# Patient Record
Sex: Female | Born: 1960 | Race: White | Hispanic: No | Marital: Married | Smoking: Former smoker
Health system: Southern US, Community
[De-identification: ages and names within clinical notes are randomized; demographics above are authoritative.]

## PROBLEM LIST (undated history)

## (undated) DIAGNOSIS — I059 Rheumatic mitral valve disease, unspecified: Secondary | ICD-10-CM

## (undated) HISTORY — PX: CARDIAC SURGERY: SHX584

## (undated) HISTORY — PX: OTHER SURGICAL HISTORY: SHX169

---

## 2017-03-30 ENCOUNTER — Emergency Department (HOSPITAL_COMMUNITY): Payer: Managed Care, Other (non HMO)

## 2017-03-30 ENCOUNTER — Inpatient Hospital Stay (HOSPITAL_COMMUNITY)
Admission: EM | Admit: 2017-03-30 | Discharge: 2017-04-11 | DRG: 216 | Disposition: A | Discharge status: Home / Self Care | Payer: Managed Care, Other (non HMO) | Attending: Surgery | Admitting: Surgery

## 2017-03-30 ENCOUNTER — Encounter (HOSPITAL_COMMUNITY): Payer: Self-pay | Admitting: Emergency Medicine

## 2017-03-30 ENCOUNTER — Other Ambulatory Visit: Payer: Self-pay

## 2017-03-30 DIAGNOSIS — I481 Persistent atrial fibrillation: Secondary | ICD-10-CM | POA: Diagnosis not present

## 2017-03-30 DIAGNOSIS — I342 Nonrheumatic mitral (valve) stenosis: Secondary | ICD-10-CM | POA: Diagnosis not present

## 2017-03-30 DIAGNOSIS — D696 Thrombocytopenia, unspecified: Secondary | ICD-10-CM | POA: Diagnosis present

## 2017-03-30 DIAGNOSIS — R0602 Shortness of breath: Secondary | ICD-10-CM | POA: Diagnosis not present

## 2017-03-30 DIAGNOSIS — E119 Type 2 diabetes mellitus without complications: Secondary | ICD-10-CM | POA: Diagnosis present

## 2017-03-30 DIAGNOSIS — D62 Acute posthemorrhagic anemia: Secondary | ICD-10-CM | POA: Diagnosis not present

## 2017-03-30 DIAGNOSIS — F17210 Nicotine dependence, cigarettes, uncomplicated: Secondary | ICD-10-CM | POA: Diagnosis present

## 2017-03-30 DIAGNOSIS — I099 Rheumatic heart disease, unspecified: Secondary | ICD-10-CM

## 2017-03-30 DIAGNOSIS — J9811 Atelectasis: Secondary | ICD-10-CM | POA: Diagnosis present

## 2017-03-30 DIAGNOSIS — Z952 Presence of prosthetic heart valve: Secondary | ICD-10-CM

## 2017-03-30 DIAGNOSIS — I480 Paroxysmal atrial fibrillation: Secondary | ICD-10-CM | POA: Diagnosis not present

## 2017-03-30 DIAGNOSIS — I4892 Unspecified atrial flutter: Secondary | ICD-10-CM

## 2017-03-30 DIAGNOSIS — I361 Nonrheumatic tricuspid (valve) insufficiency: Secondary | ICD-10-CM | POA: Diagnosis not present

## 2017-03-30 DIAGNOSIS — I350 Nonrheumatic aortic (valve) stenosis: Secondary | ICD-10-CM | POA: Diagnosis not present

## 2017-03-30 DIAGNOSIS — I050 Rheumatic mitral stenosis: Secondary | ICD-10-CM

## 2017-03-30 DIAGNOSIS — I5033 Acute on chronic diastolic (congestive) heart failure: Secondary | ICD-10-CM | POA: Diagnosis present

## 2017-03-30 DIAGNOSIS — I351 Nonrheumatic aortic (valve) insufficiency: Secondary | ICD-10-CM | POA: Diagnosis not present

## 2017-03-30 DIAGNOSIS — I080 Rheumatic disorders of both mitral and aortic valves: Secondary | ICD-10-CM | POA: Diagnosis present

## 2017-03-30 DIAGNOSIS — I4891 Unspecified atrial fibrillation: Secondary | ICD-10-CM | POA: Diagnosis present

## 2017-03-30 DIAGNOSIS — I272 Pulmonary hypertension, unspecified: Secondary | ICD-10-CM | POA: Diagnosis present

## 2017-03-30 DIAGNOSIS — Z01810 Encounter for preprocedural cardiovascular examination: Secondary | ICD-10-CM | POA: Diagnosis not present

## 2017-03-30 DIAGNOSIS — I051 Rheumatic mitral insufficiency: Secondary | ICD-10-CM

## 2017-03-30 DIAGNOSIS — I340 Nonrheumatic mitral (valve) insufficiency: Secondary | ICD-10-CM | POA: Diagnosis not present

## 2017-03-30 DIAGNOSIS — I482 Chronic atrial fibrillation: Secondary | ICD-10-CM | POA: Diagnosis not present

## 2017-03-30 DIAGNOSIS — J939 Pneumothorax, unspecified: Secondary | ICD-10-CM

## 2017-03-30 DIAGNOSIS — Z8249 Family history of ischemic heart disease and other diseases of the circulatory system: Secondary | ICD-10-CM | POA: Diagnosis not present

## 2017-03-30 HISTORY — DX: Rheumatic mitral valve disease, unspecified: I05.9

## 2017-03-30 LAB — CBC
HCT: 37.2 % (ref 36.0–46.0)
Hemoglobin: 12.2 g/dL (ref 12.0–15.0)
MCH: 30.4 pg (ref 26.0–34.0)
MCHC: 32.8 g/dL (ref 30.0–36.0)
MCV: 92.8 fL (ref 78.0–100.0)
Platelets: 235 10*3/uL (ref 150–400)
RBC: 4.01 MIL/uL (ref 3.87–5.11)
RDW: 13.3 % (ref 11.5–15.5)
WBC: 8.5 10*3/uL (ref 4.0–10.5)

## 2017-03-30 LAB — BASIC METABOLIC PANEL
Anion gap: 13 (ref 5–15)
BUN: 20 mg/dL (ref 6–20)
CO2: 18 mmol/L — ABNORMAL LOW (ref 22–32)
Calcium: 9 mg/dL (ref 8.9–10.3)
Chloride: 105 mmol/L (ref 101–111)
Creatinine, Ser: 0.98 mg/dL (ref 0.44–1.00)
GFR calc Af Amer: >60 mL/min (ref >60)
GFR calc non Af Amer: >60 mL/min (ref >60)
Glucose, Bld: 138 mg/dL — ABNORMAL HIGH (ref 65–99)
Potassium: 4.2 mmol/L (ref 3.5–5.1)
Sodium: 136 mmol/L (ref 135–145)

## 2017-03-30 LAB — MAGNESIUM: Magnesium: 2 mg/dL (ref 1.7–2.4)

## 2017-03-30 LAB — HEMOGLOBIN A1C
Hgb A1c MFr Bld: 5.3 % (ref 4.8–5.6)
Mean Plasma Glucose: 105.41 mg/dL

## 2017-03-30 LAB — I-STAT BETA HCG BLOOD, ED (MC, WL, AP ONLY): I-stat hCG, quantitative: <5 m[IU]/mL (ref <5)

## 2017-03-30 LAB — HEPARIN LEVEL (UNFRACTIONATED): Heparin Unfractionated: 0.15 IU/mL — ABNORMAL LOW (ref 0.30–0.70)

## 2017-03-30 LAB — TSH: TSH: 3.8 u[IU]/mL (ref 0.350–4.500)

## 2017-03-30 LAB — I-STAT TROPONIN, ED: Troponin i, poc: 0.01 ng/mL (ref 0.00–0.08)

## 2017-03-30 MED ORDER — SODIUM CHLORIDE 0.9 % IV BOLUS (SEPSIS)
500.0000 mL | Freq: Once | INTRAVENOUS | Status: AC
  Administered 2017-03-30: 500 mL via INTRAVENOUS

## 2017-03-30 MED ORDER — ACETAMINOPHEN 325 MG PO TABS
650.0000 mg | ORAL_TABLET | ORAL | Status: DC | PRN
  Administered 2017-03-31: 650 mg via ORAL
  Filled 2017-03-30: qty 2

## 2017-03-30 MED ORDER — FUROSEMIDE 10 MG/ML IJ SOLN
20.0000 mg | Freq: Two times a day (BID) | INTRAMUSCULAR | Status: DC
  Administered 2017-03-30 – 2017-03-31 (×3): 20 mg via INTRAVENOUS
  Filled 2017-03-30 (×3): qty 2

## 2017-03-30 MED ORDER — ONDANSETRON HCL 4 MG/2ML IJ SOLN: 4.0000 mg | Freq: Four times a day (QID) | INTRAMUSCULAR | Status: DC | PRN

## 2017-03-30 MED ORDER — DILTIAZEM HCL-DEXTROSE 100-5 MG/100ML-% IV SOLN (PREMIX)
5.0000 mg/h | INTRAVENOUS | Status: DC
  Administered 2017-03-30: 5 mg/h via INTRAVENOUS
  Filled 2017-03-30 (×2): qty 100

## 2017-03-30 MED ORDER — DILTIAZEM LOAD VIA INFUSION
10.0000 mg | Freq: Once | INTRAVENOUS | Status: AC
  Administered 2017-03-30: 10 mg via INTRAVENOUS
  Filled 2017-03-30: qty 10

## 2017-03-30 MED ORDER — HEPARIN (PORCINE) IN NACL 100-0.45 UNIT/ML-% IJ SOLN
1250.0000 [IU]/h | INTRAMUSCULAR | Status: DC
Start: 2017-03-30 — End: 2017-03-31

## 2017-03-30 MED ORDER — HEPARIN BOLUS VIA INFUSION
3000.0000 [IU] | Freq: Once | INTRAVENOUS | Status: AC
Start: 2017-03-30 — End: 2017-03-30
  Administered 2017-03-30: 3000 [IU] via INTRAVENOUS
  Filled 2017-03-30: qty 3000

## 2017-03-30 MED ORDER — HEPARIN (PORCINE) IN NACL 100-0.45 UNIT/ML-% IJ SOLN
850.0000 [IU]/h | INTRAMUSCULAR | Status: DC
  Administered 2017-03-30: 850 [IU]/h via INTRAVENOUS
  Filled 2017-03-30 (×2): qty 250

## 2017-03-30 NOTE — ED Provider Notes (Signed)
[facility] EMERGENCY DEPARTMENT Provider Note   CSN: [id number] Arrival date & time: 03/30/17  1146     History   Chief Complaint Chief Complaint  Patient presents with  . Shortness of Breath    HPI Sharon Figueroa is a 57 y.o. female.  HPI Patient presents with shortness of breath for the last around 5 days.  Has had a little bit of a cough with no production.  Some chest heaviness.  Seen at urgent care and sent here for new onset A. fib.  No swelling in her legs.  No fevers.  States she is been more fatigued.  Has had a dull chest pain.  Does not really see a doctor but states she has no real medical problems.  No recent weight loss. History reviewed. No pertinent past medical history.  There are no active problems to display for this patient.   History reviewed. No pertinent surgical history.  OB History    No data available       Home Medications    Prior to Admission medications   Not on File    Family History No family history on file.  Social History Social History   Tobacco Use  . Smoking status: Current Every Day Smoker    Packs/day: 1.00    Types: Cigarettes  . Smokeless tobacco: Never Used  Substance Use Topics  . Alcohol use: No    Frequency: Never  . Drug use: No     Allergies   Patient has no known allergies.   Review of Systems Review of Systems  Constitutional: Negative for fever.  HENT: Negative for congestion.   Eyes: Negative for photophobia.  Respiratory: Positive for cough and shortness of breath.   Cardiovascular: Negative for chest pain.  Gastrointestinal: Negative for abdominal pain.  Genitourinary: Negative for flank pain.  Musculoskeletal: Negative for back pain.  Neurological: Negative for syncope.  Psychiatric/Behavioral: Negative for confusion.     Physical Exam Updated Vital Signs BP 116/67   Pulse 87   Temp 97.6 F (36.4 C) (Oral)   Resp (!) 22   Ht 5\' 6"  (1.676 m)   Wt 60.8 kg (134  lb)   SpO2 94%   BMI 21.63 kg/m   Physical Exam  Constitutional: She appears well-developed.  HENT:  Head: Normocephalic.  Eyes: EOM are normal.  Cardiovascular:  Irregular tachycardia  Pulmonary/Chest: Effort normal. She exhibits no mass and no tenderness.  Mildly harsh breath sounds  Abdominal: There is no tenderness.  Musculoskeletal:       Right lower leg: She exhibits edema.       Left lower leg: She exhibits edema.  Mild bilateral lower extremity pitting edema  Neurological: She is alert.  Skin: Skin is warm. Capillary refill takes less than 2 seconds.     ED Treatments / Results  Labs (all labs ordered are listed, but only abnormal results are displayed) Labs Reviewed  BASIC METABOLIC PANEL - Abnormal; Notable for the following components:      Result Value   CO2 18 (*)    Glucose, Bld 138 (*)    All other components within normal limits  CBC  TSH  MAGNESIUM  I-STAT TROPONIN, ED  I-STAT BETA HCG BLOOD, ED ([facility], [facility], [facility] ONLY)    EKG  EKG Interpretation  Date/Time:  Saturday March 30 2017 11:55:36 EST Ventricular Rate:  141 PR Interval:    QRS Duration: 86 QT Interval:  274 QTC Calculation: 419 R Axis:  102 Text Interpretation:  Atrial flutter with rapid ventricular response with premature ventricular or aberrantly conducted complexes Rightward axis Minimal voltage criteria for LVH, may be normal variant Septal infarct , age undetermined Abnormal ECG No old tracing to compare Reconfirmed by Benjiman CorePickering, Mikeila Burgen [phone]([id number]) on 03/30/2017 12:48:23 PM       Radiology Dg Chest 2 View  Result Date: 03/30/2017 CLINICAL DATA:  Shortness of breath.  Chest tightness. EXAM: CHEST  2 VIEW COMPARISON:  None. FINDINGS: Cardiomegaly. The hila and mediastinum are normal. Increased interstitial markings in the lungs, most prominent in the bases. Small effusions based on the lateral view. No other acute abnormalities. IMPRESSION: Pulmonary edema.  Small effusions.  Electronically Signed   By: Gerome Samavid  Williams III M.D   On: 03/30/2017 12:34    Procedures Procedures (including critical care time)  Medications Ordered in ED Medications  diltiazem (CARDIZEM) 1 mg/mL load via infusion 10 mg (10 mg Intravenous Bolus from Bag 03/30/17 1245)    And  diltiazem (CARDIZEM) 100 mg in dextrose 5% 100mL (1 mg/mL) infusion (5 mg/hr Intravenous New Bag/Given 03/30/17 1246)  furosemide (LASIX) injection 20 mg (not administered)  sodium chloride 0.9 % bolus 500 mL (0 mLs Intravenous Stopped 03/30/17 1352)     Initial Impression / Assessment and Plan / ED Course  I have reviewed the triage vital signs and the nursing notes.  Pertinent labs & imaging results that were available during my care of the patient were reviewed by me and considered in my medical decision making (see chart for details).     Patient with atrial flutter with RVR.  New onset and likely began around 6 days ago.  Not a candidate for urgent cardioversion due to this.  Appears to be somewhat volume overloaded.  ChadsVASC score of 1 for age.. Patient is requiring IV Cardizem for rate control.  Will admit to cardiologist.  CRITICAL CARE Performed by: Benjiman CoreNathan Tien Aispuro Total critical care time:30 minutes Critical care time was exclusive of separately billable procedures and treating other patients. Critical care was necessary to treat or prevent imminent or life-threatening deterioration. Critical care was time spent personally by me on the following activities: development of treatment plan with patient and/or surrogate as well as nursing, discussions with consultants, evaluation of patient's response to treatment, examination of patient, obtaining history from patient or surrogate, ordering and performing treatments and interventions, ordering and review of laboratory studies, ordering and review of radiographic studies, pulse oximetry and re-evaluation of patient's condition.  Final Clinical  Impressions(s) / ED Diagnoses   Final diagnoses:  Atrial flutter with rapid ventricular response [facility](HCC)    ED Discharge Orders    None       Benjiman CorePickering, Micco Bourbeau, MD 03/30/17 1448

## 2017-03-30 NOTE — Plan of Care (Signed)
  Pain Managment: General experience of comfort will improve 03/30/2017 1743 - Completed/Met by Shanon Rosser, RN

## 2017-03-30 NOTE — ED Notes (Signed)
Ordered dinner tray for pt.  

## 2017-03-30 NOTE — H&P (Signed)
Cardiology Consultation:   Patient ID: Sharon Figueroa; [id number]; 04/21/1960   Admit date: 03/30/2017 Date of Consult: 03/30/2017  Primary Care Provider: Patient, No Pcp Per Primary Cardiologist: New; Dr Jens Somrenshaw   Patient Profile:   Sharon Figueroa is a 57 y.o. female with new onset atrial fibrillation.  History of Present Illness:   Patient states that she has had a murmur in the past and years ago was followed with echocardiograms for mitral regurgitation.  [place] and she has not had an echo in greater than 10 years.  Over the preceding 4 days she notes worsening dyspnea on exertion, orthopnea, PND and chest heaviness with lying flat.  No pedal edema, palpitations or syncope.  She presented to the emergency room and found to be in atrial fibrillation and cardiology asked to evaluate.  Prior to the preceding 4 days she was asymptomatic.  Past Medical History:  Diagnosis Date  . 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 No prior surgery        Inpatient Medications: Scheduled Meds: . furosemide  20 mg Intravenous BID   Continuous Infusions: . diltiazem (CARDIZEM) infusion 5 mg/hr (03/30/17 1246)   PRN Meds:   Allergies:   No Known Allergies  Social History:   Social History   Socioeconomic History  . Marital status: Single    Spouse name: Not on file  . Number of children: Not on file  . Years of education: Not on file  . Highest education level: Not on file  Social Needs  . Financial resource strain: Not on file  . Food insecurity - worry: Not on file  . Food insecurity - inability: Not on file  . Transportation needs - medical: Not on file  . Transportation needs - non-medical: Not on file  Occupational History  . Not on file  Tobacco Use  . Smoking status: Current Every Day Smoker    Packs/day: 1.00    Types: Cigarettes  . Smokeless tobacco: Never Used  Substance  and Sexual Activity  . Alcohol use: No    Frequency: Never  . Drug use: No  . Sexual activity: Not on file  Other Topics Concern  . Not on file  Social History Narrative  . Not on file    Family History:    Family History  Problem Relation Age of Onset  . CVA Father   . Heart attack Father      ROS:  Please see the history of present illness.  Patient denies fevers, chills, productive cough, hemoptysis, dysphagia, melena or hematochezia. All other ROS reviewed and negative.     Physical Exam/Data:   Vitals:   03/30/17 1234 03/30/17 1300 03/30/17 1315 03/30/17 1345  BP: (!) 94/58 106/67 (!) 97/58 116/67  Pulse: (!) 48 91 96 87  Resp: (!) 22 (!) 24 (!) 22 (!) 22  Temp:      TempSrc:      SpO2: 100% 96% 97% 94%  Weight:      Height:        Intake/Output Summary (Last 24 hours) at 03/30/2017 1513 Last data filed at 03/30/2017 1352 Gross per 24 hour  Intake 500 ml  Output -  Net 500 ml   Filed Weights   03/30/17 1200  Weight: 134 lb (60.8 kg)   Body mass index is 21.63 kg/m.  General:  Well nourished, well developed, in  no acute distress HEENT: normal Lymph: no adenopathy Neck: no JVD Endocrine:  No thryomegaly Vascular: No carotid bruits; FA pulses 2+ bilaterally without bruits  Cardiac:  normal S1, S2; irregular, tachycardic.  No murmur. Lungs: Mild basilar crackles. Abd: soft, nontender, no hepatomegaly  Ext: no edema Musculoskeletal:  No deformities, BUE and BLE strength normal and equal Skin: warm and dry  Neuro:  CNs 2-12 intact, no focal abnormalities noted Psych:  Normal affect   EKG:  The EKG was personally reviewed and demonstrates: Atrial fibrillation with rapid ventricular response, right axis deviation, left ventricular hypertrophy, nonspecific ST changes.   Laboratory Data:  Chemistry Recent Labs  Lab 03/30/17 1207  NA 136  K 4.2  CL 105  CO2 18*  GLUCOSE 138*  BUN 20  CREATININE 0.98  CALCIUM 9.0  GFRNONAA >60  GFRAA >60    ANIONGAP 13    Hematology Recent Labs  Lab 03/30/17 1207  WBC 8.5  RBC 4.01  HGB 12.2  HCT 37.2  MCV 92.8  MCH 30.4  MCHC 32.8  RDW 13.3  PLT 235    Recent Labs  Lab 03/30/17 1216  TROPIPOC 0.01     Radiology/Studies:  Dg Chest 2 View  Result Date: 03/30/2017 CLINICAL DATA:  Shortness of breath.  Chest tightness. EXAM: CHEST  2 VIEW COMPARISON:  None. FINDINGS: Cardiomegaly. The hila and mediastinum are normal. Increased interstitial markings in the lungs, most prominent in the bases. Small effusions based on the lateral view. No other acute abnormalities. IMPRESSION: Pulmonary edema.  Small effusions. Electronically Signed   By: Gerome Sam III M.D   On: 03/30/2017 12:34    Assessment and Plan:   1. New-onset atrial fibrillation-patient presents with newly diagnosed atrial fibrillation.  We will control heart rate with Cardizem.  Check TSH and echocardiogram.  Embolic risk factors include female sex and congestive heart failure.  Begin IV heparin and will likely need long-term apixaban.  If we can control heart rate and symptoms improve we could consider cardioversion 4 weeks after therapeutic anticoagulation.  Otherwise would need to consider TEE guided cardioversion.  At 2. History of mitral regurgitation-patient states she has had mitral regurgitation which was followed in the past with serial echocardiograms.  She has not had one in 10 years and has not followed up with a cardiologist.  I do not hear a significant murmur on examination.  However I am concerned that mitral regurgitation may be significant or causing left atrial enlargement and atrial fibrillation.  We will arrange an echocardiogram to further assess.  Can consider TEE if needed. 3. Acute diastolic congestive heart failure-patient with volume excess.  Will diurese with Lasix 20 mg IV twice daily.  Follow renal function closely.  CHF likely secondary to new onset atrial fibrillation. 4. Tobacco abuse-patient  counseled on discontinuing.   For questions or updates, please contact [facility] Please consult [email or web address] for contact info under Cardiology/STEMI.   Signed, Olga Millers, MD  03/30/2017 3:13 PM

## 2017-03-30 NOTE — Progress Notes (Signed)
ANTICOAGULATION CONSULT NOTE - Initial Consult  Pharmacy Consult for Heparin Indication: atrial fibrillation  No Known Allergies  Patient Measurements: Height: 5\' 6"  (167.6 cm) Weight: 134 lb (60.8 kg) IBW/kg (Calculated) : 59.3 Heparin Dosing Weight: 60 kg  Vital Signs: Temp: 97.6 F (36.4 C) (01/26 1158) Temp Source: Oral (01/26 1158) BP: 106/59 (01/26 1530) Pulse Rate: 85 (01/26 1530)  Labs: Recent Labs    03/30/17 1207  HGB 12.2  HCT 37.2  PLT 235  CREATININE 0.98    Estimated Creatinine Clearance: 58.6 mL/min (by C-G formula based on SCr of 0.98 mg/dL).   Medical History: Past Medical History:  Diagnosis Date  . Mitral valve disorder    "leaky" valve diagnosed 30+ yr ago    Assessment: Sharon Figueroa who presented on 1/26 with increased SOB with exertion and was found to have new Afib. Cardiology consulted and considered TEE-DCCV in 4 weeks after anticoagulation. Pharmacy consulted to start Heparin for anticoagulation with plans like to eventually transition to Apixaban.   The patient was not on any medications PTA and has no hx CVA or recent surgeries. Baseline CBC wnl.   Goal of Therapy:  Heparin level 0.3-0.7 units/ml Monitor platelets by anticoagulation protocol: Yes   Plan:  1. Heparin 3000 units bolus x 1 2. Start Heparin at a drip rate of 850 units/hr (8.5 ml/hr) 3. Daily HL, CBC 4. Will continue to monitor for any signs/symptoms of bleeding and will follow up with heparin level in 6 hours   Thank you for allowing pharmacy to be a part of this patient's care.  Georgina PillionElizabeth Holten Spano, PharmD, BCPS Clinical Pharmacist Pager: [phone] Clinical phone for 03/30/2017 from 7a-3:30p: [phone] If after 3:30p, please call main pharmacy at: [phone] 03/30/2017 3:56 PM

## 2017-03-30 NOTE — Progress Notes (Signed)
ANTICOAGULATION CONSULT NOTE - Initial Consult  Pharmacy Consult for Heparin Indication: atrial fibrillation  No Known Allergies  Patient Measurements: Height: 5\' 6"  (167.6 cm) Weight: 132 lb 8 oz (60.1 kg) IBW/kg (Calculated) : 59.3 Heparin Dosing Weight: 60 kg  Vital Signs: Temp: 99.2 F (37.3 C) (01/26 2131) Temp Source: Oral (01/26 2131) BP: 97/56 (01/26 2131) Pulse Rate: 83 (01/26 1735)  Labs: Recent Labs    03/30/17 1207 03/30/17 2225  HGB 12.2  --   HCT 37.2  --   PLT 235  --   HEPARINUNFRC  --  0.15*  CREATININE 0.98  --     Estimated Creatinine Clearance: 58.6 mL/min (by C-G formula based on SCr of 0.98 mg/dL).   Medical History: Past Medical History:  Diagnosis Date  . Mitral valve disorder    "leaky" valve diagnosed 30+ yr ago    Assessment: [id number] YOF who presented on 1/26 with increased SOB with exertion and was found to have new Afib. Cardiology consulted and considered TEE-DCCV in 4 weeks after anticoagulation. Pharmacy consulted to start Heparin for anticoagulation with plans like to eventually transition to Apixaban.   Initial heparin level is SUBtherapeutic.  Goal of Therapy:  Heparin level 0.3-0.7 units/ml Monitor platelets by anticoagulation protocol: Yes   Plan:   Increase heparin to 1050 units/hr  Daily HL, CBC  Check level in 6 hours   Baldemar FridayMasters, Azaryah Heathcock M  03/30/2017 11:14 PM

## 2017-03-30 NOTE — H&P (Signed)
Cardiology Admission History and Physical:   Patient ID: Adiya Selmer; MRN: [id number]; DOB: 1960-10-19   Admission date: 03/30/2017  Primary Care Provider: None Primary Cardiologist: New to [facility]; Dr. Jens Som Primary Electrophysiologist:  None  Chief Complaint:  SOB   Patient Profile:   Tanyiah Laurich is a 57 y.o. female with a history of a "leaky" mitral valve, presents to [facility] ED with complaints of SOB x4 days. Found to be in Afib RVR. Admitted to cardiology for further management.  History of Present Illness:   Ms. Gully is a 57 y.o. female with a history of a "leaky" mitral valve, presents with progressively worsening SOB for the past 4 days.  She states she was in her usual state of health until 4 days ago when she noticed DOE. She states her SOB got progressively worse over the next few days, to the point that she was dyspneic with minimal activity. On the evening of 03/29/17, she felt SOB at rest with associated orthopnea and chest tightness prompting her to present to an [facility] the following morning. She was found to have an elevated HR and recommended to present to the ED. She denied feeling like her heart was racing or palpitations. Prior to these events, she denies CP, DOE, orthopnea, PND, LE edema, palpitations. She denies chronic medical problems including HTN, HLD, DM, stroke, MI, thyroid disorders. She reports a history of a "leaky" mitral valve, diagnosed >41yr ago during a w/u for syncope, which has not been evaluated in over 10 years. She has not been evaluated by a medical provider since moving to [place] 13 years ago.   ROS negative for bleeding disorders, hematochezia, melena, hematuria, abdominal pain, recent URI, fevers.   ED course: HR max 135, BP stable, RR elevated, satting okay on RA. Labs notable for normal CBC, Cr 0.98, glucose 138, electrolytes wnl, trop negative x1. EKG with Afib RVR, rate 141. CXR with pulmonary edema. Patient started on a diltiazem gtt.  Admitted to cardiology for further management of afib and pulmonary edema.   Past Medical History:  Diagnosis Date  . 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 No prior surgery       Medications Prior to Admission: Prior to Admission medications   Not on File     Allergies:   No Known Allergies  Social History:   Social History   Socioeconomic History  . Marital status: Married    Spouse name: Not on file  . Number of children: 3  . Years of education: Not on file  . Highest education level: Not on file  Social Needs  . Financial resource strain: Not on file  . Food insecurity - worry: Not on file  . Food insecurity - inability: Not on file  . Transportation needs - medical: Not on file  . Transportation needs - non-medical: Not on file  Occupational History  . Not on file  Tobacco Use  . Smoking status: Current Every Day Smoker    Packs/day: 1.00    Types: Cigarettes  . Smokeless tobacco: Never Used  Substance and Sexual Activity  . Alcohol use: No    Frequency: Never  . Drug use: No  . Sexual activity: Not on file  Other Topics Concern  . Not on file  Social History Narrative  . Not on file    Family History:   The patient's family history includes  CVA in her father; Heart attack in her father.    ROS:  Please see the history of present illness.  All other ROS reviewed and negative.     Physical Exam/Data:   Vitals:   03/30/17 1234 03/30/17 1300 03/30/17 1315 03/30/17 1345  BP: (!) 94/58 106/67 (!) 97/58 116/67  Pulse: (!) 48 91 96 87  Resp: (!) 22 (!) 24 (!) 22 (!) 22  Temp:      TempSrc:      SpO2: 100% 96% 97% 94%  Weight:      Height:        Intake/Output Summary (Last 24 hours) at 03/30/2017 1517 Last data filed at 03/30/2017 1352 Gross per 24 hour  Intake 500 ml  Output -  Net 500 ml   Filed Weights   03/30/17 1200  Weight: 134 lb (60.8 kg)   Body mass  index is 21.63 kg/m.  General:  Well nourished, well developed, laying in bed in no acute distress HEENT: normal Neck: no JVD Endocrine:  No thryomegaly Vascular: No carotid bruits; distal pulses 2+ b/l Cardiac:  normal S1, S2; irregular rate/rhythm, no murmur/gallops/rubs appreciated Lungs:  Crackles at bases b/l, no wheezing or rhonchi  Abd: soft, nontender, no hepatomegaly  Ext: no edema Musculoskeletal:  No deformities, BUE and BLE strength normal and equal Skin: warm and dry  Neuro:  CNs 2-12 intact, no focal abnormalities noted Psych:  Normal affect   EKG:  The ECG that was done 03/30/17 was personally reviewed and demonstrates Atrial fibrillation with RVR, rate 141.   Telemetry: Afib with rate 90s-130s  Relevant CV Studies: Echo pending  Laboratory Data:  Chemistry Recent Labs  Lab 03/30/17 1207  NA 136  K 4.2  CL 105  CO2 18*  GLUCOSE 138*  BUN 20  CREATININE 0.98  CALCIUM 9.0  GFRNONAA >60  GFRAA >60  ANIONGAP 13    No results for input(s): PROT, ALBUMIN, AST, ALT, ALKPHOS, BILITOT in the last 168 hours. Hematology Recent Labs  Lab 03/30/17 1207  WBC 8.5  RBC 4.01  HGB 12.2  HCT 37.2  MCV 92.8  MCH 30.4  MCHC 32.8  RDW 13.3  PLT 235   Cardiac EnzymesNo results for input(s): TROPONINI in the last 168 hours.  Recent Labs  Lab 03/30/17 1216  TROPIPOC 0.01    BNPNo results for input(s): BNP, PROBNP in the last 168 hours.  DDimer No results for input(s): DDIMER in the last 168 hours.  Radiology/Studies:  Dg Chest 2 View  Result Date: 03/30/2017 CLINICAL DATA:  Shortness of breath.  Chest tightness. EXAM: CHEST  2 VIEW COMPARISON:  None. FINDINGS: Cardiomegaly. The hila and mediastinum are normal. Increased interstitial markings in the lungs, most prominent in the bases. Small effusions based on the lateral view. No other acute abnormalities. IMPRESSION: Pulmonary edema.  Small effusions. Electronically Signed   By: Gerome Sam III M.D   On:  03/30/2017 12:34    Assessment and Plan:   1. Atrial fibrillation: patient with 4 days of progressive SOB associated with chest pressure and orthopnea. Presented to [facility] 03/30/17, found to be in afib RVR, and recommended to present to ED.  - EKG with Afib RVR w rate 140s - TSH pending2 - This patients CHA2DS2-VASc Score and unadjusted Ischemic Stroke Rate (% per year) is equal to 0.6 % stroke rate/year from a score of 1, possibly higher pending echocardiogram and HgbA1C Above score calculated as 1 point each if present [CHF, HTN,  DM, Vascular=MI/PAD/Aortic Plaque, Age if 2165-74, or Female] Above score calculated as 2 points each if present [Age > 75, or Stroke/TIA/TE] - Will check echo to evaluate for possible structural abnormalities which may have contributed to her presentation - Will check HgbA1C for risk stratification - Start heparin gtt per pharmacy - will likely transition to DOAC at discharge.  2. Volume overload: patient reports a history of a "leaky" mitral valve, diagnosed >8060yr ago after presenting with a syncopal episode, which has not been evaluated in over 10 years. Possible she has had progression of valvular disease causing pulmonary vascular congestion.  - CXR with pulmonary vascular congestion - Will start IV lasix 20mg  BID - Will check echocardiogram   - Strict I&Os and daily weights  3. Chest pressure: likely 2/2 afib presentation. Patient denies anginal complaints prior to 4 days ago. Currently chest pain free. - Trop negative on admission - no need to trend further  4. Tobacco abuse:  - Counseled on smoking cessation  - Can give nicotine patch if needed.    Severity of Illness: The appropriate patient status for this patient is INPATIENT. Inpatient status is judged to be reasonable and necessary in order to provide the required intensity of service to ensure the patient's safety. The patient's presenting symptoms, physical exam findings, and initial radiographic and  laboratory data in the context of their chronic comorbidities is felt to place them at high risk for further clinical deterioration. Furthermore, it is not anticipated that the patient will be medically stable for discharge from the hospital within 2 midnights of admission. The following factors support the patient status of inpatient.   " The patient's presenting symptoms include SOB and chest pressure. " The worrisome physical exam findings include irregular rate/rhythm; crackles at bases of lungs. " The initial radiographic and laboratory data are worrisome because of EKG with afib RVR; CXR with pulmonary vascular congestion. " The chronic co-morbidities include no known comorbidities.   * I certify that at the point of admission it is my clinical judgment that the patient will require inpatient hospital care spanning beyond 2 midnights from the point of admission due to high intensity of service, high risk for further deterioration and high frequency of surveillance required.*    For questions or updates, please contact [facility] Please consult [email or web address] for contact info under Cardiology/STEMI.    Signed, Beatriz StallionKrista M. Kroeger, PA-C  03/30/2017 3:17 PM  See separate Rexene EdisonH and Demetrius CharityP Olga MillersBrian Aaron Boeh

## 2017-03-30 NOTE — ED Triage Notes (Signed)
[facility] new onset A-Fib/A-flutter. Patient reports increasing SOB with little exertion this past week, shallow breathing, states she thought it was an upper respiratory infection but denies fevers/chills/cough. Patient also endorses minor chest heaviness, non-radiating. Denies dizziness/lightheadedness. Resp e/u, skin warm/dry.

## 2017-03-31 ENCOUNTER — Inpatient Hospital Stay (HOSPITAL_COMMUNITY): Payer: Managed Care, Other (non HMO)

## 2017-03-31 DIAGNOSIS — I361 Nonrheumatic tricuspid (valve) insufficiency: Secondary | ICD-10-CM

## 2017-03-31 LAB — ECHOCARDIOGRAM COMPLETE
AO mean calculated velocity dopler: 142 cm/s
AV Area VTI index: 1.01 cm2/m2
AV Area VTI: 1.38 cm2
AV Area mean vel: 1.51 cm2
AV Mean grad: 10 mmHg
AV Peak grad: 22 mmHg
AV VEL mean LVOT/AV: 0.44
AV area mean vel ind: 0.9 cm2/m2
AV peak Index: 0.83
AV pk vel: 237 cm/s
AV vel: 1.68
Ao pk vel: 0.4 m/s
Ao-asc: 29 cm
Area-P 1/2: 0.81 cm2
E decel time: 810 msec
FS: 20 % — AB (ref 28–44)
Height: 66 in
IVS/LV PW RATIO, ED: 0.76
LA ID, A-P, ES: 51 mm
LA diam end sys: 51 mm
LA diam index: 3.05 cm/m2
LA vol A4C: 75.3 ml
LV PW d: 16.6 mm — AB (ref 0.6–1.1)
LV dias vol index: 57 mL/m2
LV dias vol: 95 mL (ref 46–106)
LV sys vol index: 38 mL/m2
LV sys vol: 64 mL — AB (ref 14–42)
LVOT MV VTI INDEX: 0.33 cm2/m2
LVOT MV VTI: 0.55
LVOT SV: 60 mL
LVOT VTI: 17.3 cm
LVOT area: 3.46 cm2
LVOT diameter: 21 mm
LVOT peak VTI: 0.49 cm
LVOT peak grad rest: 4 mmHg
LVOT peak vel: 94.6 cm/s
MV Annulus VTI: 109 cm
MV Dec: 810
MV M vel: 212
MV Peak grad: 31 mmHg
MV pk E vel: 277 m/s
Mean grad: 23 mmHg
P 1/2 time: 237 ms
P 1/2 time: 329 ms
PISA EROA: 0.79 cm2
RV sys press: 72 mmHg
Reg peak vel: 379 cm/s
Simpson's disk: 33
Stroke v: 31 ml
TAPSE: 11.1 mm
TR max vel: 379 cm/s
VTI: 118 cm
VTI: 35.6 cm
Valve area index: 1.01
Valve area: 1.68 cm2
Weight: 2081.6 oz

## 2017-03-31 LAB — CBC
HCT: 33.2 % — ABNORMAL LOW (ref 36.0–46.0)
Hemoglobin: 10.8 g/dL — ABNORMAL LOW (ref 12.0–15.0)
MCH: 30.5 pg (ref 26.0–34.0)
MCHC: 32.5 g/dL (ref 30.0–36.0)
MCV: 93.8 fL (ref 78.0–100.0)
Platelets: 190 10*3/uL (ref 150–400)
RBC: 3.54 MIL/uL — ABNORMAL LOW (ref 3.87–5.11)
RDW: 13.7 % (ref 11.5–15.5)
WBC: 4.7 10*3/uL (ref 4.0–10.5)

## 2017-03-31 LAB — HEPARIN LEVEL (UNFRACTIONATED): Heparin Unfractionated: 0.2 IU/mL — ABNORMAL LOW (ref 0.30–0.70)

## 2017-03-31 LAB — HIV ANTIBODY (ROUTINE TESTING W REFLEX): HIV Screen 4th Generation wRfx: NONREACTIVE

## 2017-03-31 MED ORDER — OFF THE BEAT BOOK
Freq: Once | Status: AC
Start: 2017-03-31 — End: 2017-03-31
  Administered 2017-03-31: 07:00:00
  Filled 2017-03-31: qty 1

## 2017-03-31 MED ORDER — SODIUM CHLORIDE 0.9 % IV SOLN: INTRAVENOUS | Status: DC

## 2017-03-31 MED ORDER — DILTIAZEM HCL 30 MG PO TABS
30.0000 mg | ORAL_TABLET | Freq: Four times a day (QID) | ORAL | Status: DC
  Administered 2017-03-31 – 2017-04-01 (×4): 30 mg via ORAL
  Filled 2017-03-31 (×5): qty 1

## 2017-03-31 MED ORDER — ASPIRIN 81 MG PO CHEW
81.0000 mg | CHEWABLE_TABLET | ORAL | Status: AC
  Administered 2017-04-01: 81 mg via ORAL
  Filled 2017-03-31: qty 1

## 2017-03-31 MED ORDER — SODIUM CHLORIDE 0.9% FLUSH: 3.0000 mL | INTRAVENOUS | Status: DC | PRN

## 2017-03-31 MED ORDER — HEPARIN BOLUS VIA INFUSION
1000.0000 [IU] | Freq: Once | INTRAVENOUS | Status: AC
  Administered 2017-03-31: 1000 [IU] via INTRAVENOUS
  Filled 2017-03-31: qty 1000

## 2017-03-31 MED ORDER — APIXABAN 5 MG PO TABS
5.0000 mg | ORAL_TABLET | Freq: Two times a day (BID) | ORAL | Status: DC
  Administered 2017-03-31: 5 mg via ORAL
  Filled 2017-03-31: qty 1

## 2017-03-31 MED ORDER — SODIUM CHLORIDE 0.9 % IV SOLN: 250.0000 mL | INTRAVENOUS | Status: DC | PRN

## 2017-03-31 MED ORDER — SODIUM CHLORIDE 0.9% FLUSH: 3.0000 mL | Freq: Two times a day (BID) | INTRAVENOUS | Status: DC

## 2017-03-31 MED ORDER — DILTIAZEM HCL 30 MG PO TABS: 30.0000 mg | ORAL_TABLET | Freq: Four times a day (QID) | ORAL | Status: DC

## 2017-03-31 NOTE — Progress Notes (Addendum)
Progress Note  Patient Name: Sharon Figueroa Date of Encounter: 03/31/2017  Primary Cardiologist: Olga Millers, MD   Subjective   Dyspnea improving; no chest pain  Inpatient Medications    Scheduled Meds: . furosemide  20 mg Intravenous BID   Continuous Infusions: . diltiazem (CARDIZEM) infusion Stopped (03/31/17 0206)  . heparin 1,250 Units/hr (03/31/17 0910)   PRN Meds: acetaminophen, ondansetron (ZOFRAN) IV   Vital Signs    Vitals:   03/30/17 2131 03/31/17 0018 03/31/17 0440 03/31/17 0737  BP: (!) 97/56 (!) 94/57 105/64 122/86  Pulse:    81  Resp: 18 17 17 18   Temp: 99.2 F (37.3 C) 98.9 F (37.2 C) 98.5 F (36.9 C) 97.6 F (36.4 C)  TempSrc: Oral Oral Oral Oral  SpO2: 90% 95% 95% 96%  Weight:   130 lb 1.6 oz (59 kg)   Height:        Intake/Output Summary (Last 24 hours) at 03/31/2017 0947 Last data filed at 03/31/2017 0910 Gross per 24 hour  Intake 884.89 ml  Output 500 ml  Net 384.89 ml   Filed Weights   03/30/17 1200 03/30/17 1735 03/31/17 0440  Weight: 134 lb (60.8 kg) 132 lb 8 oz (60.1 kg) 130 lb 1.6 oz (59 kg)    Telemetry    Atrial fibrillation with controlled rate; Personally Reviewed   Physical Exam   GEN: No acute distress.   Neck: No JVD Cardiac: irregular Respiratory: Clear to auscultation bilaterally. GI: Soft, nontender, non-distended  MS: No edema Neuro:  Nonfocal    Labs    Chemistry Recent Labs  Lab 03/30/17 1207  NA 136  K 4.2  CL 105  CO2 18*  GLUCOSE 138*  BUN 20  CREATININE 0.98  CALCIUM 9.0  GFRNONAA >60  GFRAA >60  ANIONGAP 13     Hematology Recent Labs  Lab 03/30/17 1207 03/31/17 0652  WBC 8.5 4.7  RBC 4.01 3.54*  HGB 12.2 10.8*  HCT 37.2 33.2*  MCV 92.8 93.8  MCH 30.4 30.5  MCHC 32.8 32.5  RDW 13.3 13.7  PLT 235 190     Recent Labs  Lab 03/30/17 1216  TROPIPOC 0.01      Radiology    Dg Chest 2 View  Result Date: 03/30/2017 CLINICAL DATA:  Shortness of breath.  Chest  tightness. EXAM: CHEST  2 VIEW COMPARISON:  None. FINDINGS: Cardiomegaly. The hila and mediastinum are normal. Increased interstitial markings in the lungs, most prominent in the bases. Small effusions based on the lateral view. No other acute abnormalities. IMPRESSION: Pulmonary edema.  Small effusions. Electronically Signed   By: Gerome Sam III M.D   On: 03/30/2017 12:34    Patient Profile     57 y.o. female with past medical history of mitral regurgitation admitted with new onset atrial fibrillation and congestive heart failure.  Assessment & Plan    1. New-onset atrial fibrillation-patient remains in atrial fibrillation today.  Heart rate slightly decreased last evening and IV Cardizem discontinued.  Will resume Cardizem 30 mg every 6 hours and transition to CD once dose optimized.  TSH normal.  Await echocardiogram.  Discontinue IV heparin and treat with apixaban 5 mg twice daily.  Possible discharge tomorrow morning pending results of echocardiogram and symptoms.  Would likely proceed with cardioversion in 4 weeks if atrial fibrillation persists.   2. History of mitral regurgitation-patient previously states she had serial echocardiograms for mitral regurgitation but has not had one in 10 years.  I am  concerned mitral regurgitation precipitated atrial fibrillation.  Await results of echocardiogram.  May need transesophageal echocardiogram to further assess mitral regurgitation pending transthoracic results.   3. Acute diastolic congestive heart failure-improving.  Continue Lasix 20 mg IV twice daily.  Follow renal function closely.  CHF likely secondary to new onset atrial fibrillation. 4. Tobacco abuse-patient counseled on discontinuing.    I have reviewed the patient's echocardiogram.  She has mild global reduction in LV systolic function.  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  There is severe left atrial enlargement.  There is mild  aortic stenosis and moderate aortic insufficiency.  She has severe pulmonary hypertension.  I think she would likely require aortic valve and mitral valve replacements.  She could also have a maze at the time of her valve surgery.  I have discussed this in depth with her.  We will hold her apixaban.  Plan to proceed with right and left cardiac catheterization tomorrow.  The risks and benefits including myocardial infarction, CVA and death discussed and she agrees to proceed.    For questions or updates, please contact [facility] Please consult [email or web address] for contact info under Cardiology/STEMI.      Signed, Olga MillersBrian Avila Albritton, MD  03/31/2017, 9:47 AM

## 2017-03-31 NOTE — Progress Notes (Signed)
ANTICOAGULATION CONSULT NOTE - Initial Consult  Pharmacy Consult for Heparin Indication: atrial fibrillation  No Known Allergies  Patient Measurements: Height: 5\' 6"  (167.6 cm) Weight: 130 lb 1.6 oz (59 kg) IBW/kg (Calculated) : 59.3 Heparin Dosing Weight: 60 kg  Vital Signs: Temp: 97.6 F (36.4 C) (01/27 0737) Temp Source: Oral (01/27 0737) BP: 122/86 (01/27 0737) Pulse Rate: 81 (01/27 0737)  Labs: Recent Labs    03/30/17 1207 03/30/17 2225 03/31/17 0652  HGB 12.2  --  10.8*  HCT 37.2  --  33.2*  PLT 235  --  190  HEPARINUNFRC  --  0.15* 0.20*  CREATININE 0.98  --   --     Estimated Creatinine Clearance: 58.3 mL/min (by C-G formula based on SCr of 0.98 mg/dL).   Medical History: Past Medical History:  Diagnosis Date  . Mitral valve disorder    "leaky" valve diagnosed 30+ yr ago    Assessment: [age] YOF who presented on 1/26 with increased SOB with exertion and was found to have new Afib. Cardiology consulted and considered TEE-DCCV in 4 weeks after anticoagulation. Pharmacy consulted to start Heparin for anticoagulation with plans like to eventually transition to Apixaban.   HL 0.15 on 850 units/hr / 3000 units bolus (subtherapeutic) HL 0.20 on 1050 units/hr (subtherapeutic)  Goal of Therapy:  Heparin level 0.3-0.7 units/ml Monitor platelets by anticoagulation protocol: Yes   Plan:  1,000 units heparin bolus Increase heparin to 1250 units/hr Daily HL, CBC Check level in 6 hours   Adeana Grilliot A Devaunte Gasparini  03/31/2017 8:06 AM

## 2017-03-31 NOTE — Progress Notes (Signed)
  Echocardiogram 2D Echocardiogram has been performed.  Sharon HarderWest, Sharon Figueroa R 03/31/2017, 12:13 PM

## 2017-03-31 NOTE — Progress Notes (Signed)
Pt heart rate maintaining 50s atrial fib while sleeping.   Diltiazem infusing at 5 mg /hr and stopped per orders.  Will continue to monitor closely.

## 2017-04-01 ENCOUNTER — Other Ambulatory Visit: Payer: Self-pay | Admitting: *Deleted

## 2017-04-01 ENCOUNTER — Encounter (HOSPITAL_COMMUNITY): Payer: Self-pay | Admitting: Cardiovascular Disease

## 2017-04-01 ENCOUNTER — Encounter (HOSPITAL_COMMUNITY)
Admission: EM | Disposition: A | Discharge status: Home / Self Care | Payer: Self-pay | Source: Home / Self Care | Attending: Surgery

## 2017-04-01 DIAGNOSIS — I351 Nonrheumatic aortic (valve) insufficiency: Secondary | ICD-10-CM

## 2017-04-01 DIAGNOSIS — I482 Chronic atrial fibrillation: Secondary | ICD-10-CM

## 2017-04-01 DIAGNOSIS — I099 Rheumatic heart disease, unspecified: Secondary | ICD-10-CM

## 2017-04-01 DIAGNOSIS — I340 Nonrheumatic mitral (valve) insufficiency: Secondary | ICD-10-CM

## 2017-04-01 DIAGNOSIS — I051 Rheumatic mitral insufficiency: Secondary | ICD-10-CM

## 2017-04-01 DIAGNOSIS — I050 Rheumatic mitral stenosis: Secondary | ICD-10-CM

## 2017-04-01 HISTORY — PX: RIGHT/LEFT HEART CATH AND CORONARY ANGIOGRAPHY: CATH118266

## 2017-04-01 LAB — POCT I-STAT 3, VENOUS BLOOD GAS (G3P V)
Bicarbonate: 23.9 mmol/L (ref 20.0–28.0)
O2 Saturation: 59 %
TCO2: 25 mmol/L (ref 22–32)
pCO2, Ven: 37.3 mmHg — ABNORMAL LOW (ref 44.0–60.0)
pH, Ven: 7.416 (ref 7.250–7.430)
pO2, Ven: 30 mmHg — CL (ref 32.0–45.0)

## 2017-04-01 LAB — CBC
HCT: 38.5 % (ref 36.0–46.0)
Hemoglobin: 12.7 g/dL (ref 12.0–15.0)
MCH: 31.4 pg (ref 26.0–34.0)
MCHC: 33 g/dL (ref 30.0–36.0)
MCV: 95.1 fL (ref 78.0–100.0)
Platelets: 246 10*3/uL (ref 150–400)
RBC: 4.05 MIL/uL (ref 3.87–5.11)
RDW: 13.5 % (ref 11.5–15.5)
WBC: 6.3 10*3/uL (ref 4.0–10.5)

## 2017-04-01 LAB — POCT I-STAT 3, ART BLOOD GAS (G3+)
Acid-base deficit: 2 mmol/L (ref 0.0–2.0)
Bicarbonate: 21.7 mmol/L (ref 20.0–28.0)
O2 Saturation: 96 %
TCO2: 23 mmol/L (ref 22–32)
pCO2 arterial: 32.3 mmHg (ref 32.0–48.0)
pH, Arterial: 7.435 (ref 7.350–7.450)
pO2, Arterial: 78 mmHg — ABNORMAL LOW (ref 83.0–108.0)

## 2017-04-01 LAB — BASIC METABOLIC PANEL
Anion gap: 10 (ref 5–15)
BUN: 12 mg/dL (ref 6–20)
CO2: 26 mmol/L (ref 22–32)
Calcium: 9.1 mg/dL (ref 8.9–10.3)
Chloride: 102 mmol/L (ref 101–111)
Creatinine, Ser: 0.92 mg/dL (ref 0.44–1.00)
GFR calc Af Amer: >60 mL/min (ref >60)
GFR calc non Af Amer: >60 mL/min (ref >60)
Glucose, Bld: 92 mg/dL (ref 65–99)
Potassium: 3.3 mmol/L — ABNORMAL LOW (ref 3.5–5.1)
Sodium: 138 mmol/L (ref 135–145)

## 2017-04-01 LAB — PROTIME-INR
INR: 1
Prothrombin Time: 13.1 seconds (ref 11.4–15.2)

## 2017-04-01 SURGERY — RIGHT/LEFT HEART CATH AND CORONARY ANGIOGRAPHY
Anesthesia: LOCAL

## 2017-04-01 MED ORDER — DILTIAZEM HCL 60 MG PO TABS
60.0000 mg | ORAL_TABLET | Freq: Three times a day (TID) | ORAL | Status: DC
  Administered 2017-04-01 – 2017-04-04 (×10): 60 mg via ORAL
  Filled 2017-04-01 (×10): qty 1

## 2017-04-01 MED ORDER — HEPARIN (PORCINE) IN NACL 2-0.9 UNIT/ML-% IJ SOLN
INTRAMUSCULAR | Status: AC | PRN
  Administered 2017-04-01: 1000 mL

## 2017-04-01 MED ORDER — SODIUM CHLORIDE 0.9 % IV SOLN: 250.0000 mL | INTRAVENOUS | Status: DC | PRN

## 2017-04-01 MED ORDER — SODIUM CHLORIDE 0.9 % IV SOLN: INTRAVENOUS | Status: AC

## 2017-04-01 MED ORDER — IOPAMIDOL (ISOVUE-370) INJECTION 76%
INTRAVENOUS | Status: DC | PRN
  Administered 2017-04-01: 35 mL via INTRA_ARTERIAL

## 2017-04-01 MED ORDER — ONDANSETRON HCL 4 MG/2ML IJ SOLN: 4.0000 mg | Freq: Four times a day (QID) | INTRAMUSCULAR | Status: DC | PRN

## 2017-04-01 MED ORDER — ACETAMINOPHEN 325 MG PO TABS: 650.0000 mg | ORAL_TABLET | ORAL | Status: DC | PRN

## 2017-04-01 MED ORDER — HEPARIN (PORCINE) IN NACL 2-0.9 UNIT/ML-% IJ SOLN
INTRAMUSCULAR | Status: AC
  Filled 2017-04-01: qty 1000

## 2017-04-01 MED ORDER — POTASSIUM CHLORIDE CRYS ER 20 MEQ PO TBCR
40.0000 meq | EXTENDED_RELEASE_TABLET | Freq: Once | ORAL | Status: AC
  Administered 2017-04-01: 40 meq via ORAL
  Filled 2017-04-01: qty 2

## 2017-04-01 MED ORDER — IOPAMIDOL (ISOVUE-370) INJECTION 76%
INTRAVENOUS | Status: AC
  Filled 2017-04-01: qty 100

## 2017-04-01 MED ORDER — POTASSIUM CHLORIDE CRYS ER 20 MEQ PO TBCR: 40.0000 meq | EXTENDED_RELEASE_TABLET | Freq: Once | ORAL | Status: DC

## 2017-04-01 MED ORDER — FUROSEMIDE 20 MG PO TABS
20.0000 mg | ORAL_TABLET | Freq: Every day | ORAL | Status: DC
  Administered 2017-04-01 – 2017-04-03 (×3): 20 mg via ORAL
  Filled 2017-04-01 (×3): qty 1

## 2017-04-01 MED ORDER — LIDOCAINE HCL (PF) 1 % IJ SOLN
INTRAMUSCULAR | Status: DC | PRN
  Administered 2017-04-01: 20 mL via INTRADERMAL

## 2017-04-01 MED ORDER — LIDOCAINE HCL (PF) 1 % IJ SOLN
INTRAMUSCULAR | Status: AC
  Filled 2017-04-01: qty 30

## 2017-04-01 MED ORDER — POTASSIUM CHLORIDE CRYS ER 20 MEQ PO TBCR
20.0000 meq | EXTENDED_RELEASE_TABLET | Freq: Every day | ORAL | Status: DC
  Administered 2017-04-02 – 2017-04-03 (×2): 20 meq via ORAL
  Filled 2017-04-01 (×2): qty 1

## 2017-04-01 MED ORDER — ASPIRIN 81 MG PO CHEW
81.0000 mg | CHEWABLE_TABLET | Freq: Every day | ORAL | Status: DC
  Administered 2017-04-02 – 2017-04-03 (×2): 81 mg via ORAL
  Filled 2017-04-01 (×2): qty 1

## 2017-04-01 MED ORDER — SODIUM CHLORIDE 0.9% FLUSH: 3.0000 mL | INTRAVENOUS | Status: DC | PRN

## 2017-04-01 MED ORDER — MORPHINE SULFATE (PF) 2 MG/ML IV SOLN: 2.0000 mg | INTRAVENOUS | Status: DC | PRN

## 2017-04-01 MED ORDER — SODIUM CHLORIDE 0.9% FLUSH
3.0000 mL | Freq: Two times a day (BID) | INTRAVENOUS | Status: DC
  Administered 2017-04-01 – 2017-04-03 (×5): 3 mL via INTRAVENOUS

## 2017-04-01 SURGICAL SUPPLY — 12 items
CATH INFINITI 5FR MULTPACK ANG (CATHETERS) ×2 IMPLANT
CATH SWAN GANZ 7F STRAIGHT (CATHETERS) ×2 IMPLANT
COVER DOME SNAP 22 D (MISCELLANEOUS) ×2 IMPLANT
DEVICE CLOSURE MYNXGRIP 5F (Vascular Products) ×2 IMPLANT
KIT HEART LEFT (KITS) ×2 IMPLANT
KIT HEART RIGHT NAMIC (KITS) ×4 IMPLANT
PACK CARDIAC CATHETERIZATION (CUSTOM PROCEDURE TRAY) ×2 IMPLANT
SHEATH PINNACLE 5F 10CM (SHEATH) ×2 IMPLANT
SHEATH PINNACLE 7F 10CM (SHEATH) ×2 IMPLANT
TRANSDUCER W/STOPCOCK (MISCELLANEOUS) ×4 IMPLANT
TUBING CIL FLEX 10 FLL-RA (TUBING) ×2 IMPLANT
WIRE EMERALD 3MM-J .035X150CM (WIRE) ×2 IMPLANT

## 2017-04-01 NOTE — Care Management Note (Addendum)
Case Management Note  Patient Details  Name: Sharon Figueroa MRN: [id number] Date of Birth: 06-08-1960  Subjective/Objective:  Pt presented for Atrial Fib- Plan for home on Eliquis- Benefits Check in process.                 Action/Plan: CM will make pt aware of cost once completed. No further needs from CM at this time.   Expected Discharge Date:                Expected Discharge Plan:  Home/Self Care  In-House Referral:  NA  Discharge planning Services  CM Consult  Post Acute Care Choice:  NA Choice offered to:  NA  DME Arranged:    DME Agency:  NA  HH Arranged:  NA HH Agency:  NA  Status of Service:  Completed, signed off  If discussed at [place] of Stay Meetings, dates discussed:    Additional Comments: 0944 04-02-17 Jacqlyn Krauss, RN,BSN [phone] Post Cath-04-01-17. Plan for MVR/AVR/Maze 04-04-17. Plan IV Heparin gtt. Will not need Eliquis post procedure- plan most likely coumadin at d/c. No further needs from CM at this time.    1451 04-01-17 Jacqlyn Krauss, RN, BSN [phone] S/W [name] @ [place] # [phone]   ELIQUIS 5 MG BID  COVER- YES  CO-PAY- $ 70.00  PRIOR APPROVAL- YES # [phone]   DEDUCTIBLE: NOT MET / $ 2,500.00   PREFERRED PHARMACY : [facility]  [name], RN 04/01/2017, 10:58 AM

## 2017-04-01 NOTE — Progress Notes (Signed)
I called [facility] to see.  Corine ShelterLUKE Malloree Raboin PA-C 04/01/2017 3:49 PM

## 2017-04-01 NOTE — H&P (View-Only) (Signed)
Progress Note  Patient Name: Sharon MinerLinda Crevier Date of Encounter: 04/01/2017  Primary Cardiologist: Olga MillersBrian Taegan Haider, MD   Subjective   No dyspnea or chest pain  Inpatient Medications    Scheduled Meds: . diltiazem  30 mg Oral Q6H  . furosemide  20 mg Intravenous BID  . sodium chloride flush  3 mL Intravenous Q12H   Continuous Infusions: . sodium chloride    . sodium chloride     PRN Meds: sodium chloride, acetaminophen, ondansetron (ZOFRAN) IV, sodium chloride flush   Vital Signs    Vitals:   03/31/17 1931 04/01/17 0007 04/01/17 0452 04/01/17 0656  BP: 94/68 102/60 101/61 106/66  Pulse: 91  80   Resp: 16  16   Temp: 97.8 F (36.6 C)  98 F (36.7 C)   TempSrc: Oral  Oral   SpO2: 94%  96%   Weight:   126 lb 3.2 oz (57.2 kg)   Height:        Intake/Output Summary (Last 24 hours) at 04/01/2017 0750 Last data filed at 03/31/2017 1840 Gross per 24 hour  Intake 383.08 ml  Output 2000 ml  Net -1616.92 ml   Filed Weights   03/30/17 1735 03/31/17 0440 04/01/17 0452  Weight: 132 lb 8 oz (60.1 kg) 130 lb 1.6 oz (59 kg) 126 lb 3.2 oz (57.2 kg)    Telemetry    Atrial fibrillation with intermittent tachycardia; Personally Reviewed   Physical Exam   GEN: WD, WN No acute distress.   Neck: No JVD, supple Cardiac: irregular, normal rate Respiratory: Clear to auscultation bilaterally; no wheeze. GI: Soft, nontender, non-distended, no masses  MS: no edema Neuro:  grossly intact   Labs    Chemistry Recent Labs  Lab 03/30/17 1207  NA 136  K 4.2  CL 105  CO2 18*  GLUCOSE 138*  BUN 20  CREATININE 0.98  CALCIUM 9.0  GFRNONAA >60  GFRAA >60  ANIONGAP 13     Hematology Recent Labs  Lab 03/30/17 1207 03/31/17 0652  WBC 8.5 4.7  RBC 4.01 3.54*  HGB 12.2 10.8*  HCT 37.2 33.2*  MCV 92.8 93.8  MCH 30.4 30.5  MCHC 32.8 32.5  RDW 13.3 13.7  PLT 235 190     Recent Labs  Lab 03/30/17 1216  TROPIPOC 0.01      Radiology    Dg Chest 2  View  Result Date: 03/30/2017 CLINICAL DATA:  Shortness of breath.  Chest tightness. EXAM: CHEST  2 VIEW COMPARISON:  None. FINDINGS: Cardiomegaly. The hila and mediastinum are normal. Increased interstitial markings in the lungs, most prominent in the bases. Small effusions based on the lateral view. No other acute abnormalities. IMPRESSION: Pulmonary edema.  Small effusions. Electronically Signed   By: Gerome Samavid  Williams III M.D   On: 03/30/2017 12:34    Patient Profile     57 y.o. female with past medical history of mitral regurgitation admitted with new onset atrial fibrillation and congestive heart failure.  Assessment & Plan    1. New-onset atrial fibrillation-patient remains in atrial fibrillation today; rate elevated at times; change cardizem to 60 mg q 8.  TSH normal.  Echo shows rheumatic MV with severe MS/mild MR and mild AS/moderate MR. Plan R and L cath today; will likely need MVR/AVR/Maze; reinitiate heparin following cath. 2. 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  There is also mild aortic stenosis and moderate aortic insufficiency.  I will review her  echo with colleagues.  Best option is likely mitral valve and aortic valve replacement.  Balloon mitral valvuloplasty could be considered but would not address aortic valve.  Her mitral valve is also severely thickened.  Note she does have pulmonary hypertension.   3. Acute diastolic congestive heart failure-improved.  Likely secondary to atrial fibrillation superimposed on mitral stenosis.  Change Lasix to oral. 4. Tobacco abuse-patient counseled on discontinuing.  For questions or updates, please contact [facility] Please consult [email or web address] for contact info under Cardiology/STEMI.      Signed, Olga Millers, MD  04/01/2017, 7:50 AM

## 2017-04-01 NOTE — Consult Note (Signed)
[address]       [place] [address]             [phone]      Cardiothoracic Surgery Consultation  Reason for Consult: Severe rheumatic mitral stenosis, mild aortic stenosis and moderate AI. Referring Physician: Dr. Kirk Ruths  Sharon Figueroa is an 57 y.o. female.  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  She says that she has not had an echo for over 10 years and now presented with a 4-day history of worsening shortness of breath with exertion, orthopnea, PND, and chest heaviness with lying down in bed.  She does note development of progressive fatigue over the past several months although she has been able to remain fairly active with her daily routine.  Her initial troponin was 0.01.  Chest x-ray showed increased interstitial markings in the lungs consistent with pulmonary edema and small pleural effusions.  Electrocardiogram showed atrial flutter with a ventricular rate of 140. She improved with diuresis.  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  The valve area by continuity equation was 0.55 cm.  There was mild mitral regurgitation.  The aortic valve had moderate calcification with mild stenosis and moderate regurgitation.  Left ventricular systolic function was mildly reduced with ejection fraction 45-50% with diffuse hypokinesis.  Left atrium severely dilated with severe pulmonary hypertension.  She underwent cardiac catheterization today showing no coronary disease.  There is moderate pulmonary hypertension with PA pressure of 56/28.  Right atrial pressure was 6.  Wedge pressure showed a mean of 29.  Past Medical History:  Diagnosis Date  .  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 No prior surgery    . RIGHT/LEFT HEART CATH AND CORONARY ANGIOGRAPHY N/A 04/01/2017   Procedure: RIGHT/LEFT HEART CATH AND CORONARY ANGIOGRAPHY;  Surgeon: Lorretta Harp, MD;  Location: [facility];  Service: Cardiovascular;  Laterality: N/A;    Family History  Problem Relation Age of Onset  . CVA Father   . Heart attack Father     Social History:  reports that she has been smoking cigarettes.  She has been smoking about 1.00 pack per day. she has never used smokeless tobacco. She reports that she does not drink alcohol or use drugs.  Allergies: No Known Allergies  Medications:  I have reviewed the patient's current medications. Prior to Admission:  No medications prior to admission.   Scheduled: . [START ON 04/02/2017] aspirin  81 mg Oral Daily  . diltiazem  60 mg Oral Q8H  . furosemide  20 mg Oral Daily  . [START ON 04/02/2017] potassium chloride  20 mEq Oral Daily  . sodium chloride flush  3 mL Intravenous Q12H   Continuous: . sodium chloride     WVP:XTGGYI chloride, acetaminophen, morphine injection, ondansetron (ZOFRAN) IV, sodium chloride flush  Results for orders placed or performed during the hospital encounter of 03/30/17 (from the past 48 hour(s))  Heparin level (unfractionated)     Status: Abnormal   Collection Time: 03/30/17 10:25 PM  Result Value Ref  Range   Heparin Unfractionated 0.15 (L) 0.30 - 0.70 IU/mL    Comment:        IF HEPARIN RESULTS ARE BELOW EXPECTED VALUES, AND PATIENT DOSAGE HAS BEEN CONFIRMED, SUGGEST FOLLOW UP TESTING OF ANTITHROMBIN III LEVELS.   Heparin level (unfractionated)     Status: Abnormal   Collection Time: 03/31/17  6:52 AM  Result Value Ref Range   Heparin Unfractionated 0.20 (L) 0.30 - 0.70 IU/mL    Comment:        IF HEPARIN RESULTS ARE BELOW EXPECTED VALUES, AND PATIENT DOSAGE HAS BEEN  CONFIRMED, SUGGEST FOLLOW UP TESTING OF ANTITHROMBIN III LEVELS.   CBC     Status: Abnormal   Collection Time: 03/31/17  6:52 AM  Result Value Ref Range   WBC 4.7 4.0 - 10.5 K/uL   RBC 3.54 (L) 3.87 - 5.11 MIL/uL   Hemoglobin 10.8 (L) 12.0 - 15.0 g/dL   HCT 33.2 (L) 36.0 - 46.0 %   MCV 93.8 78.0 - 100.0 fL   MCH 30.5 26.0 - 34.0 pg   MCHC 32.5 30.0 - 36.0 g/dL   RDW 13.7 11.5 - 15.5 %   Platelets 190 150 - 400 K/uL  CBC     Status: None   Collection Time: 04/01/17  6:56 AM  Result Value Ref Range   WBC 6.3 4.0 - 10.5 K/uL   RBC 4.05 3.87 - 5.11 MIL/uL   Hemoglobin 12.7 12.0 - 15.0 g/dL   HCT 38.5 36.0 - 46.0 %   MCV 95.1 78.0 - 100.0 fL   MCH 31.4 26.0 - 34.0 pg   MCHC 33.0 30.0 - 36.0 g/dL   RDW 13.5 11.5 - 15.5 %   Platelets 246 150 - 400 K/uL  Basic metabolic panel     Status: Abnormal   Collection Time: 04/01/17  6:56 AM  Result Value Ref Range   Sodium 138 135 - 145 mmol/L   Potassium 3.3 (L) 3.5 - 5.1 mmol/L   Chloride 102 101 - 111 mmol/L   CO2 26 22 - 32 mmol/L   Glucose, Bld 92 65 - 99 mg/dL   BUN 12 6 - 20 mg/dL   Creatinine, Ser 0.92 0.44 - 1.00 mg/dL   Calcium 9.1 8.9 - 10.3 mg/dL   GFR calc non Af Amer >60 >60 mL/min   GFR calc Af Amer >60 >60 mL/min    Comment: (NOTE) The eGFR has been calculated using the CKD EPI equation. This calculation has not been validated in all clinical situations. eGFR's persistently <60 mL/min signify possible Chronic Kidney Disease.    Anion gap 10 5 - 15  Protime-INR     Status: None   Collection Time: 04/01/17  6:56 AM  Result Value Ref Range   Prothrombin Time 13.1 11.4 - 15.2 seconds   INR 1.00   I-STAT 3, arterial blood gas (G3+)     Status: Abnormal   Collection Time: 04/01/17 12:44 PM  Result Value Ref Range   pH, Arterial 7.435 7.350 - 7.450   pCO2 arterial 32.3 32.0 - 48.0 mmHg   pO2, Arterial 78.0 (L) 83.0 - 108.0 mmHg   Bicarbonate 21.7 20.0 - 28.0 mmol/L   TCO2 23 22 - 32 mmol/L   O2 Saturation 96.0 %     Acid-base deficit 2.0 0.0 - 2.0 mmol/L   Patient temperature HIDE    Sample type ARTERIAL   I-STAT 3, venous blood gas (G3P V)     Status: Abnormal  Collection Time: 04/01/17 12:44 PM  Result Value Ref Range   pH, Ven 7.416 7.250 - 7.430   pCO2, Ven 37.3 (L) 44.0 - 60.0 mmHg   pO2, Ven 30.0 (LL) 32.0 - 45.0 mmHg   Bicarbonate 23.9 20.0 - 28.0 mmol/L   TCO2 25 22 - 32 mmol/L   O2 Saturation 59.0 %   Patient temperature HIDE    Sample type VENOUS    Comment NOTIFIED PHYSICIAN     No results found.  Review of Systems  Constitutional: Positive for malaise/fatigue. Negative for chills and fever.  HENT: Negative.        Sees her dentist regularly  Eyes: Negative.   Respiratory: Positive for shortness of breath.   Cardiovascular: Positive for chest pain, orthopnea and PND. Negative for palpitations and leg swelling.  Gastrointestinal: Negative.   Genitourinary: Negative.   Musculoskeletal: Negative.   Skin: Negative.   Neurological: Negative.   Endo/Heme/Allergies: Negative.   Psychiatric/Behavioral: Negative.    Blood pressure 111/75, pulse 89, temperature 98.1 F (36.7 C), temperature source Oral, resp. rate 18, height _0  (1.676 m), weight 57.2 kg (126 lb 3.2 oz), SpO2 95 %. Physical Exam  Constitutional: She is oriented to person, place, and time. She appears well-developed and well-nourished. No distress.  HENT:  Head: Normocephalic and atraumatic.  Mouth/Throat: Oropharynx is clear and moist.  Eyes: Conjunctivae and EOM are normal. Pupils are equal, round, and reactive to light.  Neck: Normal range of motion. Neck supple. No JVD present. No thyromegaly present.  Cardiovascular: Normal rate and intact distal pulses.  No murmur heard. Irregular rate and rhythm.  Respiratory: Effort normal and breath sounds normal. No respiratory distress. She has no wheezes. She has no rales.  GI: Soft. Bowel sounds are normal. She exhibits no distension and no mass. There is no  tenderness.  Musculoskeletal: Normal range of motion. She exhibits no edema.  Lymphadenopathy:    She has no cervical adenopathy.  Neurological: She is alert and oriented to person, place, and time. She has normal strength. No cranial nerve deficit or sensory deficit.  Skin: Skin is warm and dry.  Psychiatric: She has a normal mood and affect.            *[facility]*                         [place], [place] [address]                            [phone]  ------------------------------------------------------------------- Transthoracic Echocardiography  Patient:    Sharon Figueroa, Sharon Figueroa MR #:       [id number] Study Date: 03/31/2017 Gender:     F Age:        75 Height:     167.6 cm Weight:     59 kg BSA:        1.66 m^2 Pt. Status: Room:       [place], [place]  PERFORMING   [facility], Inpatient  SONOGRAPHER  [facility]     Roby Lofts M.  REFERRING    Kroeger, Daleen Snook M.  cc:  ------------------------------------------------------------------- LV EF: 45% -   50%  ------------------------------------------------------------------- Indications:      Dyspnea 786.09.  ------------------------------------------------------------------- History:   PMH:   Atrial fibrillation.  Mitral valve disease.  ------------------------------------------------------------------- Study Conclusions  - Left ventricle: The cavity size was normal. Systolic function was   mildly reduced. The estimated ejection fraction was in the range   of 45% to 50%. Diffuse hypokinesis. - Aortic valve: There was mild stenosis. There was moderate   regurgitation. Valve area (VTI): 1.68 cm^2. Valve area (Vmax):   1.38 cm^2. Valve area (Vmean): 1.51 cm^2. - Mitral valve: Severe thickening, consistent with rheumatic   disease. The findings are consistent  with severe stenosis. There   was mild regurgitation. Valve area by pressure half-time: 0.81   cm^2. Valve area by continuity equation (using LVOT flow): 0.55   cm^2. - Left atrium: The atrium was severely dilated. - Pulmonary arteries: Systolic pressure was severely increased. PA   peak pressure: 72 mm Hg (S). - Pericardium, extracardiac: A trivial pericardial effusion was   identified.  Impressions:  - Mild global reduction in LV systolic function; calcified aortic   valve with mild AS and moderate AI; thickened, rheumatic MV with   severe Sharon and mild MR; severe LAE; mild TR with severely elevated   pulmonary pressure.  ------------------------------------------------------------------- Study data:  No prior study was available for comparison.  Study status:  Routine.  Procedure:  The patient reported no pain pre or post test. Transthoracic echocardiography. Image quality was adequate.  Study completion:  There were no complications. Transthoracic echocardiography.  M-mode, complete 2D, spectral Doppler, and color Doppler.  Birthdate:  Patient birthdate: [birth year].  Age:  Patient is 57 yr old.  Sex:  Gender: female. BMI: 21 kg/m^2.  Blood pressure:     122/86  Patient status: Inpatient.  Study date:  Study date: 03/31/2017. Study time: 11:25 AM.  Location:  Bedside.  -------------------------------------------------------------------  ------------------------------------------------------------------- Left ventricle:  The cavity size was normal. Systolic function was mildly reduced. The estimated ejection fraction was in the range of 45% to 50%. Diffuse hypokinesis. The study was not technically sufficient to allow evaluation of LV diastolic dysfunction due to atrial fibrillation.  ------------------------------------------------------------------- Aortic valve:   Trileaflet; moderately calcified leaflets. Doppler:   There was mild stenosis.   There was  moderate regurgitation.    VTI ratio of LVOT to aortic valve: 0.49. Valve area (VTI): 1.68 cm^2. Indexed valve area (VTI): 1.01 cm^2/m^2. Peak velocity ratio of LVOT to aortic valve: 0.4. Valve area (Vmax): 1.38 cm^2. Indexed valve area (Vmax): 0.83 cm^2/m^2. Mean velocity ratio of LVOT to aortic valve: 0.44. Valve area (Vmean): 1.51 cm^2. Indexed valve area (Vmean): 0.91 cm^2/m^2.    Mean gradient (S): 10 mm Hg. Peak gradient (S): 22 mm Hg.  ------------------------------------------------------------------- Aorta:  Aortic root: The aortic root was normal in size.  ------------------------------------------------------------------- Mitral valve:   Severe thickening, consistent with rheumatic disease. Mobility was not restricted.  Doppler:   The findings are consistent with severe stenosis.   There was mild regurgitation. Valve area by pressure half-time: 0.81 cm^2. Indexed valve area by pressure half-time: 0.49 cm^2/m^2. Valve area by continuity equation (using LVOT flow): 0.55 cm^2. Indexed valve area by continuity equation (using LVOT flow): 0.33 cm^2/m^2.    Mean gradient (D): 23 mm Hg. Peak gradient (D): 31 mm Hg.  ------------------------------------------------------------------- Left atrium:  The atrium was severely dilated.  ------------------------------------------------------------------- Right ventricle:  The cavity  size was normal. Systolic function was normal.  ------------------------------------------------------------------- Pulmonic valve:    Doppler:  Transvalvular velocity was within the normal range. There was no evidence for stenosis.  ------------------------------------------------------------------- Tricuspid valve:   Structurally normal valve.    Doppler: Transvalvular velocity was within the normal range. There was mild regurgitation.  ------------------------------------------------------------------- Pulmonary artery:   Systolic pressure was  severely increased.  ------------------------------------------------------------------- Right atrium:  The atrium was normal in size.  ------------------------------------------------------------------- Pericardium:  A trivial pericardial effusion was identified.  ------------------------------------------------------------------- Systemic veins: Inferior vena cava: The vessel was dilated.  ------------------------------------------------------------------- Measurements   Left ventricle                           Value          Reference  LV ID, ED, PLAX chordal          (L)     36.8  mm       43 - 52  LV ID, ES, PLAX chordal                  29.4  mm       23 - 38  LV fx shortening, PLAX chordal   (L)     20    %        >=29  LV PW thickness, ED                      9.96  mm       ----------  IVS/LV PW ratio, ED                      1.03           <=1.3  Stroke volume, 2D                        60    ml       ----------  Stroke volume/bsa, 2D                    36    ml/m^2   ----------  LV ejection fraction, 1-p A4C            31    %        ----------  LV end-diastolic volume, 2-p             95    ml       ----------  LV end-systolic volume, 2-p              64    ml       ----------  LV ejection fraction, 2-p                33    %        ----------  Stroke volume, 2-p                       31    ml       ----------  LV end-diastolic volume/bsa, 2-p         57    ml/m^2   ----------  LV end-systolic volume/bsa, 2-p          39    ml/m^2   ----------  Stroke volume/bsa, 2-p  18.5  ml/m^2   ----------    Ventricular septum                       Value          Reference  IVS thickness, ED                        10.27 mm       ----------    LVOT                                     Value          Reference  LVOT ID, S                               21    mm       ----------  LVOT area                                3.46  cm^2     ----------  LVOT peak  velocity, S                    94.6  cm/s     ----------  LVOT mean velocity, S                    62    cm/s     ----------  LVOT VTI, S                              17.3  cm       ----------  LVOT peak gradient, S                    4     mm Hg    ----------    Aortic valve                             Value          Reference  Aortic valve peak velocity, S            237   cm/s     ----------  Aortic valve mean velocity, S            142   cm/s     ----------  Aortic valve VTI, S                      35.6  cm       ----------  Aortic mean gradient, S                  10    mm Hg    ----------  Aortic peak gradient, S                  22    mm Hg    ----------  VTI ratio, LVOT/AV                       0.49           ----------  Aortic  valve area, VTI                   1.68  cm^2     ----------  Aortic valve area/bsa, VTI               1.01  cm^2/m^2 ----------  Velocity ratio, peak, LVOT/AV            0.4            ----------  Aortic valve area, peak velocity         1.38  cm^2     ----------  Aortic valve area/bsa, peak              0.83  cm^2/m^2 ----------  velocity  Velocity ratio, mean, LVOT/AV            0.44           ----------  Aortic valve area, mean velocity         1.51  cm^2     ----------  Aortic valve area/bsa, mean              0.91  cm^2/m^2 ----------  velocity  Aortic regurg pressure half-time         329   Sharon       ----------    Aorta                                    Value          Reference  Aortic root ID, ED                       33    mm       ----------  Ascending aorta ID, A-P, S               29    mm       ----------    Left atrium                              Value          Reference  LA ID, A-P, ES                           51    mm       ----------  LA ID/bsa, A-P                   (H)     3.08  cm/m^2   <=2.2  LA volume, ES, 1-p A4C                   75.3  ml       ----------  LA volume/bsa, ES, 1-p A4C               45.5  ml/m^2   ----------  LA  volume, ES, 1-p A2C                   158   ml       ----------  LA volume/bsa, ES, 1-p A2C               95.4  ml/m^2   ----------    Mitral valve  Value          Reference  Mitral E-wave peak velocity              277   cm/s     ----------  Mitral mean velocity, D                  212   cm/s     ----------  Mitral deceleration time         (H)     810   Sharon       150 - 230  Mitral pressure half-time                237   Sharon       ----------  Mitral mean gradient, D                  23    mm Hg    ----------  Mitral peak gradient, D                  31    mm Hg    ----------  Mitral valve area, PHT, DP               0.81  cm^2     ----------  Mitral valve area/bsa, PHT, DP           0.49  cm^2/m^2 ----------  Mitral valve area, LVOT                  0.55  cm^2     ----------  continuity  Mitral valve area/bsa, LVOT              0.33  cm^2/m^2 ----------  continuity  Mitral annulus VTI, D                    109   cm       ----------  Mitral maximal regurg velocity,          436   cm/s     ----------  PISA  Mitral regurg VTI, PISA                  118   cm       ----------  Mitral ERO, PISA                         0.79  cm^2     ----------  Mitral regurg volume, PISA               93    ml       ----------    Pulmonary arteries                       Value          Reference  PA pressure, S, DP               (H)     72    mm Hg    <=30    Tricuspid valve                          Value          Reference  Tricuspid regurg peak velocity           379   cm/s     ----------  Tricuspid peak RV-RA gradient  57    mm Hg    ----------    Right atrium                             Value          Reference  RA ID, S-I, ES, A4C              (H)     57    mm       34 - 49  RA area, ES, A4C                         14    cm^2     8.3 - 19.5  RA volume, ES, A/L                       28    ml       ----------  RA volume/bsa, ES, A/L                   16.9  ml/m^2    ----------    Systemic veins                           Value          Reference  Estimated CVP                            15    mm Hg    ----------    Right ventricle                          Value          Reference  TAPSE                                    11.1  mm       ----------  RV pressure, S, DP               (H)     72    mm Hg    <=30  Legend: (L)  and  (H)  mark values outside specified reference range.  ------------------------------------------------------------------- Prepared and Electronically Authenticated by  Kirk Ruths 2019-01-27T12:35:57    Physicians   Panel Physicians Referring Physician Case Authorizing Physician  Lorretta Harp, MD (Primary)    Procedures   RIGHT/LEFT HEART CATH AND CORONARY ANGIOGRAPHY  Conclusion   Sharon Figueroa is a 57 y.o. female    [id number] LOCATION:  FACILITY: [facility]  PHYSICIAN: Quay Burow, M.D. 12-20-1960   DATE OF PROCEDURE:  04/01/2017  DATE OF DISCHARGE:     CARDIAC CATHETERIZATION     History obtained from chart review.57 y.o.femalewith past medical history of mitral regurgitation admitted with new onset atrial fibrillation and congestive heart failure. Her 2-D echo revealed preserved LV function, mild left ear/AI and severe Sharon with severe left atrial enlargement. She presents now for right and left heart cath to define her anatomy and physiology in anticipation of mitral /aortic valve replacement plus or minus surgical left atrial MAZE  procedure.   IMPRESSION: Sharon Figueroa has normal coronary arteries, minimal aortic valve gradient on pullback and a 22 mm transmitral pressure  gradient on simultaneous LV and wedge pressure tracings. Agree aortic and mitral valve replacement and surgical MA ZE procedure for atrial fibrillation. A right femoral angina was performed and a MYNX closure device was successfully deployed achieving hemostasis. The patient left the lab in stable  condition.  Quay Burow. MD, [facility] 04/01/2017 1:15 PM      Indications   Paroxysmal atrial fibrillation (HCC) [I48.0 (ICD-10-CM)]  Rheumatic heart disease [I09.9 (ICD-10-CM)]  Severe mitral stenosis by prior echocardiogram [I05.0 (ICD-10-CM)]  Procedural Details/Technique   Technical Details PROCEDURE DESCRIPTION:   The patient was brought to the second floor [facility] cath lab in the postabsorptive state. She was not premedicated . Her right groin was prepped and shaved in usual sterile fashion. Xylocaine 1% was used for local anesthesia. A 5 French sheath was inserted into the right common femoral artery using standard Seldinger technique. A 7 French sheath was inserted into the right common femoral vein. A 5 French right and left Judkins diagnostic catheters were used for selective coronary angiography and obtaining left heart pressures. A 7 [place] balloontipped thermodilution Swan-Ganz catheter was used to obtain sequential right heart pressures and blood samples for the determination of Fick and thermodilution cardiac outputs.   Estimated blood loss <50 mL.  During this procedure no sedation was administered.  Coronary Findings   Diagnostic  Dominance: Right  No diagnostic findings have been documented.  Intervention   No interventions have been documented.  Right Heart   Right Heart Pressures Right atrial pressure-6/6 Right ventricular pressure-56/2 Pulmonary artery pressure-56/28, mean 42 Pulmonary wedge pressure-amylase 31, V wave 35, mean 29 mixed mitral gradient-22.5 mmHg- Cardiac output-thermodilution: 5.6 L/m Cardiac output- Fick : 3.4 L/m  Coronary Diagrams   Diagnostic Diagram       Implants     Vascular Products  Device Closure Mynxgrip 38f-- [id number] Implanted    Inventory item: Device Closure Mynxgrip 564fodel/Cat number: [id number]: [employer] Lot number: [id number] identifier: [id number]  identifier type: GS1  GUDID Information   Request status Successful    Brand name: MYCrestwood San Jose Psychiatric Health Facilityersion/Model: MXTerlinguaame: [employer]. MRI safety info as of 04/01/17: Labeling does not contain MRI Safety Information  Contains dry or latex rubber: No    GMDN P.T. name: Wound hydrogel dressing, sterile    As of 04/01/2017   Status: Implanted      MERGE Images   Show images for CARDIAC CATHETERIZATION   Link to Procedure Log   Procedure Log    Hemo Data    Most Recent Value  Fick Cardiac Output 3.41 L/min  Fick Cardiac Output Index 2.08 (L/min)/BSA  Thermal Cardiac Output 5.59 L/min  Thermal Cardiac Output Index 41 (L/min)/BSA  Mitral Mean Gradient 22.5 mmHg  Mitral Peak Gradient 23 mmHg  Mitral Valve Area Index 0.64 cm2/BSA  RA A Wave 6 mmHg  RA V Wave 6 mmHg  RA Mean 4 mmHg  RV Systolic Pressure 56 mmHg  RV Diastolic Pressure 2 mmHg  RV EDP 4 mmHg  PA Systolic Pressure 56 mmHg  PA Diastolic Pressure 28 mmHg  PA Mean 42 mmHg  PW A Wave 31 mmHg  PW V Wave 37 mmHg  PW Mean 31 mmHg  AO Systolic Pressure 10440mHg  AO Diastolic Pressure 63 mmHg  AO Mean 79 mmHg  LV Systolic Pressure 12102mHg  LV Diastolic Pressure -2 mmHg  LV EDP 8 mmHg  Arterial Occlusion Pressure Extended Systolic Pressure  123 mmHg  Arterial Occlusion Pressure Extended Diastolic Pressure 61 mmHg  Arterial Occlusion Pressure Extended Mean Pressure 85 mmHg  Left Ventricular Apex Extended Systolic Pressure 827 mmHg  Left Ventricular Apex Extended Diastolic Pressure 0 mmHg  Left Ventricular Apex Extended EDP Pressure 7 mmHg  TPVR Index 12.32 HRUI  TSVR Index 23.18 HRUI  PVR SVR Ratio 0.17  TPVR/TSVR Ratio 0.53    Assessment/Plan:  This 57 year old woman has 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  Her chest x-ray showed pulmonary edema on presentation and she has improved markedly with diuresis.  She had atrial flutter with a rapid ventricular response on presentation and is currently in with a controlled rate.  I have personally reviewed her echocardiogram and cardiac catheterization. Her echocardiogram shows severe thickening of the mitral valve with fusion of the leaflets and a mean gradient of 23 mmHg consistent with severe mitral stenosis.  Her aortic valve is trileaflet with moderately calcified leaflets with mild stenosis and a mean gradient of 10 mmHg.  The AI pressure half-time is 329 Sharon consistent with moderate aortic insufficiency.  I think the best treatment for this patient is replacement of the mitral and aortic valves with a Maze procedure.  She is almost 57 years old and I think mechanical valves would be the best option for her since she is having 2 valves replaced and may be on chronic anticoagulation due to atrial fibrillation/flutter.  She has no contraindications to anticoagulation. I discussed the operative procedure with the patient  including alternatives, benefits and risks; including but not limited to bleeding, blood transfusion, infection, stroke, myocardial infarction, recurrent or persistent atrial fibrillation or flutter,  heart block requiring a permanent pacemaker, organ dysfunction, and death.  I discussed the need for lifelong anticoagulation with mechanical valves.  I discussed the alternative of using tissue valves but I think she would still likely need to be on lifelong anticoagulation with 2 tissue valves and atrial fibrillation.  Sharon Figueroa understands and agrees to proceed.  We will schedule surgery for Thursday or Friday of this week.  I told her that I would return tomorrow to see what other question she had and make further operative plans.  I spent 60 minutes  performing this consultation and > 50% of this time was spent face to face counseling and coordinating the care of this patient's severe mitral stenosis, mild aortic stenosis and moderate a and atrial fibrillation/flutter.  Sharon Figueroa 04/01/2017, 7:54 PM

## 2017-04-01 NOTE — Interval H&P Note (Signed)
Cath Lab Visit (complete for each Cath Lab visit)  Clinical Evaluation Leading to the Procedure:   ACS: No.  Non-ACS:    Anginal Classification: CCS I  Anti-ischemic medical therapy: No Therapy  Non-Invasive Test Results: No non-invasive testing performed  Prior CABG: No previous CABG      History and Physical Interval Note:  04/01/2017 12:22 PM  Sharon MinerLinda Woodberry  has presented today for surgery, with the diagnosis of unstable angina  The various methods of treatment have been discussed with the patient and family. After consideration of risks, benefits and other options for treatment, the patient has consented to  Procedure(s): LEFT HEART CATH AND CORONARY ANGIOGRAPHY (N/A) as a surgical intervention .  The patient's history has been reviewed, patient examined, no change in status, stable for surgery.  I have reviewed the patient's chart and labs.  Questions were answered to the patient's satisfaction.     Nanetta BattyJonathan Deya Bigos

## 2017-04-01 NOTE — Progress Notes (Signed)
Progress Note  Patient Name: Sharon MinerLinda Crevier Date of Encounter: 04/01/2017  Primary Cardiologist: Olga MillersBrian Kc Sedlak, MD   Subjective   No dyspnea or chest pain  Inpatient Medications    Scheduled Meds: . diltiazem  30 mg Oral Q6H  . furosemide  20 mg Intravenous BID  . sodium chloride flush  3 mL Intravenous Q12H   Continuous Infusions: . sodium chloride    . sodium chloride     PRN Meds: sodium chloride, acetaminophen, ondansetron (ZOFRAN) IV, sodium chloride flush   Vital Signs    Vitals:   03/31/17 1931 04/01/17 0007 04/01/17 0452 04/01/17 0656  BP: 94/68 102/60 101/61 106/66  Pulse: 91  80   Resp: 16  16   Temp: 97.8 F (36.6 C)  98 F (36.7 C)   TempSrc: Oral  Oral   SpO2: 94%  96%   Weight:   126 lb 3.2 oz (57.2 kg)   Height:        Intake/Output Summary (Last 24 hours) at 04/01/2017 0750 Last data filed at 03/31/2017 1840 Gross per 24 hour  Intake 383.08 ml  Output 2000 ml  Net -1616.92 ml   Filed Weights   03/30/17 1735 03/31/17 0440 04/01/17 0452  Weight: 132 lb 8 oz (60.1 kg) 130 lb 1.6 oz (59 kg) 126 lb 3.2 oz (57.2 kg)    Telemetry    Atrial fibrillation with intermittent tachycardia; Personally Reviewed   Physical Exam   GEN: WD, WN No acute distress.   Neck: No JVD, supple Cardiac: irregular, normal rate Respiratory: Clear to auscultation bilaterally; no wheeze. GI: Soft, nontender, non-distended, no masses  MS: no edema Neuro:  grossly intact   Labs    Chemistry Recent Labs  Lab 03/30/17 1207  NA 136  K 4.2  CL 105  CO2 18*  GLUCOSE 138*  BUN 20  CREATININE 0.98  CALCIUM 9.0  GFRNONAA >60  GFRAA >60  ANIONGAP 13     Hematology Recent Labs  Lab 03/30/17 1207 03/31/17 0652  WBC 8.5 4.7  RBC 4.01 3.54*  HGB 12.2 10.8*  HCT 37.2 33.2*  MCV 92.8 93.8  MCH 30.4 30.5  MCHC 32.8 32.5  RDW 13.3 13.7  PLT 235 190     Recent Labs  Lab 03/30/17 1216  TROPIPOC 0.01      Radiology    Dg Chest 2  View  Result Date: 03/30/2017 CLINICAL DATA:  Shortness of breath.  Chest tightness. EXAM: CHEST  2 VIEW COMPARISON:  None. FINDINGS: Cardiomegaly. The hila and mediastinum are normal. Increased interstitial markings in the lungs, most prominent in the bases. Small effusions based on the lateral view. No other acute abnormalities. IMPRESSION: Pulmonary edema.  Small effusions. Electronically Signed   By: Gerome Samavid  Williams III M.D   On: 03/30/2017 12:34    Patient Profile     57 y.o. female with past medical history of mitral regurgitation admitted with new onset atrial fibrillation and congestive heart failure.  Assessment & Plan    1. New-onset atrial fibrillation-patient remains in atrial fibrillation today; rate elevated at times; change cardizem to 60 mg q 8.  TSH normal.  Echo shows rheumatic MV with severe MS/mild MR and mild AS/moderate MR. Plan R and L cath today; will likely need MVR/AVR/Maze; reinitiate heparin following cath. 2. 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  There is also mild aortic stenosis and moderate aortic insufficiency.  I will review her  echo with colleagues.  Best option is likely mitral valve and aortic valve replacement.  Balloon mitral valvuloplasty could be considered but would not address aortic valve.  Her mitral valve is also severely thickened.  Note she does have pulmonary hypertension.   3. Acute diastolic congestive heart failure-improved.  Likely secondary to atrial fibrillation superimposed on mitral stenosis.  Change Lasix to oral. 4. Tobacco abuse-patient counseled on discontinuing.  For questions or updates, please contact [facility] Please consult [email or web address] for contact info under Cardiology/STEMI.      Signed, Olga Millers, MD  04/01/2017, 7:50 AM

## 2017-04-02 DIAGNOSIS — I050 Rheumatic mitral stenosis: Secondary | ICD-10-CM

## 2017-04-02 LAB — CBC
HCT: 35.3 % — ABNORMAL LOW (ref 36.0–46.0)
Hemoglobin: 12.3 g/dL (ref 12.0–15.0)
MCH: 32.8 pg (ref 26.0–34.0)
MCHC: 34.8 g/dL (ref 30.0–36.0)
MCV: 94.1 fL (ref 78.0–100.0)
Platelets: 233 10*3/uL (ref 150–400)
RBC: 3.75 MIL/uL — ABNORMAL LOW (ref 3.87–5.11)
RDW: 13.6 % (ref 11.5–15.5)
WBC: 6.8 10*3/uL (ref 4.0–10.5)

## 2017-04-02 LAB — BASIC METABOLIC PANEL
Anion gap: 8 (ref 5–15)
BUN: 16 mg/dL (ref 6–20)
CO2: 25 mmol/L (ref 22–32)
Calcium: 8.8 mg/dL — ABNORMAL LOW (ref 8.9–10.3)
Chloride: 106 mmol/L (ref 101–111)
Creatinine, Ser: 0.91 mg/dL (ref 0.44–1.00)
GFR calc Af Amer: >60 mL/min (ref >60)
GFR calc non Af Amer: >60 mL/min (ref >60)
Glucose, Bld: 99 mg/dL (ref 65–99)
Potassium: 4.6 mmol/L (ref 3.5–5.1)
Sodium: 139 mmol/L (ref 135–145)

## 2017-04-02 LAB — HEPARIN LEVEL (UNFRACTIONATED): Heparin Unfractionated: 0.4 IU/mL (ref 0.30–0.70)

## 2017-04-02 LAB — APTT: aPTT: 71 seconds — ABNORMAL HIGH (ref 24–36)

## 2017-04-02 MED ORDER — HEPARIN (PORCINE) IN NACL 100-0.45 UNIT/ML-% IJ SOLN
1100.0000 [IU]/h | INTRAMUSCULAR | Status: DC
  Administered 2017-04-02 – 2017-04-03 (×2): 1100 [IU]/h via INTRAVENOUS
  Filled 2017-04-02 (×3): qty 250

## 2017-04-02 NOTE — Plan of Care (Signed)
Pt oriented to unit, plan of care, and method of reporting concerns. Verbalizes understanding of need to call for assistance when necessary. Call light and bedside table within reach.

## 2017-04-02 NOTE — Progress Notes (Signed)
ANTICOAGULATION CONSULT NOTE - Follow Up Consult  Pharmacy Consult for Heparin Indication: atrial fibrillation  No Known Allergies  Patient Measurements: Height: 5\' 6"  (167.6 cm) Weight: 127 lb 11.2 oz (57.9 kg) IBW/kg (Calculated) : 59.3 Heparin Dosing Weight: 60.1 kg  Vital Signs: Temp: 98.1 F (36.7 C) (01/29 1452) Temp Source: Oral (01/29 1452) BP: 107/79 (01/29 1452) Pulse Rate: 95 (01/29 1452)  Labs: Recent Labs    03/30/17 2225  03/31/17 [id number] 04/01/17 0656 04/02/17 0418 04/02/17 1551  HGB  --    < > 10.8* 12.7 12.3  --   HCT  --   --  33.2* 38.5 35.3*  --   PLT  --   --  190 246 233  --   APTT  --   --   --   --   --  71*  LABPROT  --   --   --  13.1  --   --   INR  --   --   --  1.00  --   --   HEPARINUNFRC 0.15*  --  0.20*  --   --  0.40  CREATININE  --   --   --  0.92 0.91  --    < > = values in this interval not displayed.    Estimated Creatinine Clearance: 61.6 mL/min (by C-G formula based on SCr of 0.91 mg/dL).  Assessment: Anticoag: Heparin for afib 1/26-27 > Eliquis x 1 on 1/27 am, then dc'd d/t cath planned for 1/28 and not resumed until 1/29 am, for valve surgery on 1/31. CBC WNL. APTT 72, HL 0.4 both in goal range.  Goal of Therapy:  Heparin level 0.3-0.7 units/ml aPTT 66-102 seconds Monitor platelets by anticoagulation protocol: Yes    Plan:  Continue heparin at 1100 units/hr Daily HL, aPTT, and CBC in AM AVR/MVR/MAZE on 1/31. Plan mechanical valves   Peytyn Trine S. Merilynn Finlandobertson, PharmD, BCPS Clinical Staff Pharmacist Pager [phone]  Misty Stanleyobertson, Rutha Melgoza Stillinger 04/02/2017,4:23 PM

## 2017-04-02 NOTE — Progress Notes (Signed)
   Progress Note  Patient Name: Sharon MinerLinda Contee Date of Encounter: 04/02/2017  Primary Cardiologist: Olga MillersBrian Karolyne Timmons, MD   Subjective   No dyspnea or CP  Inpatient Medications    Scheduled Meds: . aspirin  81 mg Oral Daily  . diltiazem  60 mg Oral Q8H  . furosemide  20 mg Oral Daily  . potassium chloride  20 mEq Oral Daily  . sodium chloride flush  3 mL Intravenous Q12H   Continuous Infusions: . sodium chloride     PRN Meds: sodium chloride, acetaminophen, morphine injection, ondansetron (ZOFRAN) IV, sodium chloride flush   Vital Signs    Vitals:   04/01/17 1437 04/01/17 1518 04/01/17 2122 04/02/17 0537  BP: 130/71 111/75 106/64 (!) 114/54  Pulse:  89 79 77  Resp:  18  16  Temp:  98.1 F (36.7 C) 98.7 F (37.1 C) 98.7 F (37.1 C)  TempSrc:  Oral Oral Oral  SpO2:  95% 96% 96%  Weight:    127 lb 11.2 oz (57.9 kg)  Height:        Intake/Output Summary (Last 24 hours) at 04/02/2017 0745 Last data filed at 04/02/2017 0540 Gross per 24 hour  Intake 628.75 ml  Output 1700 ml  Net -1071.25 ml   Filed Weights   03/31/17 0440 04/01/17 0452 04/02/17 0537  Weight: 130 lb 1.6 oz (59 kg) 126 lb 3.2 oz (57.2 kg) 127 lb 11.2 oz (57.9 kg)    Telemetry    Atrial fibrillation rate controlled; Personally Reviewed   Physical Exam   GEN: WD, WN NAD Neck: supple Cardiac: irregular Respiratory: CTA GI: Soft, NT/ND MS: no edema Neuro:  no focal findings   Labs    Chemistry Recent Labs  Lab 03/30/17 1207 04/01/17 0656 04/02/17 0418  NA 136 138 139  K 4.2 3.3* 4.6  CL 105 102 106  CO2 18* 26 25  GLUCOSE 138* 92 99  BUN 20 12 16   CREATININE 0.98 0.92 0.91  CALCIUM 9.0 9.1 8.8*  GFRNONAA >60 >60 >60  GFRAA >60 >60 >60  ANIONGAP 13 10 8      Hematology Recent Labs  Lab 03/31/17 0652 04/01/17 0656 04/02/17 0418  WBC 4.7 6.3 6.8  RBC 3.54* 4.05 3.75*  HGB 10.8* 12.7 12.3  HCT 33.2* 38.5 35.3*  MCV 93.8 95.1 94.1  MCH 30.5 31.4 32.8  MCHC 32.5 33.0  34.8  RDW 13.7 13.5 13.6  PLT 190 246 233     Recent Labs  Lab 03/30/17 1216  TROPIPOC 0.01      Patient Profile     57 y.o. female with past medical history of mitral regurgitation admitted with new onset atrial fibrillation and congestive heart failure.   Assessment & Plan    1. New-onset atrial fibrillation-patient remains in atrial fibrillation today; rate controlled; continue cardizem at present dose.  TSH normal.  Echo shows rheumatic MV with severe MS/mild MR and mild AS/moderate AI. Cath shows no CAD. Plan MVR/AVR/Maze; reinitiate heparin prior to surgery. 2. Valvular heart disease-As outlined, plan AVR/MVR/Maze later this week. 3. Acute diastolic congestive heart failure-improved.  Continue present dose of lasix. 4. Tobacco abuse-patient previously counseled on discontinuing.  For questions or updates, please contact [facility] Please consult [email or web address] for contact info under Cardiology/STEMI.      Signed, Olga MillersBrian Illiana Losurdo, MD  04/02/2017, 7:45 AM

## 2017-04-02 NOTE — Progress Notes (Signed)
1 Day Post-Op Procedure(s) (LRB): RIGHT/LEFT HEART CATH AND CORONARY ANGIOGRAPHY (N/A) Subjective: No complaints. No SOB. Has noted heart rate getting fast with ambulation.  Objective: Vital signs in last 24 hours: Temp:  [98.1 F (36.7 C)-98.7 F (37.1 C)] 98.7 F (37.1 C) (01/29 0537) Pulse Rate:  [77-89] 77 (01/29 0537) Cardiac Rhythm: Atrial fibrillation (01/29 0815) Resp:  [16-18] 16 (01/29 0537) BP: (106-130)/(54-75) 114/54 (01/29 0537) SpO2:  [95 %-96 %] 96 % (01/29 0537) Weight:  [57.9 kg (127 lb 11.2 oz)] 57.9 kg (127 lb 11.2 oz) (01/29 0537)  Hemodynamic parameters for last 24 hours:    Intake/Output from previous day: 01/28 0701 - 01/29 0700 In: 628.8 [P.O.:365; I.V.:263.8] Out: 1700 [Urine:1700] Intake/Output this shift: Total I/O In: 28.8 [I.V.:28.8] Out: -   General appearance: alert and cooperative Heart: irregular rate and rhythm. Lungs: clear to auscultation bilaterally  Lab Results: Recent Labs    04/01/17 0656 04/02/17 0418  WBC 6.3 6.8  HGB 12.7 12.3  HCT 38.5 35.3*  PLT 246 233   BMET:  Recent Labs    04/01/17 0656 04/02/17 0418  NA 138 139  K 3.3* 4.6  CL 102 106  CO2 26 25  GLUCOSE 92 99  BUN 12 16  CREATININE 0.92 0.91  CALCIUM 9.1 8.8*    PT/INR:  Recent Labs    04/01/17 0656  LABPROT 13.1  INR 1.00   ABG    Component Value Date/Time   PHART 7.435 04/01/2017 1244   HCO3 21.7 04/01/2017 1244   HCO3 23.9 04/01/2017 1244   TCO2 23 04/01/2017 1244   TCO2 25 04/01/2017 1244   ACIDBASEDEF 2.0 04/01/2017 1244   O2SAT 96.0 04/01/2017 1244   O2SAT 59.0 04/01/2017 1244   CBG (last 3)  No results for input(s): GLUCAP in the last 72 hours.  Assessment/Plan:  Severe rheumatic mitral stenosis, mild AS, moderate AI. She wants to proceed with surgery so will schedule AVR/MVR/MAZE on Thursday. She is in agreement with using mechanical valves.   LOS: 3 days    Alleen BorneBryan K Nainoa Woldt 04/02/2017

## 2017-04-02 NOTE — Progress Notes (Signed)
ANTICOAGULATION CONSULT NOTE - Initial Consult  Pharmacy Consult for heparin Indication: atrial fibrillation  No Known Allergies  Patient Measurements: Height: 5\' 6"  (167.6 cm) Weight: 127 lb 11.2 oz (57.9 kg) IBW/kg (Calculated) : 59.3  Vital Signs: Temp: 98.7 F (37.1 C) (01/29 0537) Temp Source: Oral (01/29 0537) BP: 114/54 (01/29 0537) Pulse Rate: 77 (01/29 0537)  Labs: Recent Labs    03/30/17 1207 03/30/17 2225 03/31/17 0652 04/01/17 0656 04/02/17 0418  HGB 12.2  --  10.8* 12.7 12.3  HCT 37.2  --  33.2* 38.5 35.3*  PLT 235  --  190 246 233  LABPROT  --   --   --  13.1  --   INR  --   --   --  1.00  --   HEPARINUNFRC  --  0.15* 0.20*  --   --   CREATININE 0.98  --   --  0.92 0.91    Estimated Creatinine Clearance: 61.6 mL/min (by C-G formula based on SCr of 0.91 mg/dL).   Medical History: Past Medical History:  Diagnosis Date  . Mitral valve disorder    "leaky" valve diagnosed 30+ yr ago    Medications:  No medications prior to admission.   Scheduled:  . aspirin  81 mg Oral Daily  . diltiazem  60 mg Oral Q8H  . furosemide  20 mg Oral Daily  . potassium chloride  20 mEq Oral Daily  . sodium chloride flush  3 mL Intravenous Q12H   Infusions:  . sodium chloride      Assessment: [age]yo female now post cath, awaiting MAZE procedure, to resume heparin for Afib; was previously on heparin bridge, last dose of Eliquis 1/27 11am.  Goal of Therapy:  Heparin level 0.3-0.7 units/ml aPTT 66-102 seconds Monitor platelets by anticoagulation protocol: Yes   Plan:  Will resume heparin gtt at 1100 units/hr and monitor heparin levels, aPTT (while Eliquis affects anti-Xa), and CBC.  Vernard GamblesVeronda Nash Bolls, PharmD, BCPS  04/02/2017,8:06 AM

## 2017-04-02 NOTE — Progress Notes (Signed)
CARDIAC REHAB PHASE I   PRE:  Rate/Rhythm: 85 afib, 150 in BR    BP: sitting 97/61    SaO2:   MODE:  Ambulation: 400 ft   POST:  Rate/Rhythm: 120 afib, up to 150 at times    BP: sitting 116/70     SaO2:   Pt HR up with activity. She also moves really fast. Encouraged her to move slower (ie walking, going to BR, washing hands) and also encouraged slow deep breathing. Pt asx. BP stable. To bed as she does not have a recliner. Discussed sternal precautions, IS (RN to order one), mobility pre and post op, and d/c planning. Voiced understanding. Gave her materials. Encouraged more walking at slow pace. [id number]   Sharon MassonRandi [name] CES, ACSM 04/02/2017 12:28 PM

## 2017-04-03 ENCOUNTER — Inpatient Hospital Stay (HOSPITAL_COMMUNITY): Payer: Managed Care, Other (non HMO)

## 2017-04-03 DIAGNOSIS — Z01810 Encounter for preprocedural cardiovascular examination: Secondary | ICD-10-CM

## 2017-04-03 LAB — PULMONARY FUNCTION TEST
DL/VA % pred: 76 %
DL/VA: 3.85 ml/min/mmHg/L
DLCO cor % pred: 53 %
DLCO cor: 14.49 ml/min/mmHg
DLCO unc % pred: 48 %
DLCO unc: 13.07 ml/min/mmHg
FEF 25-75 Pre: 1.43 L/sec
FEF2575-%Pred-Pre: 56 %
FEV1-%Pred-Pre: 61 %
FEV1-Pre: 1.7 L
FEV1FVC-%Pred-Pre: 100 %
FEV6-%Pred-Pre: 62 %
FEV6-Pre: 2.16 L
FEV6FVC-%Pred-Pre: 103 %
FVC-%Pred-Pre: 60 %
FVC-Pre: 2.16 L
Pre FEV1/FVC ratio: 79 %
Pre FEV6/FVC Ratio: 100 %
RV % pred: 91 %
RV: 1.89 L
TLC % pred: 75 %
TLC: 4.03 L

## 2017-04-03 LAB — CBC
HCT: 33.2 % — ABNORMAL LOW (ref 36.0–46.0)
Hemoglobin: 10.6 g/dL — ABNORMAL LOW (ref 12.0–15.0)
MCH: 30.1 pg (ref 26.0–34.0)
MCHC: 31.9 g/dL (ref 30.0–36.0)
MCV: 94.3 fL (ref 78.0–100.0)
Platelets: 220 10*3/uL (ref 150–400)
RBC: 3.52 MIL/uL — ABNORMAL LOW (ref 3.87–5.11)
RDW: 13.9 % (ref 11.5–15.5)
WBC: 5.9 10*3/uL (ref 4.0–10.5)

## 2017-04-03 LAB — BASIC METABOLIC PANEL
Anion gap: 7 (ref 5–15)
BUN: 12 mg/dL (ref 6–20)
CO2: 24 mmol/L (ref 22–32)
Calcium: 8.8 mg/dL — ABNORMAL LOW (ref 8.9–10.3)
Chloride: 107 mmol/L (ref 101–111)
Creatinine, Ser: 0.9 mg/dL (ref 0.44–1.00)
GFR calc Af Amer: >60 mL/min (ref >60)
GFR calc non Af Amer: >60 mL/min (ref >60)
Glucose, Bld: 99 mg/dL (ref 65–99)
Potassium: 4.2 mmol/L (ref 3.5–5.1)
Sodium: 138 mmol/L (ref 135–145)

## 2017-04-03 LAB — HEPARIN LEVEL (UNFRACTIONATED): Heparin Unfractionated: 0.46 IU/mL (ref 0.30–0.70)

## 2017-04-03 LAB — APTT: aPTT: 84 seconds — ABNORMAL HIGH (ref 24–36)

## 2017-04-03 LAB — ABO/RH: ABO/RH(D): A POS

## 2017-04-03 MED ORDER — CHLORHEXIDINE GLUCONATE 0.12 % MT SOLN
15.0000 mL | Freq: Once | OROMUCOSAL | Status: AC
  Administered 2017-04-04: 15 mL via OROMUCOSAL
  Filled 2017-04-03: qty 15

## 2017-04-03 MED ORDER — CHLORHEXIDINE GLUCONATE CLOTH 2 % EX PADS
6.0000 | MEDICATED_PAD | Freq: Once | CUTANEOUS | Status: AC
  Administered 2017-04-03: 6 via TOPICAL

## 2017-04-03 MED ORDER — NITROGLYCERIN IN D5W 200-5 MCG/ML-% IV SOLN
2.0000 ug/min | INTRAVENOUS | Status: DC
  Filled 2017-04-03: qty 250

## 2017-04-03 MED ORDER — EPINEPHRINE PF 1 MG/ML IJ SOLN
0.0000 ug/min | INTRAVENOUS | Status: DC
  Filled 2017-04-03: qty 4

## 2017-04-03 MED ORDER — MAGNESIUM SULFATE 50 % IJ SOLN
40.0000 meq | INTRAMUSCULAR | Status: DC
  Filled 2017-04-03: qty 9.85

## 2017-04-03 MED ORDER — POTASSIUM CHLORIDE 2 MEQ/ML IV SOLN
80.0000 meq | INTRAVENOUS | Status: DC
  Filled 2017-04-03: qty 40

## 2017-04-03 MED ORDER — TRANEXAMIC ACID (OHS) BOLUS VIA INFUSION
15.0000 mg/kg | INTRAVENOUS | Status: AC
  Administered 2017-04-04: 868.5 mg via INTRAVENOUS
  Filled 2017-04-03: qty 869

## 2017-04-03 MED ORDER — DEXTROSE 5 % IV SOLN
750.0000 mg | INTRAVENOUS | Status: DC
  Filled 2017-04-03: qty 750

## 2017-04-03 MED ORDER — BISACODYL 5 MG PO TBEC
5.0000 mg | DELAYED_RELEASE_TABLET | Freq: Once | ORAL | Status: AC
  Administered 2017-04-03: 5 mg via ORAL
  Filled 2017-04-03: qty 1

## 2017-04-03 MED ORDER — DOPAMINE-DEXTROSE 3.2-5 MG/ML-% IV SOLN
0.0000 ug/kg/min | INTRAVENOUS | Status: AC
  Administered 2017-04-04: 3 ug/kg/min via INTRAVENOUS
  Filled 2017-04-03: qty 250

## 2017-04-03 MED ORDER — ALPRAZOLAM 0.25 MG PO TABS: 0.2500 mg | ORAL_TABLET | ORAL | Status: DC | PRN

## 2017-04-03 MED ORDER — VANCOMYCIN HCL 10 G IV SOLR
1250.0000 mg | INTRAVENOUS | Status: AC
  Administered 2017-04-04: 1250 mg via INTRAVENOUS
  Filled 2017-04-03: qty 1250

## 2017-04-03 MED ORDER — DEXMEDETOMIDINE HCL IN NACL 400 MCG/100ML IV SOLN
0.1000 ug/kg/h | INTRAVENOUS | Status: AC
  Administered 2017-04-04: .3 ug/kg/h via INTRAVENOUS
  Filled 2017-04-03: qty 100

## 2017-04-03 MED ORDER — MILRINONE LACTATE IN DEXTROSE 20-5 MG/100ML-% IV SOLN
0.1250 ug/kg/min | INTRAVENOUS | Status: AC
  Administered 2017-04-04: 0.25 ug/kg/min via INTRAVENOUS
  Filled 2017-04-03: qty 100

## 2017-04-03 MED ORDER — CHLORHEXIDINE GLUCONATE CLOTH 2 % EX PADS
6.0000 | MEDICATED_PAD | Freq: Once | CUTANEOUS | Status: AC
  Administered 2017-04-04: 6 via TOPICAL

## 2017-04-03 MED ORDER — METOPROLOL TARTRATE 12.5 MG HALF TABLET
12.5000 mg | ORAL_TABLET | Freq: Once | ORAL | Status: AC
  Administered 2017-04-04: 12.5 mg via ORAL
  Filled 2017-04-03: qty 1

## 2017-04-03 MED ORDER — PLASMA-LYTE 148 IV SOLN
INTRAVENOUS | Status: AC
  Administered 2017-04-04: 500 mL
  Filled 2017-04-03: qty 2.5

## 2017-04-03 MED ORDER — TRANEXAMIC ACID 1000 MG/10ML IV SOLN
1.5000 mg/kg/h | INTRAVENOUS | Status: AC
  Administered 2017-04-04: 1.5 mg/kg/h via INTRAVENOUS
  Filled 2017-04-03: qty 25

## 2017-04-03 MED ORDER — DIAZEPAM 5 MG PO TABS
5.0000 mg | ORAL_TABLET | Freq: Once | ORAL | Status: AC
  Administered 2017-04-04: 5 mg via ORAL
  Filled 2017-04-03: qty 1

## 2017-04-03 MED ORDER — INSULIN REGULAR HUMAN 100 UNIT/ML IJ SOLN
INTRAMUSCULAR | Status: AC
  Administered 2017-04-04: .9 [IU]/h via INTRAVENOUS
  Filled 2017-04-03: qty 1

## 2017-04-03 MED ORDER — TRANEXAMIC ACID (OHS) PUMP PRIME SOLUTION
2.0000 mg/kg | INTRAVENOUS | Status: DC
  Filled 2017-04-03: qty 1.16

## 2017-04-03 MED ORDER — SODIUM CHLORIDE 0.9 % IV SOLN
INTRAVENOUS | Status: DC
  Filled 2017-04-03: qty 30

## 2017-04-03 MED ORDER — TEMAZEPAM 15 MG PO CAPS
15.0000 mg | ORAL_CAPSULE | Freq: Once | ORAL | Status: AC | PRN
  Administered 2017-04-03: 15 mg via ORAL
  Filled 2017-04-03: qty 1

## 2017-04-03 MED ORDER — DEXTROSE 5 % IV SOLN
1.5000 g | INTRAVENOUS | Status: AC
  Administered 2017-04-04: 1.5 g via INTRAVENOUS
  Filled 2017-04-03: qty 1.5

## 2017-04-03 MED ORDER — SODIUM CHLORIDE 0.9 % IV SOLN
30.0000 ug/min | INTRAVENOUS | Status: AC
  Administered 2017-04-04: 10 ug/min via INTRAVENOUS
  Administered 2017-04-04: 30 ug/min via INTRAVENOUS
  Filled 2017-04-03: qty 2

## 2017-04-03 NOTE — Progress Notes (Signed)
Carotid duplex prelim: no significant carotid artery stenosis. UE Doppler: bilateral palmar arch obliterates with radial compression, normal with ulnar compression. Farrel DemarkJill Eunice, RDMS, RVT

## 2017-04-03 NOTE — Plan of Care (Signed)
Pt up ad lib. Ambulates independently with ease. Educated on use of call light system. Verbalizes understanding of need to call for assistance prior to ambulation if necessary.

## 2017-04-03 NOTE — Progress Notes (Signed)
   Progress Note  Patient Name: Sharon Figueroa Date of Encounter: 04/03/2017  Primary Cardiologist: Olga MillersBrian Harley Fitzwater, MD   Subjective   Denies CP or dyspnea  Inpatient Medications    Scheduled Meds: . aspirin  81 mg Oral Daily  . diltiazem  60 mg Oral Q8H  . furosemide  20 mg Oral Daily  . potassium chloride  20 mEq Oral Daily  . sodium chloride flush  3 mL Intravenous Q12H   Continuous Infusions: . sodium chloride    . heparin 1,100 Units/hr (04/03/17 0417)   PRN Meds: sodium chloride, acetaminophen, morphine injection, ondansetron (ZOFRAN) IV, sodium chloride flush   Vital Signs    Vitals:   04/02/17 0537 04/02/17 1452 04/02/17 1940 04/03/17 0459  BP: (!) 114/54 107/79 (!) 106/57 96/64  Pulse: 77 95 79 64  Resp: 16 18 16 14   Temp: 98.7 F (37.1 C) 98.1 F (36.7 C) 98.3 F (36.8 C) 98.3 F (36.8 C)  TempSrc: Oral Oral Oral Oral  SpO2: 96% 97% 96% 95%  Weight: 127 lb 11.2 oz (57.9 kg)   127 lb 11.2 oz (57.9 kg)  Height:        Intake/Output Summary (Last 24 hours) at 04/03/2017 0821 Last data filed at 04/03/2017 0009 Gross per 24 hour  Intake 773.43 ml  Output 801 ml  Net -27.57 ml   Filed Weights   04/01/17 0452 04/02/17 0537 04/03/17 0459  Weight: 126 lb 3.2 oz (57.2 kg) 127 lb 11.2 oz (57.9 kg) 127 lb 11.2 oz (57.9 kg)    Telemetry    Atrial fibrillation rate controlled; Personally Reviewed   Physical Exam   GEN: NAD Neck: No JVD Cardiac: irregular Respiratory: CTA GI: Soft, NT/ND, no masses MS: No edema Neuro:  No focal findings   Labs    Chemistry Recent Labs  Lab 04/01/17 0656 04/02/17 0418 04/03/17 0418  NA 138 139 138  K 3.3* 4.6 4.2  CL 102 106 107  CO2 26 25 24   GLUCOSE 92 99 99  BUN 12 16 12   CREATININE 0.92 0.91 0.90  CALCIUM 9.1 8.8* 8.8*  GFRNONAA >60 >60 >60  GFRAA >60 >60 >60  ANIONGAP 10 8 7      Hematology Recent Labs  Lab 04/01/17 0656 04/02/17 0418 04/03/17 0418  WBC 6.3 6.8 5.9  RBC 4.05 3.75* 3.52*    HGB 12.7 12.3 10.6*  HCT 38.5 35.3* 33.2*  MCV 95.1 94.1 94.3  MCH 31.4 32.8 30.1  MCHC 33.0 34.8 31.9  RDW 13.5 13.6 13.9  PLT 246 233 220     Recent Labs  Lab 03/30/17 1216  TROPIPOC 0.01      Patient Profile     57 y.o. female with past medical history of mitral regurgitation admitted with new onset atrial fibrillation and congestive heart failure.   Assessment & Plan    1. New-onset atrial fibrillation-continue cardizem for rate control of atrial fibrillation.  TSH normal.  Echo shows rheumatic MV with severe MS/mild MR and mild AS/moderate AI. Cath shows no CAD. Plan MVR/AVR/Maze in AM; continue heparin preop. 2. Valvular heart disease-As outlined, plan AVR/MVR/Maze in AM. 3. Acute diastolic congestive heart failure-improved .  Continue present dose of lasix. 4. Tobacco abuse-patient previously counseled on discontinuing.  For questions or updates, please contact [facility] Please consult [email or web address] for contact info under Cardiology/STEMI.      Signed, Olga MillersBrian Davona Kinoshita, MD  04/03/2017, 8:21 AM

## 2017-04-03 NOTE — Anesthesia Preprocedure Evaluation (Addendum)
Anesthesia Evaluation  Patient identified by MRN, date of birth, ID band Patient awake    Reviewed: Allergy & Precautions, NPO status , Patient's Chart, lab work & pertinent test results  Airway Mallampati: III  TM Distance: >3 FB Neck ROM: Full    Dental no notable dental hx.    Pulmonary Current Smoker,    Pulmonary exam normal breath sounds clear to auscultation       Cardiovascular Normal cardiovascular exam+ Valvular Problems/Murmurs (MS) AI  Rhythm:Regular Rate:Normal  Echo - Left ventricle: The cavity size was normal. Systolic function was mildly reduced. The estimated ejection fraction was in the range of 45% to 50%. Diffuse hypokinesis. - Aortic valve: There was mild stenosis. There was moderate regurgitation. Valve area (VTI): 1.68 cm^2. Valve area (Vmax): 1.38 cm^2. Valve area (Vmean): 1.51 cm^2. - Mitral valve: Severe thickening, consistent with rheumatic disease. The findings are consistent with severe stenosis. There was mild regurgitation. Valve area by pressure half-time: 0.81   cm^2. Valve area by continuity equation (using LVOT flow): 0.55   cm^2. - Left atrium: The atrium was severely dilated. - Pulmonary arteries: Systolic pressure was severely increased. PA peak pressure: 72 mm Hg (S). - Pericardium, extracardiac: A trivial pericardial effusion was   identified.  Impressions:  - Mild global reduction in LV systolic function; calcified aortic valve with mild AS and moderate AI; thickened, rheumatic MV with severe MS and mild MR; severe LAE; mild TR with severely elevated pulmonary pressure.   Neuro/Psych negative neurological ROS  negative psych ROS   GI/Hepatic negative GI ROS, Neg liver ROS,   Endo/Other  negative endocrine ROS  Renal/GU negative Renal ROS     Musculoskeletal negative musculoskeletal ROS (+)   Abdominal   Peds  Hematology negative hematology ROS (+)   Anesthesia Other  Findings MS AI  Reproductive/Obstetrics                           Anesthesia Physical Anesthesia Plan  ASA: IV  Anesthesia Plan: General   Post-op Pain Management:    Induction: Intravenous  PONV Risk Score and Plan: 2 and Ondansetron, Treatment may vary due to age or medical condition and Midazolam  Airway Management Planned: Oral ETT  Additional Equipment: Arterial line, CVP, PA Cath, TEE and Ultrasound Guidance Line Placement  Intra-op Plan:   Post-operative Plan: Post-operative intubation/ventilation  Informed Consent: I have reviewed the patients History and Physical, chart, labs and discussed the procedure including the risks, benefits and alternatives for the proposed anesthesia with the patient or authorized representative who has indicated his/her understanding and acceptance.   Dental advisory given  Plan Discussed with: CRNA  Anesthesia Plan Comments:        Anesthesia Quick Evaluation

## 2017-04-03 NOTE — Plan of Care (Signed)
Pt up ad lib. Ambulates independently with ease. Educated on use of call light system. Verbalizes understanding of need to call for assistance prior to ambulation when necessary. 

## 2017-04-03 NOTE — Progress Notes (Signed)
ANTICOAGULATION CONSULT NOTE - Follow Up Consult  Pharmacy Consult for Heparin Indication: atrial fibrillation  No Known Allergies  Patient Measurements: Height: 5\' 6"  (167.6 cm) Weight: 127 lb 11.2 oz (57.9 kg) IBW/kg (Calculated) : 59.3 Heparin Dosing Weight: 58 kg  Vital Signs: Temp: 98.3 F (36.8 C) (01/30 0459) Temp Source: Oral (01/30 0459) BP: 103/62 (01/30 0853) Pulse Rate: 64 (01/30 0459)  Labs: Recent Labs    04/01/17 0656 04/02/17 0418 04/02/17 1551 04/03/17 0418  HGB 12.7 12.3  --  10.6*  HCT 38.5 35.3*  --  33.2*  PLT 246 233  --  220  APTT  --   --  71* 84*  LABPROT 13.1  --   --   --   INR 1.00  --   --   --   HEPARINUNFRC  --   --  0.40 0.46  CREATININE 0.92 0.91  --  0.90    Estimated Creatinine Clearance: 62.3 mL/min (by C-G formula based on SCr of 0.9 mg/dL).  Assessment:   Continues on IV heparin for atrial fibrillation.  S/p Eliquis x 1 on 1/27.    Heparin level remains therapeutic (0.46) on 1100 units/hr. APTT 84, also therapeutic, and correlating with heparin level. For valve surgery on 04/04/17.  Goal of Therapy:  Heparin level 0.3-0.7 units/ml aPTT 66-102 seconds Monitor platelets by anticoagulation protocol: Yes   Plan:   Continue heparin drip at 1100 units/hr.  Daily heparin level and CBC while on heparin.  Valve surgery in am.  Dennie Fettersgan, Frona Yost Donovan, RPh Pager: [phone] 04/03/2017,11:07 AM

## 2017-04-04 ENCOUNTER — Encounter (HOSPITAL_COMMUNITY)
Admission: EM | Disposition: A | Discharge status: Home / Self Care | Payer: Self-pay | Source: Home / Self Care | Attending: Surgery

## 2017-04-04 ENCOUNTER — Inpatient Hospital Stay (HOSPITAL_COMMUNITY): Payer: Managed Care, Other (non HMO)

## 2017-04-04 ENCOUNTER — Inpatient Hospital Stay (HOSPITAL_COMMUNITY): Payer: Managed Care, Other (non HMO) | Admitting: Certified Registered Nurse Anesthetist

## 2017-04-04 DIAGNOSIS — I481 Persistent atrial fibrillation: Secondary | ICD-10-CM

## 2017-04-04 DIAGNOSIS — Z952 Presence of prosthetic heart valve: Secondary | ICD-10-CM

## 2017-04-04 DIAGNOSIS — I342 Nonrheumatic mitral (valve) stenosis: Secondary | ICD-10-CM

## 2017-04-04 DIAGNOSIS — I350 Nonrheumatic aortic (valve) stenosis: Secondary | ICD-10-CM

## 2017-04-04 HISTORY — PX: AORTIC VALVE REPLACEMENT: SHX41

## 2017-04-04 HISTORY — PX: MAZE: SHX5063

## 2017-04-04 HISTORY — PX: MITRAL VALVE REPLACEMENT: SHX147

## 2017-04-04 HISTORY — PX: TEE WITHOUT CARDIOVERSION: SHX5443

## 2017-04-04 LAB — POCT I-STAT, CHEM 8
BUN: 11 mg/dL (ref 6–20)
BUN: 10 mg/dL (ref 6–20)
BUN: 9 mg/dL (ref 6–20)
BUN: 10 mg/dL (ref 6–20)
BUN: 12 mg/dL (ref 6–20)
BUN: 12 mg/dL (ref 6–20)
BUN: 12 mg/dL (ref 6–20)
BUN: 11 mg/dL (ref 6–20)
Calcium, Ion: 1.22 mmol/L (ref 1.15–1.40)
Calcium, Ion: 1.02 mmol/L — ABNORMAL LOW (ref 1.15–1.40)
Calcium, Ion: 1.23 mmol/L (ref 1.15–1.40)
Calcium, Ion: 1.07 mmol/L — ABNORMAL LOW (ref 1.15–1.40)
Calcium, Ion: 1.07 mmol/L — ABNORMAL LOW (ref 1.15–1.40)
Calcium, Ion: 0.97 mmol/L — ABNORMAL LOW (ref 1.15–1.40)
Calcium, Ion: 1.05 mmol/L — ABNORMAL LOW (ref 1.15–1.40)
Calcium, Ion: 1.08 mmol/L — ABNORMAL LOW (ref 1.15–1.40)
Chloride: 104 mmol/L (ref 101–111)
Chloride: 102 mmol/L (ref 101–111)
Chloride: 104 mmol/L (ref 101–111)
Chloride: 107 mmol/L (ref 101–111)
Chloride: 105 mmol/L (ref 101–111)
Chloride: 108 mmol/L (ref 101–111)
Chloride: 106 mmol/L (ref 101–111)
Chloride: 107 mmol/L (ref 101–111)
Creatinine, Ser: 0.6 mg/dL (ref 0.44–1.00)
Creatinine, Ser: 0.5 mg/dL (ref 0.44–1.00)
Creatinine, Ser: 0.7 mg/dL (ref 0.44–1.00)
Creatinine, Ser: 0.6 mg/dL (ref 0.44–1.00)
Creatinine, Ser: 0.5 mg/dL (ref 0.44–1.00)
Creatinine, Ser: 0.5 mg/dL (ref 0.44–1.00)
Creatinine, Ser: 0.6 mg/dL (ref 0.44–1.00)
Creatinine, Ser: 0.6 mg/dL (ref 0.44–1.00)
Glucose, Bld: 105 mg/dL — ABNORMAL HIGH (ref 65–99)
Glucose, Bld: 100 mg/dL — ABNORMAL HIGH (ref 65–99)
Glucose, Bld: 118 mg/dL — ABNORMAL HIGH (ref 65–99)
Glucose, Bld: 143 mg/dL — ABNORMAL HIGH (ref 65–99)
Glucose, Bld: 129 mg/dL — ABNORMAL HIGH (ref 65–99)
Glucose, Bld: 108 mg/dL — ABNORMAL HIGH (ref 65–99)
Glucose, Bld: 159 mg/dL — ABNORMAL HIGH (ref 65–99)
Glucose, Bld: 145 mg/dL — ABNORMAL HIGH (ref 65–99)
HCT: 29 % — ABNORMAL LOW (ref 36.0–46.0)
HCT: 24 % — ABNORMAL LOW (ref 36.0–46.0)
HCT: 27 % — ABNORMAL LOW (ref 36.0–46.0)
HCT: 22 % — ABNORMAL LOW (ref 36.0–46.0)
HCT: 22 % — ABNORMAL LOW (ref 36.0–46.0)
HCT: 23 % — ABNORMAL LOW (ref 36.0–46.0)
HCT: 25 % — ABNORMAL LOW (ref 36.0–46.0)
HCT: 25 % — ABNORMAL LOW (ref 36.0–46.0)
Hemoglobin: 9.9 g/dL — ABNORMAL LOW (ref 12.0–15.0)
Hemoglobin: 8.2 g/dL — ABNORMAL LOW (ref 12.0–15.0)
Hemoglobin: 9.2 g/dL — ABNORMAL LOW (ref 12.0–15.0)
Hemoglobin: 7.5 g/dL — ABNORMAL LOW (ref 12.0–15.0)
Hemoglobin: 7.5 g/dL — ABNORMAL LOW (ref 12.0–15.0)
Hemoglobin: 7.8 g/dL — ABNORMAL LOW (ref 12.0–15.0)
Hemoglobin: 8.5 g/dL — ABNORMAL LOW (ref 12.0–15.0)
Hemoglobin: 8.5 g/dL — ABNORMAL LOW (ref 12.0–15.0)
Potassium: 4 mmol/L (ref 3.5–5.1)
Potassium: 3.9 mmol/L (ref 3.5–5.1)
Potassium: 4 mmol/L (ref 3.5–5.1)
Potassium: 4.7 mmol/L (ref 3.5–5.1)
Potassium: 4.6 mmol/L (ref 3.5–5.1)
Potassium: 6.2 mmol/L — ABNORMAL HIGH (ref 3.5–5.1)
Potassium: 5.3 mmol/L — ABNORMAL HIGH (ref 3.5–5.1)
Potassium: 4.7 mmol/L (ref 3.5–5.1)
Sodium: 140 mmol/L (ref 135–145)
Sodium: 140 mmol/L (ref 135–145)
Sodium: 140 mmol/L (ref 135–145)
Sodium: 136 mmol/L (ref 135–145)
Sodium: 138 mmol/L (ref 135–145)
Sodium: 136 mmol/L (ref 135–145)
Sodium: 139 mmol/L (ref 135–145)
Sodium: 139 mmol/L (ref 135–145)
TCO2: 28 mmol/L (ref 22–32)
TCO2: 26 mmol/L (ref 22–32)
TCO2: 28 mmol/L (ref 22–32)
TCO2: 26 mmol/L (ref 22–32)
TCO2: 27 mmol/L (ref 22–32)
TCO2: 26 mmol/L (ref 22–32)
TCO2: 24 mmol/L (ref 22–32)
TCO2: 20 mmol/L — ABNORMAL LOW (ref 22–32)

## 2017-04-04 LAB — CBC
HCT: 36.6 % (ref 36.0–46.0)
HCT: 32.2 % — ABNORMAL LOW (ref 36.0–46.0)
HCT: 28.3 % — ABNORMAL LOW (ref 36.0–46.0)
Hemoglobin: 11.8 g/dL — ABNORMAL LOW (ref 12.0–15.0)
Hemoglobin: 10.9 g/dL — ABNORMAL LOW (ref 12.0–15.0)
Hemoglobin: 9.4 g/dL — ABNORMAL LOW (ref 12.0–15.0)
MCH: 30.5 pg (ref 26.0–34.0)
MCH: 31.2 pg (ref 26.0–34.0)
MCH: 30.2 pg (ref 26.0–34.0)
MCHC: 32.2 g/dL (ref 30.0–36.0)
MCHC: 33.9 g/dL (ref 30.0–36.0)
MCHC: 33.2 g/dL (ref 30.0–36.0)
MCV: 94.6 fL (ref 78.0–100.0)
MCV: 92.3 fL (ref 78.0–100.0)
MCV: 91 fL (ref 78.0–100.0)
Platelets: 252 10*3/uL (ref 150–400)
Platelets: 121 10*3/uL — ABNORMAL LOW (ref 150–400)
Platelets: 121 10*3/uL — ABNORMAL LOW (ref 150–400)
RBC: 3.87 MIL/uL (ref 3.87–5.11)
RBC: 3.49 MIL/uL — ABNORMAL LOW (ref 3.87–5.11)
RBC: 3.11 MIL/uL — ABNORMAL LOW (ref 3.87–5.11)
RDW: 13.9 % (ref 11.5–15.5)
RDW: 14.6 % (ref 11.5–15.5)
RDW: 14.4 % (ref 11.5–15.5)
WBC: 6.5 10*3/uL (ref 4.0–10.5)
WBC: 11.1 10*3/uL — ABNORMAL HIGH (ref 4.0–10.5)
WBC: 14.3 10*3/uL — ABNORMAL HIGH (ref 4.0–10.5)

## 2017-04-04 LAB — SURGICAL PCR SCREEN
MRSA, PCR: NEGATIVE
Staphylococcus aureus: NEGATIVE

## 2017-04-04 LAB — POCT I-STAT 3, ART BLOOD GAS (G3+)
Acid-Base Excess: 3 mmol/L — ABNORMAL HIGH (ref 0.0–2.0)
Acid-base deficit: 1 mmol/L (ref 0.0–2.0)
Acid-base deficit: 4 mmol/L — ABNORMAL HIGH (ref 0.0–2.0)
Acid-base deficit: 4 mmol/L — ABNORMAL HIGH (ref 0.0–2.0)
Acid-base deficit: 6 mmol/L — ABNORMAL HIGH (ref 0.0–2.0)
Bicarbonate: 25 mmol/L (ref 20.0–28.0)
Bicarbonate: 28.2 mmol/L — ABNORMAL HIGH (ref 20.0–28.0)
Bicarbonate: 21.7 mmol/L (ref 20.0–28.0)
Bicarbonate: 21.3 mmol/L (ref 20.0–28.0)
Bicarbonate: 18.7 mmol/L — ABNORMAL LOW (ref 20.0–28.0)
O2 Saturation: 100 %
O2 Saturation: 100 %
O2 Saturation: 95 %
O2 Saturation: 94 %
O2 Saturation: 89 %
Patient temperature: 36.7
Patient temperature: 36.6
Patient temperature: 36.2
TCO2: 26 mmol/L (ref 22–32)
TCO2: 30 mmol/L (ref 22–32)
TCO2: 23 mmol/L (ref 22–32)
TCO2: 22 mmol/L (ref 22–32)
TCO2: 20 mmol/L — ABNORMAL LOW (ref 22–32)
pCO2 arterial: 44.5 mmHg (ref 32.0–48.0)
pCO2 arterial: 47.8 mmHg (ref 32.0–48.0)
pCO2 arterial: 42.9 mmHg (ref 32.0–48.0)
pCO2 arterial: 35.9 mmHg (ref 32.0–48.0)
pCO2 arterial: 31.8 mmHg — ABNORMAL LOW (ref 32.0–48.0)
pH, Arterial: 7.359 (ref 7.350–7.450)
pH, Arterial: 7.379 (ref 7.350–7.450)
pH, Arterial: 7.31 — ABNORMAL LOW (ref 7.350–7.450)
pH, Arterial: 7.379 (ref 7.350–7.450)
pH, Arterial: 7.375 (ref 7.350–7.450)
pO2, Arterial: 393 mmHg — ABNORMAL HIGH (ref 83.0–108.0)
pO2, Arterial: 386 mmHg — ABNORMAL HIGH (ref 83.0–108.0)
pO2, Arterial: 83 mmHg (ref 83.0–108.0)
pO2, Arterial: 69 mmHg — ABNORMAL LOW (ref 83.0–108.0)
pO2, Arterial: 55 mmHg — ABNORMAL LOW (ref 83.0–108.0)

## 2017-04-04 LAB — CREATININE, SERUM
Creatinine, Ser: 0.75 mg/dL (ref 0.44–1.00)
GFR calc Af Amer: >60 mL/min (ref >60)
GFR calc non Af Amer: >60 mL/min (ref >60)

## 2017-04-04 LAB — BASIC METABOLIC PANEL
Anion gap: 10 (ref 5–15)
BUN: 10 mg/dL (ref 6–20)
CO2: 24 mmol/L (ref 22–32)
Calcium: 9.2 mg/dL (ref 8.9–10.3)
Chloride: 103 mmol/L (ref 101–111)
Creatinine, Ser: 0.9 mg/dL (ref 0.44–1.00)
GFR calc Af Amer: >60 mL/min (ref >60)
GFR calc non Af Amer: >60 mL/min (ref >60)
Glucose, Bld: 99 mg/dL (ref 65–99)
Potassium: 4.2 mmol/L (ref 3.5–5.1)
Sodium: 137 mmol/L (ref 135–145)

## 2017-04-04 LAB — HEPARIN LEVEL (UNFRACTIONATED): Heparin Unfractionated: 0.61 IU/mL (ref 0.30–0.70)

## 2017-04-04 LAB — PROTIME-INR
INR: 1.7
Prothrombin Time: 19.8 seconds — ABNORMAL HIGH (ref 11.4–15.2)

## 2017-04-04 LAB — POCT I-STAT 4, (NA,K, GLUC, HGB,HCT)
Glucose, Bld: 81 mg/dL (ref 65–99)
HCT: 31 % — ABNORMAL LOW (ref 36.0–46.0)
Hemoglobin: 10.5 g/dL — ABNORMAL LOW (ref 12.0–15.0)
Potassium: 4.5 mmol/L (ref 3.5–5.1)
Sodium: 143 mmol/L (ref 135–145)

## 2017-04-04 LAB — PLATELET COUNT: Platelets: 77 10*3/uL — ABNORMAL LOW (ref 150–400)

## 2017-04-04 LAB — PREPARE RBC (CROSSMATCH)

## 2017-04-04 LAB — MAGNESIUM: Magnesium: 4 mg/dL — ABNORMAL HIGH (ref 1.7–2.4)

## 2017-04-04 LAB — APTT: aPTT: 39 seconds — ABNORMAL HIGH (ref 24–36)

## 2017-04-04 LAB — FIBRINOGEN: Fibrinogen: 234 mg/dL (ref 210–475)

## 2017-04-04 SURGERY — REPLACEMENT, AORTIC VALVE, OPEN
Anesthesia: General | Site: Chest

## 2017-04-04 MED ORDER — ACETAMINOPHEN 160 MG/5ML PO SOLN: 1000.0000 mg | Freq: Four times a day (QID) | ORAL | Status: DC

## 2017-04-04 MED ORDER — METOPROLOL TARTRATE 12.5 MG HALF TABLET: 12.5000 mg | ORAL_TABLET | Freq: Two times a day (BID) | ORAL | Status: DC

## 2017-04-04 MED ORDER — BISACODYL 5 MG PO TBEC
10.0000 mg | DELAYED_RELEASE_TABLET | Freq: Every day | ORAL | Status: DC
  Administered 2017-04-05 – 2017-04-07 (×3): 10 mg via ORAL
  Filled 2017-04-04 (×3): qty 2

## 2017-04-04 MED ORDER — ORAL CARE MOUTH RINSE
15.0000 mL | Freq: Four times a day (QID) | OROMUCOSAL | Status: DC
  Administered 2017-04-04 – 2017-04-06 (×6): 15 mL via OROMUCOSAL

## 2017-04-04 MED ORDER — EPHEDRINE SULFATE 50 MG/ML IJ SOLN
INTRAMUSCULAR | Status: DC | PRN
  Administered 2017-04-04: 5 mg via INTRAVENOUS

## 2017-04-04 MED ORDER — LACTATED RINGERS IV SOLN
500.0000 mL | Freq: Once | INTRAVENOUS | Status: AC | PRN
  Administered 2017-04-04: 500 mL via INTRAVENOUS

## 2017-04-04 MED ORDER — FAMOTIDINE IN NACL 20-0.9 MG/50ML-% IV SOLN
20.0000 mg | Freq: Two times a day (BID) | INTRAVENOUS | Status: AC
  Administered 2017-04-04: 20 mg via INTRAVENOUS

## 2017-04-04 MED ORDER — THROMBIN (RECOMBINANT) 20000 UNITS EX SOLR
CUTANEOUS | Status: DC | PRN
  Administered 2017-04-04: 15000 [IU] via TOPICAL

## 2017-04-04 MED ORDER — METOPROLOL TARTRATE 5 MG/5ML IV SOLN: 2.5000 mg | INTRAVENOUS | Status: DC | PRN

## 2017-04-04 MED ORDER — LACTATED RINGERS IV SOLN: INTRAVENOUS | Status: DC

## 2017-04-04 MED ORDER — SODIUM CHLORIDE 0.45 % IV SOLN
INTRAVENOUS | Status: DC | PRN
  Administered 2017-04-04: 15:00:00 via INTRAVENOUS

## 2017-04-04 MED ORDER — MORPHINE SULFATE (PF) 2 MG/ML IV SOLN: 2.0000 mg | INTRAVENOUS | Status: DC | PRN

## 2017-04-04 MED ORDER — LACTATED RINGERS IV SOLN
INTRAVENOUS | Status: DC | PRN
  Administered 2017-04-04 (×2): via INTRAVENOUS

## 2017-04-04 MED ORDER — ASPIRIN EC 325 MG PO TBEC: 325.0000 mg | DELAYED_RELEASE_TABLET | Freq: Every day | ORAL | Status: DC

## 2017-04-04 MED ORDER — HEPARIN SODIUM (PORCINE) 1000 UNIT/ML IJ SOLN
INTRAMUSCULAR | Status: DC | PRN
  Administered 2017-04-04: 20000 [IU] via INTRAVENOUS
  Administered 2017-04-04: 10000 [IU] via INTRAVENOUS

## 2017-04-04 MED ORDER — SODIUM CHLORIDE 0.9% FLUSH
3.0000 mL | INTRAVENOUS | Status: DC | PRN
  Administered 2017-04-06: 9 mL via INTRAVENOUS
  Filled 2017-04-04: qty 3

## 2017-04-04 MED ORDER — MIDAZOLAM HCL 5 MG/5ML IJ SOLN
INTRAMUSCULAR | Status: DC | PRN
  Administered 2017-04-04: 4 mg via INTRAVENOUS
  Administered 2017-04-04: 2 mg via INTRAVENOUS
  Administered 2017-04-04: 4 mg via INTRAVENOUS

## 2017-04-04 MED ORDER — LIDOCAINE 2% (20 MG/ML) 5 ML SYRINGE
INTRAMUSCULAR | Status: AC
  Filled 2017-04-04: qty 5

## 2017-04-04 MED ORDER — PROTAMINE SULFATE 10 MG/ML IV SOLN
INTRAVENOUS | Status: AC
  Filled 2017-04-04: qty 25

## 2017-04-04 MED ORDER — MORPHINE SULFATE (PF) 4 MG/ML IV SOLN
2.0000 mg | INTRAVENOUS | Status: DC | PRN
  Administered 2017-04-05: 4 mg via INTRAVENOUS
  Filled 2017-04-04: qty 1

## 2017-04-04 MED ORDER — ROCURONIUM BROMIDE 10 MG/ML (PF) SYRINGE
PREFILLED_SYRINGE | INTRAVENOUS | Status: AC
  Filled 2017-04-04: qty 5

## 2017-04-04 MED ORDER — DEXTROSE 5 % IV SOLN
INTRAVENOUS | Status: DC | PRN
  Administered 2017-04-04: 750 mg via INTRAVENOUS

## 2017-04-04 MED ORDER — DEXTROSE 5 % IV SOLN
1.5000 g | Freq: Two times a day (BID) | INTRAVENOUS | Status: AC
  Administered 2017-04-04 – 2017-04-06 (×4): 1.5 g via INTRAVENOUS
  Filled 2017-04-04 (×4): qty 1.5

## 2017-04-04 MED ORDER — METOPROLOL TARTRATE 25 MG/10 ML ORAL SUSPENSION: 12.5000 mg | Freq: Two times a day (BID) | ORAL | Status: DC

## 2017-04-04 MED ORDER — SODIUM CHLORIDE 0.9% FLUSH
10.0000 mL | Freq: Two times a day (BID) | INTRAVENOUS | Status: DC
  Administered 2017-04-04 – 2017-04-05 (×3): 10 mL

## 2017-04-04 MED ORDER — OXYCODONE HCL 5 MG PO TABS
5.0000 mg | ORAL_TABLET | ORAL | Status: DC | PRN
  Administered 2017-04-05 (×2): 10 mg via ORAL
  Filled 2017-04-04 (×2): qty 2

## 2017-04-04 MED ORDER — PROPOFOL 10 MG/ML IV BOLUS
INTRAVENOUS | Status: AC
  Filled 2017-04-04: qty 20

## 2017-04-04 MED ORDER — SODIUM CHLORIDE 0.9 % IV SOLN
INTRAVENOUS | Status: DC
  Administered 2017-04-04: 15:00:00 via INTRAVENOUS

## 2017-04-04 MED ORDER — CHLORHEXIDINE GLUCONATE CLOTH 2 % EX PADS
6.0000 | MEDICATED_PAD | Freq: Every day | CUTANEOUS | Status: DC
  Administered 2017-04-04 – 2017-04-05 (×2): 6 via TOPICAL

## 2017-04-04 MED ORDER — MILRINONE LACTATE IN DEXTROSE 20-5 MG/100ML-% IV SOLN
0.2500 ug/kg/min | INTRAVENOUS | Status: DC
  Administered 2017-04-05: 0.25 ug/kg/min via INTRAVENOUS
  Filled 2017-04-04: qty 100

## 2017-04-04 MED ORDER — 0.9 % SODIUM CHLORIDE (POUR BTL) OPTIME
TOPICAL | Status: DC | PRN
  Administered 2017-04-04: 1000 mL
  Administered 2017-04-04: 5000 mL

## 2017-04-04 MED ORDER — SODIUM CHLORIDE 0.9 % IV SOLN
INTRAVENOUS | Status: DC
  Filled 2017-04-04: qty 1

## 2017-04-04 MED ORDER — POTASSIUM CHLORIDE 10 MEQ/50ML IV SOLN: 10.0000 meq | INTRAVENOUS | Status: AC

## 2017-04-04 MED ORDER — ASPIRIN 81 MG PO CHEW: 324.0000 mg | CHEWABLE_TABLET | Freq: Every day | ORAL | Status: DC

## 2017-04-04 MED ORDER — ACETAMINOPHEN 160 MG/5ML PO SOLN: 650.0000 mg | Freq: Once | ORAL | Status: AC

## 2017-04-04 MED ORDER — NITROGLYCERIN IN D5W 200-5 MCG/ML-% IV SOLN: 0.0000 ug/min | INTRAVENOUS | Status: DC

## 2017-04-04 MED ORDER — ARTIFICIAL TEARS OPHTHALMIC OINT
TOPICAL_OINTMENT | OPHTHALMIC | Status: DC | PRN
  Administered 2017-04-04: 1 via OPHTHALMIC

## 2017-04-04 MED ORDER — VANCOMYCIN HCL IN DEXTROSE 1-5 GM/200ML-% IV SOLN
1000.0000 mg | Freq: Once | INTRAVENOUS | Status: AC
  Administered 2017-04-04: 1000 mg via INTRAVENOUS
  Filled 2017-04-04: qty 200

## 2017-04-04 MED ORDER — ARTIFICIAL TEARS OPHTHALMIC OINT
TOPICAL_OINTMENT | OPHTHALMIC | Status: AC
  Filled 2017-04-04: qty 3.5

## 2017-04-04 MED ORDER — PANTOPRAZOLE SODIUM 40 MG PO TBEC
40.0000 mg | DELAYED_RELEASE_TABLET | Freq: Every day | ORAL | Status: DC
  Administered 2017-04-06 – 2017-04-07 (×2): 40 mg via ORAL
  Filled 2017-04-04 (×2): qty 1

## 2017-04-04 MED ORDER — THROMBIN (RECOMBINANT) 5000 UNITS EX SOLR
CUTANEOUS | Status: AC
  Filled 2017-04-04: qty 15000

## 2017-04-04 MED ORDER — DEXMEDETOMIDINE HCL IN NACL 200 MCG/50ML IV SOLN
INTRAVENOUS | Status: AC
  Filled 2017-04-04: qty 50

## 2017-04-04 MED ORDER — PROPOFOL 10 MG/ML IV BOLUS
INTRAVENOUS | Status: DC | PRN
  Administered 2017-04-04: 40 mg via INTRAVENOUS

## 2017-04-04 MED ORDER — ALBUMIN HUMAN 5 % IV SOLN
250.0000 mL | INTRAVENOUS | Status: AC | PRN
  Administered 2017-04-04 (×3): 250 mL via INTRAVENOUS
  Filled 2017-04-04: qty 250

## 2017-04-04 MED ORDER — INSULIN ASPART 100 UNIT/ML ~~LOC~~ SOLN
0.0000 [IU] | SUBCUTANEOUS | Status: DC
  Administered 2017-04-04 – 2017-04-05 (×3): 2 [IU] via SUBCUTANEOUS

## 2017-04-04 MED ORDER — CHLORHEXIDINE GLUCONATE 0.12 % MT SOLN
15.0000 mL | OROMUCOSAL | Status: AC
  Administered 2017-04-04: 15 mL via OROMUCOSAL

## 2017-04-04 MED ORDER — SODIUM CHLORIDE 0.9 % IV SOLN
0.0000 ug/kg/h | INTRAVENOUS | Status: DC
  Filled 2017-04-04: qty 2

## 2017-04-04 MED ORDER — TRAMADOL HCL 50 MG PO TABS: 50.0000 mg | ORAL_TABLET | ORAL | Status: DC | PRN

## 2017-04-04 MED ORDER — SODIUM CHLORIDE 0.9% FLUSH
3.0000 mL | Freq: Two times a day (BID) | INTRAVENOUS | Status: DC
  Administered 2017-04-05 – 2017-04-07 (×4): 3 mL via INTRAVENOUS

## 2017-04-04 MED ORDER — MICROFIBRILLAR COLL HEMOSTAT EX PADS
MEDICATED_PAD | CUTANEOUS | Status: DC | PRN
  Administered 2017-04-04: 1 via TOPICAL

## 2017-04-04 MED ORDER — SODIUM CHLORIDE 0.9 % IV SOLN: 250.0000 mL | INTRAVENOUS | Status: DC

## 2017-04-04 MED ORDER — HEMOSTATIC AGENTS (NO CHARGE) OPTIME
TOPICAL | Status: DC | PRN
  Administered 2017-04-04 (×3): 1 via TOPICAL

## 2017-04-04 MED ORDER — CHLORHEXIDINE GLUCONATE 0.12% ORAL RINSE (MEDLINE KIT)
15.0000 mL | Freq: Two times a day (BID) | OROMUCOSAL | Status: DC
  Administered 2017-04-04 – 2017-04-05 (×2): 15 mL via OROMUCOSAL

## 2017-04-04 MED ORDER — PROTAMINE SULFATE 10 MG/ML IV SOLN
INTRAVENOUS | Status: DC | PRN
  Administered 2017-04-04: 300 mg via INTRAVENOUS

## 2017-04-04 MED ORDER — PHENYLEPHRINE HCL 10 MG/ML IJ SOLN
INTRAMUSCULAR | Status: DC | PRN
  Administered 2017-04-04: 120 ug via INTRAVENOUS
  Administered 2017-04-04 (×2): 40 ug via INTRAVENOUS

## 2017-04-04 MED ORDER — ALBUMIN HUMAN 5 % IV SOLN
INTRAVENOUS | Status: DC | PRN
  Administered 2017-04-04: 14:00:00 via INTRAVENOUS

## 2017-04-04 MED ORDER — ROCURONIUM BROMIDE 10 MG/ML (PF) SYRINGE
PREFILLED_SYRINGE | INTRAVENOUS | Status: DC | PRN
  Administered 2017-04-04: 100 mg via INTRAVENOUS

## 2017-04-04 MED ORDER — INSULIN REGULAR BOLUS VIA INFUSION
0.0000 [IU] | Freq: Three times a day (TID) | INTRAVENOUS | Status: DC
  Filled 2017-04-04: qty 10

## 2017-04-04 MED ORDER — ONDANSETRON HCL 4 MG/2ML IJ SOLN: 4.0000 mg | Freq: Four times a day (QID) | INTRAMUSCULAR | Status: DC | PRN

## 2017-04-04 MED ORDER — MIDAZOLAM HCL 10 MG/2ML IJ SOLN
INTRAMUSCULAR | Status: AC
  Filled 2017-04-04: qty 2

## 2017-04-04 MED ORDER — BISACODYL 10 MG RE SUPP: 10.0000 mg | Freq: Every day | RECTAL | Status: DC

## 2017-04-04 MED ORDER — DOCUSATE SODIUM 100 MG PO CAPS
200.0000 mg | ORAL_CAPSULE | Freq: Every day | ORAL | Status: DC
  Administered 2017-04-05 – 2017-04-07 (×3): 200 mg via ORAL
  Filled 2017-04-04 (×3): qty 2

## 2017-04-04 MED ORDER — SODIUM CHLORIDE 0.9 % IV SOLN
0.0000 ug/min | INTRAVENOUS | Status: DC
  Filled 2017-04-04 (×2): qty 2

## 2017-04-04 MED ORDER — MORPHINE SULFATE (PF) 2 MG/ML IV SOLN: 1.0000 mg | INTRAVENOUS | Status: DC | PRN

## 2017-04-04 MED ORDER — MAGNESIUM SULFATE 4 GM/100ML IV SOLN
4.0000 g | Freq: Once | INTRAVENOUS | Status: AC
  Administered 2017-04-04: 4 g via INTRAVENOUS
  Filled 2017-04-04: qty 100

## 2017-04-04 MED ORDER — ACETAMINOPHEN 500 MG PO TABS
1000.0000 mg | ORAL_TABLET | Freq: Four times a day (QID) | ORAL | Status: DC
  Administered 2017-04-05 – 2017-04-07 (×13): 1000 mg via ORAL
  Filled 2017-04-04 (×13): qty 2

## 2017-04-04 MED ORDER — HEPARIN SODIUM (PORCINE) 1000 UNIT/ML IJ SOLN
INTRAMUSCULAR | Status: AC
  Filled 2017-04-04: qty 1

## 2017-04-04 MED ORDER — ACETAMINOPHEN 650 MG RE SUPP
650.0000 mg | Freq: Once | RECTAL | Status: AC
  Administered 2017-04-04: 650 mg via RECTAL

## 2017-04-04 MED ORDER — FENTANYL CITRATE (PF) 250 MCG/5ML IJ SOLN
INTRAMUSCULAR | Status: AC
  Filled 2017-04-04: qty 30

## 2017-04-04 MED ORDER — MIDAZOLAM HCL 2 MG/2ML IJ SOLN: 2.0000 mg | INTRAMUSCULAR | Status: DC | PRN

## 2017-04-04 MED ORDER — SODIUM CHLORIDE 0.9% FLUSH: 10.0000 mL | INTRAVENOUS | Status: DC | PRN

## 2017-04-04 MED ORDER — MORPHINE SULFATE (PF) 4 MG/ML IV SOLN
1.0000 mg | INTRAVENOUS | Status: AC | PRN
  Administered 2017-04-04 – 2017-04-05 (×2): 2 mg via INTRAVENOUS
  Filled 2017-04-04 (×2): qty 1

## 2017-04-04 MED ORDER — FENTANYL CITRATE (PF) 250 MCG/5ML IJ SOLN
INTRAMUSCULAR | Status: DC | PRN
  Administered 2017-04-04 (×2): 50 ug via INTRAVENOUS
  Administered 2017-04-04: 100 ug via INTRAVENOUS
  Administered 2017-04-04: 50 ug via INTRAVENOUS
  Administered 2017-04-04 (×2): 100 ug via INTRAVENOUS
  Administered 2017-04-04 (×2): 50 ug via INTRAVENOUS
  Administered 2017-04-04: 250 ug via INTRAVENOUS
  Administered 2017-04-04 (×2): 100 ug via INTRAVENOUS

## 2017-04-04 SURGICAL SUPPLY — 95 items
ADAPTER CARDIO PERF ANTE/RETRO (ADAPTER) ×3 IMPLANT
APPLICATOR COTTON TIP 6IN STRL (MISCELLANEOUS) ×3 IMPLANT
ATRICLIP EXCLUSION 40 STD HAND (Clip) ×3 IMPLANT
BAG DECANTER FOR FLEXI CONT (MISCELLANEOUS) ×3 IMPLANT
BLADE 11 SAFETY STRL DISP (BLADE) ×3 IMPLANT
BLADE STERNUM SYSTEM 6 (BLADE) ×3 IMPLANT
BLADE SURG 15 STRL LF DISP TIS (BLADE) ×2 IMPLANT
BLADE SURG 15 STRL SS (BLADE) ×1
CANISTER SUCT 3000ML PPV (MISCELLANEOUS) ×3 IMPLANT
CANN PRFSN 3/8X14X24FR PCFC (MISCELLANEOUS) ×2
CANNULA GUNDRY RCSP 15FR (MISCELLANEOUS) ×3 IMPLANT
CANNULA PRFSN 3/8X14X24FR PCFC (MISCELLANEOUS) ×2 IMPLANT
CANNULA VEN MTL TIP RT (MISCELLANEOUS) ×1
CANNULA VRC MALB SNGL STG 34FR (MISCELLANEOUS) ×2 IMPLANT
CARDIOBLATE CARDIAC ABLATION (MISCELLANEOUS)
CATH ROBINSON RED A/P 18FR (CATHETERS) ×9 IMPLANT
CATH THORACIC 36FR (CATHETERS) ×3 IMPLANT
CATH THORACIC 36FR RT ANG (CATHETERS) ×3 IMPLANT
CLAMP ISOLATOR SYNERGY LG (MISCELLANEOUS) ×3 IMPLANT
CONN 1/2X1/2X1/2  BEN (MISCELLANEOUS) ×2
CONN 1/2X1/2X1/2 BEN (MISCELLANEOUS) ×4 IMPLANT
CONN 3/8X1/2 ST GISH (MISCELLANEOUS) ×6 IMPLANT
CONT SPEC 4OZ CLIKSEAL STRL BL (MISCELLANEOUS) IMPLANT
COVER SURGICAL LIGHT HANDLE (MISCELLANEOUS) ×6 IMPLANT
CRADLE DONUT ADULT HEAD (MISCELLANEOUS) ×3 IMPLANT
DEVICE CARDIOBLATE CARDIAC ABL (MISCELLANEOUS) IMPLANT
DRAPE CARDIOVASCULAR INCISE (DRAPES) ×1
DRAPE SLUSH/WARMER DISC (DRAPES) ×3 IMPLANT
DRAPE SRG 135X102X78XABS (DRAPES) ×2 IMPLANT
DRSG COVADERM 4X14 (GAUZE/BANDAGES/DRESSINGS) ×3 IMPLANT
ELECT CAUTERY BLADE 6.4 (BLADE) ×3 IMPLANT
ELECT REM PT RETURN 9FT ADLT (ELECTROSURGICAL) ×6
ELECTRODE REM PT RTRN 9FT ADLT (ELECTROSURGICAL) ×4 IMPLANT
FELT TEFLON 1X6 (MISCELLANEOUS) ×3 IMPLANT
GAUZE SPONGE 4X4 12PLY STRL (GAUZE/BANDAGES/DRESSINGS) ×6 IMPLANT
GAUZE SPONGE 4X4 12PLY STRL LF (GAUZE/BANDAGES/DRESSINGS) ×3 IMPLANT
GLOVE BIO SURGEON STRL SZ 6 (GLOVE) IMPLANT
GLOVE BIO SURGEON STRL SZ 6.5 (GLOVE) ×27 IMPLANT
GLOVE BIO SURGEON STRL SZ7 (GLOVE) IMPLANT
GLOVE BIO SURGEON STRL SZ7.5 (GLOVE) IMPLANT
GLOVE BIO SURGEON STRL SZ8.5 (GLOVE) ×6 IMPLANT
GLOVE BIOGEL PI IND STRL 8.5 (GLOVE) ×4 IMPLANT
GLOVE BIOGEL PI INDICATOR 8.5 (GLOVE) ×2
GLOVE EUDERMIC 7 POWDERFREE (GLOVE) ×6 IMPLANT
GOWN STRL REUS W/ TWL LRG LVL3 (GOWN DISPOSABLE) ×14 IMPLANT
GOWN STRL REUS W/ TWL XL LVL3 (GOWN DISPOSABLE) ×2 IMPLANT
GOWN STRL REUS W/TWL LRG LVL3 (GOWN DISPOSABLE) ×7
GOWN STRL REUS W/TWL XL LVL3 (GOWN DISPOSABLE) ×1
HEART VENT LT CURVED (MISCELLANEOUS) ×3 IMPLANT
HEMOSTAT POWDER SURGIFOAM 1G (HEMOSTASIS) ×9 IMPLANT
HEMOSTAT SURGICEL 2X14 (HEMOSTASIS) ×3 IMPLANT
KIT BASIN OR (CUSTOM PROCEDURE TRAY) ×3 IMPLANT
KIT CATH CPB BARTLE (MISCELLANEOUS) ×3 IMPLANT
KIT ROOM TURNOVER OR (KITS) ×3 IMPLANT
KIT SUCTION CATH 14FR (SUCTIONS) ×3 IMPLANT
LINE VENT (MISCELLANEOUS) ×3 IMPLANT
LOOP VESSEL SUPERMAXI WHITE (MISCELLANEOUS) ×6 IMPLANT
NDL SUT 1 .5 CRC FRENCH EYE (NEEDLE) ×2 IMPLANT
NEEDLE FRENCH EYE (NEEDLE) ×1
NS IRRIG 1000ML POUR BTL (IV SOLUTION) ×18 IMPLANT
PACK E OPEN HEART (SUTURE) ×3 IMPLANT
PACK OPEN HEART (CUSTOM PROCEDURE TRAY) ×3 IMPLANT
PAD ARMBOARD 7.5X6 YLW CONV (MISCELLANEOUS) ×6 IMPLANT
PROBE CRYO2-ABLATION MALLABLE (MISCELLANEOUS) ×3 IMPLANT
SET CARDIOPLEGIA MPS 5001102 (MISCELLANEOUS) ×3 IMPLANT
SUCKER WEIGHTED FLEX (MISCELLANEOUS) ×3 IMPLANT
SUT BONE WAX W31G (SUTURE) ×3 IMPLANT
SUT ETHIBON 2 0 V 52N 30 (SUTURE) ×21 IMPLANT
SUT ETHIBOND 2 0 SH (SUTURE) ×28 IMPLANT
SUT ETHIBOND 2 0 SH 36X2 (SUTURE) ×2 IMPLANT
SUT ETHIBOND 2 0 V4 (SUTURE) IMPLANT
SUT ETHIBOND 2 0V4 GREEN (SUTURE) IMPLANT
SUT ETHIBOND V-5 VALVE (SUTURE) ×3 IMPLANT
SUT PROLENE 3 0 SH 48 (SUTURE) ×12 IMPLANT
SUT PROLENE 3 0 SH DA (SUTURE) ×9 IMPLANT
SUT PROLENE 3 0 SH1 36 (SUTURE) ×3 IMPLANT
SUT PROLENE 4 0 RB 1 (SUTURE) ×8
SUT PROLENE 4-0 RB1 .5 CRCL 36 (SUTURE) ×16 IMPLANT
SUT SILK 2 0 SH CR/8 (SUTURE) ×3 IMPLANT
SUT STEEL 6MS V (SUTURE) ×6 IMPLANT
SUT VIC AB 1 CTX 36 (SUTURE) ×3
SUT VIC AB 1 CTX36XBRD ANBCTR (SUTURE) ×6 IMPLANT
SYSTEM SAHARA CHEST DRAIN ATS (WOUND CARE) ×3 IMPLANT
TAPE CLOTH SURG 4X10 WHT LF (GAUZE/BANDAGES/DRESSINGS) ×3 IMPLANT
TAPE PAPER 2X10 WHT MICROPORE (GAUZE/BANDAGES/DRESSINGS) ×3 IMPLANT
TOWEL GREEN STERILE (TOWEL DISPOSABLE) ×3 IMPLANT
TOWEL GREEN STERILE FF (TOWEL DISPOSABLE) ×3 IMPLANT
TRAY FOLEY SILVER 16FR TEMP (SET/KITS/TRAYS/PACK) ×3 IMPLANT
TUBE SUCT INTRACARD DLP 20F (MISCELLANEOUS) ×3 IMPLANT
UNDERPAD 30X30 (UNDERPADS AND DIAPERS) ×3 IMPLANT
VALVE AORTIC TOP HAT (Prosthesis & Implant Heart) ×1 IMPLANT
VALVE AORTIC TOP HAT 23 (Prosthesis & Implant Heart) ×2 IMPLANT
VALVE MITRAL 27MM (Prosthesis & Implant Heart) ×3 IMPLANT
VRC MALLEABLE SINGLE STG 34FR (MISCELLANEOUS) ×3
WATER STERILE IRR 1000ML POUR (IV SOLUTION) ×6 IMPLANT

## 2017-04-04 NOTE — Anesthesia Postprocedure Evaluation (Signed)
Anesthesia Post Note  Patient: Griselda MinerLinda Winiarski  Procedure(s) Performed: AORTIC VALVE REPLACEMENT (AVR) (N/A Chest) MITRAL VALVE (MV) REPLACEMENT (N/A Chest) MAZE (N/A Chest) TRANSESOPHAGEAL ECHOCARDIOGRAM (TEE) (N/A )     Patient location during evaluation: PACU Anesthesia Type: General Level of consciousness: sedated and patient remains intubated per anesthesia plan Pain management: pain level controlled Vital Signs Assessment: post-procedure vital signs reviewed and stable Respiratory status: patient remains intubated per anesthesia plan and patient on ventilator - see flowsheet for VS Cardiovascular status: stable Anesthetic complications: no    Last Vitals:  Vitals:   04/04/17 1645 04/04/17 1700  BP:  101/71  Pulse: 80 79  Resp: (!) 21 (!) 22  Temp: 36.6 C 36.6 C  SpO2: 95% 95%    Last Pain:  Vitals:   04/04/17 1500  TempSrc: Core  PainSc:                  Lewie LoronJohn Mihir Flanigan

## 2017-04-04 NOTE — Addendum Note (Signed)
Addendum  created 04/04/17 1803 by Lewie LoronGermeroth, Aeisha Minarik, MD   Diagnosis association updated

## 2017-04-04 NOTE — Op Note (Signed)
CARDIOVASCULAR SURGERY OPERATIVE NOTE  04/04/2017 Sharon MinerLinda Figueroa [id number]  Surgeon:  Alleen BorneBryan K. Dannielle Baskins, MD  First Assistant: Doree Fudgeonielle Zimmerman,  PA-C   Preoperative Diagnosis:  Severe  mitral stenosis, mild aortic stenosis, moderate aortic insufficiency, atrial fibrillation, moderate LV systolic dysfunction  Postoperative Diagnosis:  Same   Procedure:  1. Median Sternotomy 2. Extracorporeal circulation 3.   Aortic valve replacement using a 23 mm Carbomedics mechanical Supra-Annular Tophat valve. 4.   Mitral valve replacement using a 27 mm Carbomedics Optiform mechanical valve. 5.   MAZE IV bi-atrial lesion set with ligation of left atrial appendage  Anesthesia:  General Endotracheal   Clinical History/Surgical Indication:  The patient is a 57 year old woman with a history of a leaky mitral valve diagnosed over 30 years ago who reports having a murmur for many years and being followed in the past for mitral regurg[place] when [place].  She says that she has not had an echo for over 10 years and now presented with a 4-day history of worsening shortness of breath with exertion, orthopnea, PND, and chest heaviness with lying down in bed.  She does note development of progressive fatigue over the past several months although she has been able to remain fairly active with her daily routine.  Her initial troponin was 0.01.  Chest x-ray showed increased interstitial markings in the lungs consistent with pulmonary edema and small pleural effusions.  Electrocardiogram showed atrial flutter with a ventricular rate of 140. She improved with diuresis.  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  The valve area by continuity equation was 0.55 cm.  There was mild mitral regurgitation.  The aortic valve had moderate calcification with mild stenosis and moderate  regurgitation.  Left ventricular systolic function was mildly reduced with ejection fraction 45-50% with diffuse hypokinesis.  Left atrium severely dilated with severe pulmonary hypertension.  She underwent cardiac catheterization today showing no coronary disease.  There is moderate pulmonary hypertension with PA pressure of 56/28.  Right atrial pressure was 6.  Wedge pressure showed a mean of 29.  She has severe rheumatic mitral valve stenosis with a moderately calcified aortic valve with mild stenosis and moderate regurgitation and presented with [place] Heart Association class IV symptoms of exertional fatigue, shortness of breath at rest, orthopnea, and PND consistent with acute on chronic diastolic heart failure.  Her chest x-ray showed pulmonary edema on presentation and she has improved markedly with diuresis.  She had atrial flutter with a rapid ventricular response on presentation and is currently in with a controlled rate.  I have personally reviewed her echocardiogram and cardiac catheterization. Her echocardiogram shows severe thickening of the mitral valve with fusion of the leaflets and a mean gradient of 23 mmHg consistent with severe mitral stenosis.  Her aortic valve is trileaflet with moderately calcified leaflets with mild stenosis and a mean gradient of 10 mmHg.  The AI pressure half-time is 329 ms consistent with moderate aortic insufficiency.  I think the best treatment for this patient is replacement of the mitral and aortic valves with a Maze procedure.  She is almost 57 years old and I think mechanical valves would be the best option for her since she is having 2 valves replaced and may be on chronic anticoagulation due to atrial fibrillation/flutter.  She has no contraindications to anticoagulation. I discussed the operative procedure with the patient  including  alternatives, benefits and risks; including but not limited to bleeding, blood transfusion, infection, stroke, myocardial  infarction, recurrent or persistent atrial fibrillation or flutter,  heart block requiring a permanent pacemaker, organ dysfunction, and death.  I discussed the need for lifelong anticoagulation with mechanical valves.  I discussed the alternative of using tissue valves but I think she would still likely need to be on lifelong anticoagulation with 2 tissue valves and atrial fibrillation.  Sharon Figueroa understands and agrees to proceed.    Preparation:  The patient was seen in the preoperative holding area and the correct patient, correct operation were confirmed with the patient after reviewing the medical record and catheterization. The consent was signed by me. Preoperative antibiotics were given. A pulmonary arterial line and radial arterial line were placed by the anesthesia team. The patient was taken back to the operating room and positioned supine on the operating room table. After being placed under general endotracheal anesthesia by the anesthesia team a foley catheter was placed. The neck, chest, abdomen, and both legs were prepped with betadine soap and solution and draped in the usual sterile manner. A surgical time-out was taken and the correct patient and operative procedure were confirmed with the nursing and anesthesia staff.   Pre-bypass TEE:   Complete TEE assessment was performed by Dr. Lewie Loron. This showed a severely thickened and calcified mitral valve with a mean gradient in the 50's and a peak gradient of 78 mm Hg. There appeared to be thrombus in the tip of the LAA. The aortic valve was moderately calcified with mild AS and moderate AI. The LV was dilated and globally hypokinetic with an EF of 40%.     Post-bypass TEE:   Normal functioning prosthetic aortic and mitral valves with no perivalvular leak or regurgitation through the valve. Left ventricular function unchanged. The mean gradient across the mitral valve prosthesis was 2 mm Hg.     Cardiopulmonary  Bypass:  A median sternotomy was performed. The pericardium was opened in the midline. Right ventricular function appeared normal. The ascending aorta was of normal size and had no palpable plaque. There were no contraindications to aortic cannulation or cross-clamping. The patient was fully systemically heparinized and the ACT was maintained > 400 sec. The proximal aortic arch was cannulated with a 20 F aortic cannula for arterial inflow. Venous cannulation was performed via bi-caval venous cannulation using a 24 F metal tip right angle cannula in the SVC and a 34 F malleable plastic cannula in the IVC.  An antegrade cardioplegia/vent cannula was inserted into the mid-ascending aorta.  A retrograde cardioplegia cannnula was placed into the coronary sinus via the right atrium. Aortic occlusion was performed with a single cross-clamp. Systemic cooling to 28 degrees Centigrade and topical cooling of the heart with iced saline were used. Hyperkalemic antegrade cold blood cardioplegia was used to induce diastolic arrest and then cold blood retrograde cardioplegia was given at about 20 minute intervals throughout the period of arrest to maintain myocardial temperature at or below 10 degrees centigrade. A temperature probe was inserted into the interventricular septum and an insulating pad was placed in the pericardium. Carbon dioxide was insufflated into the pericardium at 5L/min throughout the procedure to minimize intracardiac air.  Cox Maze IV:   After achieving diastolic arrest the left pulmonary veins were circled with a tape. An Atricure bipolar RF clamp was used to create a lesion around the pulmonary veins on the left atrial wall.  The left atrium was  opened by a vertical incision in the interatrial groove. An encircling lesion was then created around the right pulmonary veins using the atriotomy incision anteriorly and a cryo lesion posteriorly. Then cryo lesions were created to join the pulmonary vein  lesions superiorly and inferiorly. All cryo lesions were for 2 minutes A cryo lesion was placed between the inferior lesion that joined the pulmonary veins and the posterior mitral annulus at P2-3. The cryo probe was then used to create a lesion joining the LAA and the left pulmonary veins.. A lesion was placed externally across the coronary sinus. The mitral valve repair was completed and then the right atrium was opened obliquely from the atrial septum posteriorly to the AV groove anteriorly. A cryo lesion was created to join the SVC and IVC posteriorly along the interatrial septum. A cryo lesion was placed from the anterior aspect of this oblique atriotomy down to the tricuspid annulus. Another cryo lesion was placed obliquely across the RAA beginning at the oblique atriotomy. The right atriotomy was then closed with two layers of 4-0 prolene suture.    Ligation of left atrial appendage:   The base of the appendage was measured and a 40 mm Atricure Atriclip was chosen. This was placed across the base of the LAA without difficulty.    Mitral Valve Replacement:   The left atrium was opened through a vertical incision in the interatrial groove. Exposure was good. Valve inspection showed markedly thickened leaflets with dense calcification of the posterior leaflet at the mitral annulus. There was calcification, thickening and shortening of the subvalvular apparatus. It was not possible to preserve any of the anterior or posterior leaflet.  A series of pledgetted 2-0 Ethibond sutures were placed around the mitral annulus. A 27 mm Carbomedics Optiform mechanical mitral valve ( SN [id number]) was chosen. The sutures were placed through the sewing ring and it was lowered into place. The sutures were tied.  The atrium was closed with 2 layers of continuous 3-0 prolene suture.   Aortic Valve Replacement:   A transverse aortotomy was performed 1 cm above the take-off of the right coronary artery. The native  valve was tricuspid with thickened and calcified leaflets and mild annular calcification. The ostia of the coronary arteries were in normal position and were not obstructed. The native valve leaflets were excised and the annulus was decalcified with rongeurs. Care was taken to remove all particulate debris. The left ventricle was directly inspected for debris and then irrigated with ice saline solution. The annulus was sized and a size 23 mm Carbomedics Supra-annular Top Hat valve was chosen. The  serial number was [id number]. While the valve was being prepared 2-0 Ethibond pledgeted horizontal mattress sutures were placed around the annulus with the pledgets in a sub-annular position. The sutures were placed through the sewing ring and the valve lowered into place. The sutures were tied sequentially. The valve seated nicely and the coronary ostia were not obstructed. The prosthetic valve leaflets moved normally and there was no sub-valvular obstruction. The coronary ostia were examined and were not obstructed. The aortotomy was closed using 4-0 Prolene suture in 2 layers with felt strips to reinforce the closure.  Completion:  The patient was rewarmed to 37 degrees Centigrade. De-airing maneuvers were performed and the head placed in trendelenburg position. The crossclamp was removed with a time of 228 minutes. There was spontaneous return of sinus rhythm. The aortotomy was checked for hemostasis. Two temporary epicardial pacing wires were placed on the right  atrium and two on the right ventricle. The  retrograde cardioplegia cannula was removed. The patient was weaned from CPB without difficulty on milrinone 0.25 mcg/kg/min. CPB time was 253 minutes. Cardiac output was 4 LPM. Heparin was fully reversed with protamine and the aortic and venous cannulas removed. Hemostasis was achieved. Mediastinal and left pleural drainage tubes were placed. The sternum was closed with  #6 stainless steel wires. The fascia was  closed with continuous # 1 vicryl suture. The subcutaneous tissue was closed with 2-0 vicryl continuous suture. The skin was closed with 3-0 vicryl subcuticular suture. All sponge, needle, and instrument counts were reported correct at the end of the case. Dry sterile dressings were placed over the incisions and around the chest tubes which were connected to pleurevac suction. The patient was then transported to the surgical intensive care unit in critical but stable condition.

## 2017-04-04 NOTE — Brief Op Note (Signed)
03/30/2017 - 04/04/2017  10:25 AM  PATIENT:  Sharon Figueroa  57 y.o. female  PRE-OPERATIVE DIAGNOSIS:  1. SEVERE RHEUMATIC MS 2. MODERATE RHEUMATIC  AI 3. ATRIAL FIBRILLATION  POST-OPERATIVE DIAGNOSIS: 1. SEVERE RHEUMATIC MS 2. MODERATE RHEUMATIC  AI 3. ATRIAL FIBRILLATION  PROCEDURE:  TRANSESOPHAGEAL ECHOCARDIOGRAM (TEE), MEDIAN STERNOTOMY for AORTIC VALVE REPLACEMENT (AVR) (using a [employer] ANNULAR TOP HAT MECHANICAL VALVE, serial # [id number], size 23), MITRAL VALVE (MV) REPLACEMENT (using a [employer] MECHANICAL VALVE, serial # [id number], size 27), BILATERAL BIPOLAR and COX CRYO MAZE   SURGEON:  Surgeon(s) and Role:    Alleen BorneBartle, Bryan K, MD - Primary  PHYSICIAN ASSISTANT: Doree Fudgeonielle Ruhan Borak PA-C  ASSISTANTS: Benay SpiceMarie Irwin RNFA  ANESTHESIA:   general  EBL:  350 mL   DRAINS: Chest tubes placed in the mediastinal and pleural spaces   SPECIMEN:  Source of Specimen:  Native AV leaflets and MV leaflets  DISPOSITION OF SPECIMEN:  PATHOLOGY  COUNTS CORRECT:  YES  DICTATION: .Dragon Dictation  PLAN OF CARE: Admit to inpatient   PATIENT DISPOSITION:  ICU - intubated and hemodynamically stable.   Delay start of Pharmacological VTE agent (>24hrs) due to surgical blood loss or risk of bleeding: yes  BASELINE WEIGHT: 57.7 kg

## 2017-04-04 NOTE — Progress Notes (Signed)
Patient ID: Sharon MinerLinda Figueroa, female   DOB: 09/27/1960, 57 y.o.   MRN: [id number] EVENING ROUNDS NOTE :     [address]       [place] [id number]             [phone]                 Day of Surgery Procedure(s) (LRB): AORTIC VALVE REPLACEMENT (AVR) (N/A) MITRAL VALVE (MV) REPLACEMENT (N/A) MAZE (N/A) TRANSESOPHAGEAL ECHOCARDIOGRAM (TEE) (N/A)  Total Length of Stay:  LOS: 5 days  BP (!) 85/68   Pulse 92   Temp 97.9 F (36.6 C)   Resp (!) 23   Ht 5\' 6"  (1.676 m)   Wt 127 lb 4.8 oz (57.7 kg)   SpO2 95%   BMI 20.55 kg/m   .Intake/Output      01/30 0701 - 01/31 0700 01/31 0701 - 02/01 0700   P.O. 1440    I.V. (mL/kg) 283.3 (4.9) 1920.4 (33.3)   Blood  1040   IV Piggyback  700   Total Intake(mL/kg) 1723.3 (29.9) 3660.4 (63.4)   Urine (mL/kg/hr) 1 (0) 1990 (3.1)   Stool     Blood  350   Chest Tube  340   Total Output 1 2680   Net +1722.3 +980.4        Urine Occurrence 3 x      . sodium chloride 20 mL/hr at 04/04/17 1800  . [START ON 04/05/2017] sodium chloride    . sodium chloride 20 mL/hr at 04/04/17 1800  . albumin human    . cefUROXime (ZINACEF)  IV Stopped (04/04/17 1705)  . dexmedetomidine (PRECEDEX) IV infusion Stopped (04/04/17 1705)  . famotidine (PEPCID) IV Stopped (04/04/17 1539)  . lactated ringers    . lactated ringers 20 mL/hr at 04/04/17 1800  . lactated ringers 20 mL/hr at 04/04/17 1800  . magnesium sulfate 4 g (04/04/17 1514)  . milrinone 0.25 mcg/kg/min (04/04/17 1800)  . nitroGLYCERIN Stopped (04/04/17 1500)  . phenylephrine (NEO-SYNEPHRINE) Adult infusion 15.067 mcg/min (04/04/17 1800)  . vancomycin       Lab Results  Component Value Date   WBC 11.1 (H) 04/04/2017   HGB 10.5 (L) 04/04/2017   HCT 31.0 (L) 04/04/2017   PLT 121 (L) 04/04/2017   GLUCOSE 81 04/04/2017   NA 143 04/04/2017   K 4.5 04/04/2017   CL 106 04/04/2017   CREATININE 0.60 04/04/2017   BUN 12 04/04/2017   CO2 24 04/04/2017   TSH 3.800 03/30/2017   INR 1.70 04/04/2017   HGBA1C 5.3 03/30/2017   BP 70's - giving volume Neuro intact  No bleeding  Wean to extubate when hemodynamically stable    [name] B Riese Hellard MD  Beeper [phone] Office [phone] 04/04/2017 6:14 PM

## 2017-04-04 NOTE — Procedures (Signed)
Extubation Procedure Note  Patient Details:   Name: Sharon Figueroa DOB: 11/15/1960 MRN: [id number]   Airway Documentation:     Evaluation  O2 sats: stable throughout Complications: No apparent complications Patient did tolerate procedure well. Bilateral Breath Sounds: Clear   Yes   NIF was -30, VC was 950ml with good pt effort.  ABG was within normal parameters as well.  PT extubated per SICU protocol.  RT will continue to monitor.  Ronny FlurryMorgan B Elman Dettman 04/04/2017, 6:49 PM

## 2017-04-04 NOTE — Anesthesia Procedure Notes (Signed)
Central Venous Catheter Insertion Performed by: Murvin Natal, MD, anesthesiologist Start/End1/31/2019 6:45 AM, 04/04/2017 7:00 AM Patient location: Pre-op. Preanesthetic checklist: patient identified, IV checked, site marked, risks and benefits discussed, surgical consent, monitors and equipment checked, pre-op evaluation, timeout performed and anesthesia consent Position: Trendelenburg Lidocaine 1% used for infiltration and patient sedated Hand hygiene performed  and maximum sterile barriers used  Catheter size: 9 Fr Total catheter length 11. PA cath was placed.MAC introducer Swan type:thermodilution PA Cath depth:52 Procedure performed using ultrasound guided technique. Ultrasound Notes:anatomy identified, needle tip was noted to be adjacent to the nerve/plexus identified and no ultrasound evidence of intravascular and/or intraneural injection Attempts: 1 Following insertion, line sutured, dressing applied and Biopatch. Post procedure assessment: blood return through all ports, free fluid flow and no air  Patient tolerated the procedure well with no immediate complications.

## 2017-04-04 NOTE — Progress Notes (Signed)
SICU wean protocol initiated.   

## 2017-04-04 NOTE — Anesthesia Procedure Notes (Signed)
Procedure Name: Intubation Date/Time: 04/04/2017 7:32 AM Performed by: Rosiland OzMeyers, Jontae Sonier, CRNA Pre-anesthesia Checklist: Patient identified, Emergency Drugs available, Suction available, Patient being monitored and Timeout performed Patient Re-evaluated:Patient Re-evaluated prior to induction Oxygen Delivery Method: Circle system utilized Preoxygenation: Pre-oxygenation with 100% oxygen Induction Type: IV induction Ventilation: Mask ventilation without difficulty Laryngoscope Size: Miller and 3 Grade View: Grade I Tube type: Oral Tube size: 7.5 mm Number of attempts: 1 Airway Equipment and Method: Stylet Placement Confirmation: ETT inserted through vocal cords under direct vision,  positive ETCO2 and breath sounds checked- equal and bilateral Secured at: 21 cm Tube secured with: Tape Dental Injury: Teeth and Oropharynx as per pre-operative assessment

## 2017-04-04 NOTE — Anesthesia Procedure Notes (Signed)
Arterial Line Insertion Start/End1/31/2019 6:40 AM, 04/04/2017 6:45 AM Performed by: Rosiland OzMeyers, Nelva Hauk, CRNA, CRNA  Preanesthetic checklist: patient identified, IV checked, site marked, risks and benefits discussed, surgical consent, monitors and equipment checked, pre-op evaluation, timeout performed and anesthesia consent Lidocaine 1% used for infiltration and patient sedated radial was placed Catheter size: 20 G Hand hygiene performed , maximum sterile barriers used  and Seldinger technique used  Attempts: 1 Procedure performed without using ultrasound guided technique. Following insertion, dressing applied. Post procedure assessment: normal  Patient tolerated the procedure well with no immediate complications.

## 2017-04-04 NOTE — Progress Notes (Signed)
  Echocardiogram Echocardiogram Transesophageal has been performed.  Sharon Figueroa Sharon Figueroa 04/04/2017, 9:43 AM

## 2017-04-04 NOTE — Transfer of Care (Signed)
Immediate Anesthesia Transfer of Care Note  Patient: Sharon Figueroa  Procedure(s) Performed: AORTIC VALVE REPLACEMENT (AVR) (N/A Chest) MITRAL VALVE (MV) REPLACEMENT (N/A Chest) MAZE (N/A Chest) TRANSESOPHAGEAL ECHOCARDIOGRAM (TEE) (N/A )  Patient Location: SICU  Anesthesia Type:General  Level of Consciousness: sedated, patient cooperative and Patient remains intubated per anesthesia plan  Airway & Oxygen Therapy: Patient Spontanous Breathing, Patient remains intubated per anesthesia plan and Patient placed on Ventilator (see vital sign flow sheet for setting)  Post-op Assessment: Report given to RN and Post -op Vital signs reviewed and stable  Post vital signs: Reviewed and stable  Last Vitals:  Vitals:   04/04/17 0524 04/04/17 1500  BP: 121/66 120/70  Pulse: 97 87  Resp: 18   Temp: 36.6 C   SpO2: 100% 100%    Last Pain:  Vitals:   04/03/17 1344  TempSrc: Oral  PainSc:       Patients Stated Pain Goal: 0 (04/03/17 0845)  Complications: No apparent anesthesia complications

## 2017-04-05 ENCOUNTER — Encounter (HOSPITAL_COMMUNITY): Payer: Self-pay | Admitting: Surgery

## 2017-04-05 ENCOUNTER — Inpatient Hospital Stay (HOSPITAL_COMMUNITY): Payer: Managed Care, Other (non HMO)

## 2017-04-05 DIAGNOSIS — I480 Paroxysmal atrial fibrillation: Principal | ICD-10-CM

## 2017-04-05 DIAGNOSIS — Z952 Presence of prosthetic heart valve: Secondary | ICD-10-CM

## 2017-04-05 LAB — POCT I-STAT, CHEM 8
BUN: 13 mg/dL (ref 6–20)
Calcium, Ion: 1.15 mmol/L (ref 1.15–1.40)
Chloride: 100 mmol/L — ABNORMAL LOW (ref 101–111)
Creatinine, Ser: 0.7 mg/dL (ref 0.44–1.00)
Glucose, Bld: 138 mg/dL — ABNORMAL HIGH (ref 65–99)
HCT: 26 % — ABNORMAL LOW (ref 36.0–46.0)
Hemoglobin: 8.8 g/dL — ABNORMAL LOW (ref 12.0–15.0)
Potassium: 4.4 mmol/L (ref 3.5–5.1)
Sodium: 132 mmol/L — ABNORMAL LOW (ref 135–145)
TCO2: 21 mmol/L — ABNORMAL LOW (ref 22–32)

## 2017-04-05 LAB — GLUCOSE, CAPILLARY
Glucose-Capillary: 105 mg/dL — ABNORMAL HIGH (ref 65–99)
Glucose-Capillary: 106 mg/dL — ABNORMAL HIGH (ref 65–99)
Glucose-Capillary: 115 mg/dL — ABNORMAL HIGH (ref 65–99)
Glucose-Capillary: 122 mg/dL — ABNORMAL HIGH (ref 65–99)
Glucose-Capillary: 139 mg/dL — ABNORMAL HIGH (ref 65–99)
Glucose-Capillary: 143 mg/dL — ABNORMAL HIGH (ref 65–99)
Glucose-Capillary: 82 mg/dL (ref 65–99)
Glucose-Capillary: 99 mg/dL (ref 65–99)

## 2017-04-05 LAB — BASIC METABOLIC PANEL
Anion gap: 5 (ref 5–15)
BUN: 12 mg/dL (ref 6–20)
CO2: 21 mmol/L — ABNORMAL LOW (ref 22–32)
Calcium: 7.8 mg/dL — ABNORMAL LOW (ref 8.9–10.3)
Chloride: 110 mmol/L (ref 101–111)
Creatinine, Ser: 0.72 mg/dL (ref 0.44–1.00)
GFR calc Af Amer: >60 mL/min (ref >60)
GFR calc non Af Amer: >60 mL/min (ref >60)
Glucose, Bld: 119 mg/dL — ABNORMAL HIGH (ref 65–99)
Potassium: 4.3 mmol/L (ref 3.5–5.1)
Sodium: 136 mmol/L (ref 135–145)

## 2017-04-05 LAB — PREPARE PLATELET PHERESIS: Unit division: 0

## 2017-04-05 LAB — CBC
HCT: 25.4 % — ABNORMAL LOW (ref 36.0–46.0)
Hemoglobin: 8.6 g/dL — ABNORMAL LOW (ref 12.0–15.0)
MCH: 30.8 pg (ref 26.0–34.0)
MCHC: 33.9 g/dL (ref 30.0–36.0)
MCV: 91 fL (ref 78.0–100.0)
Platelets: 120 10*3/uL — ABNORMAL LOW (ref 150–400)
RBC: 2.79 MIL/uL — ABNORMAL LOW (ref 3.87–5.11)
RDW: 14.9 % (ref 11.5–15.5)
WBC: 12.4 10*3/uL — ABNORMAL HIGH (ref 4.0–10.5)

## 2017-04-05 LAB — BPAM PLATELET PHERESIS
Blood Product Expiration Date: 201902022359
ISSUE DATE / TIME: 201901311322
Unit Type and Rh: 6200

## 2017-04-05 LAB — MAGNESIUM: Magnesium: 2.8 mg/dL — ABNORMAL HIGH (ref 1.7–2.4)

## 2017-04-05 LAB — HEMOGLOBIN AND HEMATOCRIT, BLOOD
HCT: 22.6 % — ABNORMAL LOW (ref 36.0–46.0)
Hemoglobin: 7.7 g/dL — ABNORMAL LOW (ref 12.0–15.0)

## 2017-04-05 MED ORDER — ASPIRIN EC 81 MG PO TBEC
81.0000 mg | DELAYED_RELEASE_TABLET | Freq: Every day | ORAL | Status: DC
  Administered 2017-04-05 – 2017-04-07 (×3): 81 mg via ORAL
  Filled 2017-04-05 (×3): qty 1

## 2017-04-05 MED ORDER — WARFARIN - PHYSICIAN DOSING INPATIENT
Freq: Every day | Status: DC
  Administered 2017-04-05: 1
  Administered 2017-04-06 – 2017-04-08 (×3)

## 2017-04-05 MED ORDER — WARFARIN SODIUM 2.5 MG PO TABS
2.5000 mg | ORAL_TABLET | Freq: Once | ORAL | Status: AC
  Administered 2017-04-05: 2.5 mg via ORAL
  Filled 2017-04-05: qty 1

## 2017-04-05 MED ORDER — KETOROLAC TROMETHAMINE 15 MG/ML IJ SOLN
15.0000 mg | Freq: Four times a day (QID) | INTRAMUSCULAR | Status: DC | PRN
  Administered 2017-04-05 – 2017-04-06 (×3): 15 mg via INTRAVENOUS
  Filled 2017-04-05 (×3): qty 1

## 2017-04-05 MED ORDER — ASPIRIN 81 MG PO CHEW: 81.0000 mg | CHEWABLE_TABLET | Freq: Every day | ORAL | Status: DC

## 2017-04-05 MED ORDER — TRAMADOL HCL 50 MG PO TABS
50.0000 mg | ORAL_TABLET | ORAL | Status: DC | PRN
  Administered 2017-04-06 – 2017-04-07 (×2): 50 mg via ORAL
  Filled 2017-04-05 (×2): qty 1

## 2017-04-05 MED ORDER — ENOXAPARIN SODIUM 40 MG/0.4ML ~~LOC~~ SOLN
40.0000 mg | Freq: Every day | SUBCUTANEOUS | Status: DC
  Administered 2017-04-05 – 2017-04-07 (×3): 40 mg via SUBCUTANEOUS
  Filled 2017-04-05 (×3): qty 0.4

## 2017-04-05 MED ORDER — FUROSEMIDE 10 MG/ML IJ SOLN
40.0000 mg | Freq: Two times a day (BID) | INTRAMUSCULAR | Status: AC
  Administered 2017-04-05 (×2): 40 mg via INTRAVENOUS
  Filled 2017-04-05 (×2): qty 4

## 2017-04-05 MED ORDER — POTASSIUM CHLORIDE CRYS ER 20 MEQ PO TBCR
20.0000 meq | EXTENDED_RELEASE_TABLET | Freq: Two times a day (BID) | ORAL | Status: DC
  Administered 2017-04-05 (×2): 20 meq via ORAL
  Filled 2017-04-05 (×3): qty 1

## 2017-04-05 MED FILL — Heparin Sodium (Porcine) Inj 1000 Unit/ML: INTRAMUSCULAR | Qty: 30 | Status: AC

## 2017-04-05 MED FILL — Magnesium Sulfate Inj 50%: INTRAMUSCULAR | Qty: 10 | Status: AC

## 2017-04-05 MED FILL — Potassium Chloride Inj 2 mEq/ML: INTRAVENOUS | Qty: 40 | Status: AC

## 2017-04-05 MED FILL — Thrombin (Recombinant) For Soln 5000 Unit: CUTANEOUS | Qty: 3 | Status: AC

## 2017-04-05 NOTE — Progress Notes (Signed)
Patient ID: Griselda MinerLinda Slifer, female   DOB: 12/07/1960, 57 y.o.   MRN: [id number] [facility] Evening Rounds:  Hemodynamically stable today off milrinone  Diuresing well  Up ambulating for the first time.  Pm labs pending

## 2017-04-05 NOTE — Addendum Note (Signed)
Addendum  created 04/05/17 0720 by Lewie LoronGermeroth, Ettel Albergo, MD   Diagnosis association updated

## 2017-04-05 NOTE — Progress Notes (Signed)
Progress Note  Patient Name: Sharon Figueroa Date of Encounter: 04/05/2017  Primary Cardiologist: Kirk Ruths, MD   Subjective   No dyspnea; chest "sore"  Inpatient Medications    Scheduled Meds: . acetaminophen  1,000 mg Oral Q6H   Or  . acetaminophen (TYLENOL) oral liquid 160 mg/5 mL  1,000 mg Per Tube Q6H  . aspirin EC  325 mg Oral Daily   Or  . aspirin  324 mg Per Tube Daily  . bisacodyl  10 mg Oral Daily   Or  . bisacodyl  10 mg Rectal Daily  . chlorhexidine gluconate (MEDLINE KIT)  15 mL Mouth Rinse BID  . Chlorhexidine Gluconate Cloth  6 each Topical Daily  . docusate sodium  200 mg Oral Daily  . insulin aspart  0-24 Units Subcutaneous Q4H  . mouth rinse  15 mL Mouth Rinse QID  . metoprolol tartrate  12.5 mg Oral BID   Or  . metoprolol tartrate  12.5 mg Per Tube BID  . [START ON 04/06/2017] pantoprazole  40 mg Oral Daily  . sodium chloride flush  10-40 mL Intracatheter Q12H  . sodium chloride flush  3 mL Intravenous Q12H   Continuous Infusions: . sodium chloride 20 mL/hr at 04/04/17 2225  . sodium chloride    . sodium chloride 20 mL/hr at 04/04/17 1900  . albumin human    . cefUROXime (ZINACEF)  IV 1.5 g (04/05/17 0529)  . dexmedetomidine (PRECEDEX) IV infusion Stopped (04/04/17 1705)  . famotidine (PEPCID) IV Stopped (04/04/17 1539)  . lactated ringers 20 mL/hr at 04/04/17 1900  . lactated ringers 20 mL/hr at 04/04/17 1900  . milrinone 0.25 mcg/kg/min (04/05/17 0528)  . nitroGLYCERIN Stopped (04/04/17 1500)  . phenylephrine (NEO-SYNEPHRINE) Adult infusion Stopped (04/05/17 0529)   PRN Meds: sodium chloride, albumin human, metoprolol tartrate, midazolam, morphine injection, ondansetron (ZOFRAN) IV, oxyCODONE, sodium chloride flush, sodium chloride flush, traMADol   Vital Signs    Vitals:   04/05/17 0437 04/05/17 0500 04/05/17 0600 04/05/17 0620  BP:  101/66 101/68   Pulse: 89 88 87 89  Resp: _0 Temp: (!) 97.5 F (36.4 C) (!) 97.3 F (36.3  C) (!) 97.2 F (36.2 C) (!) 96.8 F (36 C)  TempSrc:      SpO2: 93% 94% 98% 90%  Weight:    140 lb 1.6 oz (63.5 kg)  Height:        Intake/Output Summary (Last 24 hours) at 04/05/2017 0736 Last data filed at 04/05/2017 0600 Gross per 24 hour  Intake 5918.48 ml  Output 3650 ml  Net 2268.48 ml   Filed Weights   04/03/17 0459 04/04/17 0524 04/05/17 0620  Weight: 127 lb 11.2 oz (57.9 kg) 127 lb 4.8 oz (57.7 kg) 140 lb 1.6 oz (63.5 kg)    Telemetry    A paced; Personally Reviewed   Physical Exam   GEN: WD WN NAD Neck: supple Cardiac: RRR, crisp mechanical valve sounds Respiratory: CTA anteriorly GI: NT, ND, No masses MS: No edema Neuro:  Grossly intact   Labs    Chemistry Recent Labs  Lab 04/03/17 0418 04/04/17 0525  04/04/17 1347 04/04/17 1506 04/04/17 2007 04/04/17 2014 04/05/17 0418  NA 138 137   < > 139 143  --  139 136  K 4.2 4.2   < > 5.3* 4.5  --  4.7 4.3  CL 107 103   < > 106  --   --  107 110  CO2 24  24  --   --   --   --   --  21*  GLUCOSE 99 99   < > 159* 81  --  145* 119*  BUN 12 10   < > 12  --   --  11 12  CREATININE 0.90 0.90   < > 0.60  --  0.75 0.60 0.72  CALCIUM 8.8* 9.2  --   --   --   --   --  7.8*  GFRNONAA >60 >60  --   --   --  >60  --  >60  GFRAA >60 >60  --   --   --  >60  --  >60  ANIONGAP 7 10  --   --   --   --   --  5   < > = values in this interval not displayed.     Hematology Recent Labs  Lab 04/04/17 1454  04/04/17 2007 04/04/17 2014 04/05/17 0418  WBC 11.1*  --  14.3*  --  12.4*  RBC 3.49*  --  3.11*  --  2.79*  HGB 10.9*   < > 9.4* 8.5* 8.6*  HCT 32.2*   < > 28.3* 25.0* 25.4*  MCV 92.3  --  91.0  --  91.0  MCH 31.2  --  30.2  --  30.8  MCHC 33.9  --  33.2  --  33.9  RDW 14.6  --  14.4  --  14.9  PLT 121*  --  121*  --  120*   < > = values in this interval not displayed.     Recent Labs  Lab 03/30/17 1216  [place] 0.01      Patient Profile     57 y.o. female with past medical history of mitral  regurgitation admitted with new onset atrial fibrillation and congestive heart failure. Echo showed EF 45-50, rheumatic MV with severe MS, mild MR, mild AS, moderate AI. Had AVR/MVR/Maze1/31.  Assessment & Plan    1. New-onset atrial fibrillation-atrial paced rhythm this AM; s/p Maze; will be on coumadin for AVR/MVR.  2. Valvular heart disease-s/p AVR/MVR; will arrange outpt baseline echo in 3 months; will need coumadin with goal INR 2.5-3.5 and ASA 81 mg daily chronically. 3. Expected postop blood loss anemia-follow hgb 4. Acute diastolic CHF-improved prior to OR; should remain stable following AVR/MVR and if pt holds sinus.  For questions or updates, please contact [place] Please consult [email or web address] for contact info under Cardiology/STEMI.      Signed, Kirk Ruths, MD  04/05/2017, 7:36 AM

## 2017-04-05 NOTE — Progress Notes (Signed)
1 Day Post-Op Procedure(s) (LRB): AORTIC VALVE REPLACEMENT (AVR) (N/A) MITRAL VALVE (MV) REPLACEMENT (N/A) MAZE (N/A) TRANSESOPHAGEAL ECHOCARDIOGRAM (TEE) (N/A) Subjective: Incisional soreness, otherwise feels ok  Objective: Vital signs in last 24 hours: Temp:  [96.8 F (36 C)-98.1 F (36.7 C)] 97 F (36.1 C) (02/01 0750) Pulse Rate:  [28-93] 89 (02/01 0620) Cardiac Rhythm: Atrial paced (02/01 0400) Resp:  [5-27] 13 (02/01 0620) BP: (59-120)/(47-86) 101/68 (02/01 0600) SpO2:  [90 %-100 %] 90 % (02/01 0620) Arterial Line BP: (80-128)/(34-71) 109/60 (02/01 0620) FiO2 (%):  [40 %-50 %] 40 % (01/31 1809) Weight:  [63.5 kg (140 lb 1.6 oz)] 63.5 kg (140 lb 1.6 oz) (02/01 0620)  Hemodynamic parameters for last 24 hours: PAP: (23-37)/(10-18) 30/16 CO:  [3.1 L/min-5 L/min] 5 L/min CI:  [1.9 L/min/m2-3 L/min/m2] 3 L/min/m2  Intake/Output from previous day: 01/31 0701 - 02/01 0700 In: 5918.5 [P.O.:480; I.V.:3148.5; Blood:1040; IV Piggyback:1250] Out: 3650 [Urine:2680; Blood:350; Chest Tube:620] Intake/Output this shift: No intake/output data recorded.  General appearance: alert and cooperative Neurologic: intact Heart: regular rate and rhythm and crisp mechanical valve click, no murmur Lungs: clear to auscultation bilaterally Extremities: edema mild Wound: dressing dry  Lab Results: Recent Labs    04/04/17 2007 04/04/17 2014 04/05/17 0418  WBC 14.3*  --  12.4*  HGB 9.4* 8.5* 8.6*  HCT 28.3* 25.0* 25.4*  PLT 121*  --  120*   BMET:  Recent Labs    04/04/17 0525  04/04/17 2014 04/05/17 0418  NA 137   < > 139 136  K 4.2   < > 4.7 4.3  CL 103   < > 107 110  CO2 24  --   --  21*  GLUCOSE 99   < > 145* 119*  BUN 10   < > 11 12  CREATININE 0.90   < > 0.60 0.72  CALCIUM 9.2  --   --  7.8*   < > = values in this interval not displayed.    PT/INR:  Recent Labs    04/04/17 1454  LABPROT 19.8*  INR 1.70   ABG    Component Value Date/Time   PHART 7.375 04/04/2017  2009   HCO3 18.7 (L) 04/04/2017 2009   TCO2 20 (L) 04/04/2017 2014   ACIDBASEDEF 6.0 (H) 04/04/2017 2009   O2SAT 89.0 04/04/2017 2009   CBG (last 3)  Recent Labs    04/04/17 2009 04/05/17 0012 04/05/17 0427  GLUCAP 143* 139* 122*   CXR: bibasilar atelectasis  ECG: sinus 60  Assessment/Plan: S/P Procedure(s) (LRB): AORTIC VALVE REPLACEMENT (AVR) (N/A) MITRAL VALVE (MV) REPLACEMENT (N/A) MAZE (N/A) TRANSESOPHAGEAL ECHOCARDIOGRAM (TEE) (N/A)  POD 1 Hemodynamically stable in sinus rhythm on milrinone 0.25. CI is 3 so will wean off milrinone. Preop EF 40% with dilated LV and global hypokinesis probably due to long-standing AI. Continue pacing at 90 for now and once of milrinone will turn down pacer. Hold beta blocker for now. She was never started on amiodarone preop for atrial fib so will hold off unless she goes back into it. Hopefully MAZE will maintain sinus. Plan to start ACE I later as BP allows.  Volume excess postop as expected: wt is 13 lbs over preop. Diurese.  Start Coumadin slowly tonight  DC chest tubes, swan and arterial line.  IS, ambulation.   LOS: 6 days    Alleen BorneBryan K Coleby Yett 04/05/2017

## 2017-04-05 NOTE — Discharge Summary (Signed)
Physician Discharge Summary       [address] [place]       [place] [address]             [phone]    Patient ID: Sharon Figueroa MRN: [id number] DOB/AGE: [birth year] 57 y.o.  Admit date: 03/30/2017 Discharge date: 04/11/2017  Admission Diagnoses: 1. Rheumatic heart disease 2. Severe mitral valve stenosis 3. Moderate aortic insufficiency 4. Atrial fibrillation (HCC)  Active Diagnoses:  1. S/P MVR (mitral valve replacement) 2. S/P aortic valve replacement 3. S/p bilateral Maze 4. ABL anemia 5. Mild thrombocytopenia     Procedure (s):  1. Median Sternotomy 2. Extracorporeal circulation 3.   Aortic valve replacement using a 23 mm [employer] mechanical Supra-Annular [place] valve. 4.   Mitral valve replacement using a 27 mm [employer] Optiform mechanical valve. 5.   MAZE IV bi-atrial lesion set with ligation of left atrial appendage by Dr. Laneta Figueroa on 04/04/2017.   History of Presenting Illness: The patient is a 57 year old woman with a history of a leaky mitral valve diagnosed over 30 years ago who reports having a murmur for many years and being followed in the past for mitral regurgitation when she lived in [place].  She says that she has not had an echo for over 10 years and now presented with a 4-day history of worsening shortness of breath with exertion, orthopnea, PND, and chest heaviness with lying down in bed.  She does note development of progressive fatigue over the past several months although she has been able to remain fairly active with her daily routine.  Her initial troponin was 0.01.  Chest x-ray showed increased interstitial markings in the lungs consistent with pulmonary edema and small pleural effusions.  Electrocardiogram showed atrial flutter with a ventricular rate of 140. She improved with diuresis.  An echocardiogram yesterday showed severe thickening of the mitral valve consistent with rheumatic disease with severe stenosis and a pressure  half-time of 0.81 cm.  The valve area by continuity equation was 0.55 cm.  There was mild mitral regurgitation.  The aortic valve had moderate calcification with mild stenosis and moderate regurgitation.  Left ventricular systolic function was mildly reduced with ejection fraction 45-50% with diffuse hypokinesis.  Left atrium severely dilated with severe pulmonary hypertension.  She underwent cardiac catheterization today showing no coronary disease.  There is moderate pulmonary hypertension with PA pressure of 56/28.  Right atrial pressure was 6.  Wedge pressure showed a mean of 29.  Dr. Laneta Figueroa thought the best treatment for this patient is replacement of the mitral and aortic valves with a Maze procedure.  She is almost 57 years old and he thought mechanical valves would be the best option for her since she is having 2 valves replaced and may be on chronic anticoagulation due to atrial fibrillation/flutter.  She has no contraindications to anticoagulation. Dr. Laneta Figueroa discussed the operative procedure with the patient  including alternatives, benefits and risks; including but not limited to bleeding, blood transfusion, infection, stroke, myocardial infarction, recurrent or persistent atrial fibrillation or flutter,  heart block requiring a permanent pacemaker, organ dysfunction, and death.  he discussed the need for lifelong anticoagulation with mechanical valves.  Dr. Laneta Figueroa discussed the alternative of using tissue valves but I think she would still likely need to be on lifelong anticoagulation with 2 tissue valves and atrial fibrillation.  Sharon Figueroa understands and agrees to proceed. Pre operative carotid duplex US showed no significant internal carotid artery stenosis bilaterally. She underwent  aortic and mitral valve replacements and MAZE on 04/04/2017.   Brief Hospital Course:  The patient was extubated the evening of surgery without difficulty. She remained afebrile and hemodynamically stable.  She was weaned off Milrinone drip. Sharon Figueroa, a line, chest tubes, and foley were removed early in the post operative course. Lopressor was started and titrated accordingly. She was volume over loaded and diuresed. She had ABL anemia. She did not require a post op transfusion. Last H and H was 8.5 and 25.2 . She was started on Coumadin for her mechanical valves. PT and INR were monitored daily. She has been given 2 doses of 7.5 mg of Coumadin but previous to those was on 5 mg. Her latest PT and INR are 24 and 2.17. As discussed with Dr. Laneta Figueroa, she will be discharged on 5 mg of Coumadin.  She was weaned off the insulin drip.  Once she was tolerating a diet, home diabetic medicines were restarted.  The patient's glucose remained well controlled.The patient's HGA1C pre op was 5.3. The patient was felt surgically stable for transfer from the ICU to PCTU for further convalescence on 04/08/2016. She continues to progress with cardiac rehab. She was ambulating on room air. She has been tolerating a diet and has had a bowel movement. Epicardial pacing wires have already been removed. Chest tube sutures will be removed in the office after discharge. The patient is felt surgically stable for discharge today.   Latest Vital Signs: Blood pressure 113/63, pulse 80, temperature 98.7 F (37.1 C), temperature source Oral, resp. rate 20, height 5\' 6"  (1.676 m), weight 122 lb 1.6 oz (55.4 kg), SpO2 95 %.  Physical Exam: Cardiovascular: RRR, no murmur, sharp valve click x 2 Pulmonary: Clear to auscultation bilaterally Abdomen: Soft, non tender, bowel sounds present. Extremities: No lower extremity edema. Wounds: Clean and dry.  No erythema or signs of infection.  Discharge Condition:Stable and discharged to home.  Recent laboratory studies:  Lab Results  Component Value Date   WBC 10.7 (H) 04/07/2017   HGB 8.5 (L) 04/07/2017   HCT 25.2 (L) 04/07/2017   MCV 92.3 04/07/2017   PLT 133 (L) 04/07/2017   Lab  Results  Component Value Date   NA 133 (L) 04/07/2017   K 3.9 04/07/2017   CL 98 (L) 04/07/2017   CO2 26 04/07/2017   CREATININE 0.77 04/07/2017   GLUCOSE 112 (H) 04/07/2017    Diagnostic Studies: Dg Chest 2 View  Result Date: 04/08/2017 CLINICAL DATA:  Recent cardiac valve replacement EXAM: CHEST  2 VIEW COMPARISON:  04/06/2017 FINDINGS: Cardiac shadow is stable. Postoperative changes are again identified. The lungs are well aerated bilaterally. Small bilateral pleural effusions are again noted with bibasilar atelectasis. No pneumothorax is noted. No bony abnormality is seen. IMPRESSION: Stable bibasilar changes. Electronically Signed   By: Alcide Clever M.D.   On: 04/08/2017 07:10    Discharge Instructions    Amb Referral to Cardiac Rehabilitation   Complete by:  As directed    Diagnosis:  Valve Replacement   Valve:   Mitral Aortic       Discharge Medications: Allergies as of 04/11/2017   No Known Allergies     Medication List    TAKE these medications   acetaminophen 325 MG tablet Commonly known as:  TYLENOL Take 2 tablets (650 mg total) by mouth every 6 (six) hours as needed for mild pain.   ferrous sulfate 325 (65 FE) MG tablet Take 1 tablet (325 mg total)  by mouth daily. For one month then stop.   metoprolol tartrate 25 MG tablet Commonly known as:  LOPRESSOR Take 0.5 tablets (12.5 mg total) by mouth 2 (two) times daily.   traMADol 50 MG tablet Commonly known as:  ULTRAM Take 1 tablet (50 mg total) by mouth every 4 (four) hours as needed for moderate pain.   warfarin 5 MG tablet Commonly known as:  COUMADIN Take 1 tablet (5 mg total) by mouth daily at 6 PM. Or as directed.      The patient has been discharged on:   1.Beta Blocker:  Yes [  x ]                              No   [   ]                              If No, reason:  2.Ace Inhibitor/ARB: Yes [   ]                                     No  [  x  ]                                     If No,  reason:Labile BP  3.Statin:   Yes [   ]                  No  [  x ]                  If No, reason:No CAD  4.Marlowe KaysEcasa:  Yes  [   ]                  No   [  x ]                  If No, reason:No CAD  Follow Up Appointments: Follow-up Information    [facility]. Go on 04/12/2017.   Specialty:  Cardiology Why:  Appointment is to have PT and INR drawn (on Coumadin for mechanical aortic and mitral valves, INR goal 3-3.5). Appointment time is at 10:30 am Contact information: [address], [address] [place] [id number] [phone]       Alleen BorneBartle, Bryan K, MD. Go on 05/08/2017.   Specialty:  Cardiothoracic Surgery Why:  PA/LAT CXR [facility] be taken ([facility] which is in the same building as Dr. Sharee PimpleBartle's office) on 05/08/2017 at 9:30 am;Appointment time is at 10:00 am Contact information: [address] [place] [address] [phone]        Nurse. Go on 04/16/2017.   Why:  Appointmen is with nurse only to have chest tube sutures removed. Appointment time is at 10:00 am Contact information: [address] [place] [address]       Jodelle GrossLawrence, Kathryn M, NP. Go on 04/29/2017.   Specialties:  Nurse Practitioner, Radiology, Cardiology Why:  Appointment time is at 9:00 am Contact information: [address] [place] [address] [phone]           Signed: Lelon HuhDonielle M [facility] 04/11/2017, 7:18 AM

## 2017-04-06 ENCOUNTER — Inpatient Hospital Stay (HOSPITAL_COMMUNITY): Payer: Managed Care, Other (non HMO)

## 2017-04-06 LAB — CBC
HCT: 24.6 % — ABNORMAL LOW (ref 36.0–46.0)
Hemoglobin: 8.3 g/dL — ABNORMAL LOW (ref 12.0–15.0)
MCH: 31.1 pg (ref 26.0–34.0)
MCHC: 33.7 g/dL (ref 30.0–36.0)
MCV: 92.1 fL (ref 78.0–100.0)
Platelets: 94 10*3/uL — ABNORMAL LOW (ref 150–400)
RBC: 2.67 MIL/uL — ABNORMAL LOW (ref 3.87–5.11)
RDW: 15 % (ref 11.5–15.5)
WBC: 11.3 10*3/uL — ABNORMAL HIGH (ref 4.0–10.5)

## 2017-04-06 LAB — BASIC METABOLIC PANEL
Anion gap: 9 (ref 5–15)
BUN: 15 mg/dL (ref 6–20)
CO2: 21 mmol/L — ABNORMAL LOW (ref 22–32)
Calcium: 8.2 mg/dL — ABNORMAL LOW (ref 8.9–10.3)
Chloride: 100 mmol/L — ABNORMAL LOW (ref 101–111)
Creatinine, Ser: 0.86 mg/dL (ref 0.44–1.00)
GFR calc Af Amer: >60 mL/min (ref >60)
GFR calc non Af Amer: >60 mL/min (ref >60)
Glucose, Bld: 112 mg/dL — ABNORMAL HIGH (ref 65–99)
Potassium: 4.5 mmol/L (ref 3.5–5.1)
Sodium: 130 mmol/L — ABNORMAL LOW (ref 135–145)

## 2017-04-06 LAB — POCT I-STAT, CHEM 8
BUN: 14 mg/dL (ref 6–20)
Calcium, Ion: 1.12 mmol/L — ABNORMAL LOW (ref 1.15–1.40)
Chloride: 96 mmol/L — ABNORMAL LOW (ref 101–111)
Creatinine, Ser: 0.8 mg/dL (ref 0.44–1.00)
Glucose, Bld: 111 mg/dL — ABNORMAL HIGH (ref 65–99)
HCT: 22 % — ABNORMAL LOW (ref 36.0–46.0)
Hemoglobin: 7.5 g/dL — ABNORMAL LOW (ref 12.0–15.0)
Potassium: 3.6 mmol/L (ref 3.5–5.1)
Sodium: 133 mmol/L — ABNORMAL LOW (ref 135–145)
TCO2: 27 mmol/L (ref 22–32)

## 2017-04-06 LAB — PROTIME-INR
INR: 1.2
Prothrombin Time: 15.1 seconds (ref 11.4–15.2)

## 2017-04-06 MED ORDER — POTASSIUM CHLORIDE CRYS ER 20 MEQ PO TBCR
20.0000 meq | EXTENDED_RELEASE_TABLET | Freq: Two times a day (BID) | ORAL | Status: AC
  Administered 2017-04-06 (×2): 20 meq via ORAL
  Filled 2017-04-06: qty 1

## 2017-04-06 MED ORDER — WARFARIN SODIUM 5 MG PO TABS
5.0000 mg | ORAL_TABLET | Freq: Once | ORAL | Status: AC
  Administered 2017-04-06: 5 mg via ORAL
  Filled 2017-04-06: qty 1

## 2017-04-06 MED ORDER — FE FUMARATE-B12-VIT C-FA-IFC PO CAPS
1.0000 | ORAL_CAPSULE | Freq: Three times a day (TID) | ORAL | Status: DC
  Administered 2017-04-06 – 2017-04-11 (×15): 1 via ORAL
  Filled 2017-04-06 (×14): qty 1

## 2017-04-06 MED ORDER — METOLAZONE 5 MG PO TABS
5.0000 mg | ORAL_TABLET | Freq: Once | ORAL | Status: AC
  Administered 2017-04-06: 5 mg via ORAL
  Filled 2017-04-06: qty 1

## 2017-04-06 MED ORDER — FUROSEMIDE 10 MG/ML IJ SOLN
40.0000 mg | Freq: Once | INTRAMUSCULAR | Status: AC
  Administered 2017-04-06: 40 mg via INTRAVENOUS
  Filled 2017-04-06: qty 4

## 2017-04-06 MED ORDER — POTASSIUM CHLORIDE 10 MEQ/50ML IV SOLN
10.0000 meq | INTRAVENOUS | Status: AC | PRN
  Administered 2017-04-06 (×3): 10 meq via INTRAVENOUS
  Filled 2017-04-06 (×3): qty 50

## 2017-04-06 NOTE — Progress Notes (Signed)
2 Days Post-Op Procedure(s) (LRB): AORTIC VALVE REPLACEMENT (AVR) (N/A) MITRAL VALVE (MV) REPLACEMENT (N/A) MAZE (N/A) TRANSESOPHAGEAL ECHOCARDIOGRAM (TEE) (N/A) Subjective: No complaints. Walked early this am and tired now.  Objective: Vital signs in last 24 hours: Temp:  [97.7 F (36.5 C)-98.8 F (37.1 C)] 98.8 F (37.1 C) (02/02 0800) Pulse Rate:  [67-89] 67 (02/02 1032) Cardiac Rhythm: Normal sinus rhythm (02/02 0900) Resp:  [9-26] 18 (02/02 1032) BP: (91-124)/(45-107) 105/56 (02/02 1032) SpO2:  [91 %-100 %] 96 % (02/02 1032) Weight:  [63.3 kg (139 lb 9.6 oz)] 63.3 kg (139 lb 9.6 oz) (02/02 0600)  Hemodynamic parameters for last 24 hours:    Intake/Output from previous day: 02/01 0701 - 02/02 0700 In: 1438.6 [P.O.:720; I.V.:668.6; IV Piggyback:50] Out: 1480 [Urine:1430; Chest Tube:50] Intake/Output this shift: Total I/O In: 360 [P.O.:360] Out: -   General appearance: alert and cooperative Neurologic: intact Heart: regular rate and rhythm and crisp mechanical valve click Lungs: diminished breath sounds bibasilar Extremities: edema mild Wound: dressing dry  Lab Results: Recent Labs    04/05/17 0418 04/05/17 1752 04/06/17 0434  WBC 12.4*  --  11.3*  HGB 8.6* 8.8* 8.3*  HCT 25.4* 26.0* 24.6*  PLT 120*  --  94*   BMET:  Recent Labs    04/05/17 0418 04/05/17 1752 04/06/17 0434  NA 136 132* 130*  K 4.3 4.4 4.5  CL 110 100* 100*  CO2 21*  --  21*  GLUCOSE 119* 138* 112*  BUN 12 13 15   CREATININE 0.72 0.70 0.86  CALCIUM 7.8*  --  8.2*    PT/INR:  Recent Labs    04/06/17 0434  LABPROT 15.1  INR 1.20   ABG    Component Value Date/Time   PHART 7.375 04/04/2017 2009   HCO3 18.7 (L) 04/04/2017 2009   TCO2 21 (L) 04/05/2017 1752   ACIDBASEDEF 6.0 (H) 04/04/2017 2009   O2SAT 89.0 04/04/2017 2009   CBG (last 3)  Recent Labs    04/05/17 0747 04/05/17 1117 04/05/17 1616  GLUCAP 115* 105* 99   CXR: bibasilar atelectasis and small  effusions.  Assessment/Plan: S/P Procedure(s) (LRB): AORTIC VALVE REPLACEMENT (AVR) (N/A) MITRAL VALVE (MV) REPLACEMENT (N/A) MAZE (N/A) TRANSESOPHAGEAL ECHOCARDIOGRAM (TEE) (N/A)  She is hemodynamically stable in sinus rhythm.  Volume excess: weight is still 12 lbs over preop and did not diurese much yesterday with lasix 40 bid. Will give 40 this am with metolazone 5 mg.  Continue IS, ambulation for atelectasis.  Coumadin 5 mg today  DC foley and sleeve later today.   LOS: 7 days    Alleen BorneBryan K Ameyah Bangura 04/06/2017

## 2017-04-07 LAB — BASIC METABOLIC PANEL
Anion gap: 9 (ref 5–15)
BUN: 11 mg/dL (ref 6–20)
CO2: 26 mmol/L (ref 22–32)
Calcium: 8.5 mg/dL — ABNORMAL LOW (ref 8.9–10.3)
Chloride: 98 mmol/L — ABNORMAL LOW (ref 101–111)
Creatinine, Ser: 0.77 mg/dL (ref 0.44–1.00)
GFR calc Af Amer: >60 mL/min (ref >60)
GFR calc non Af Amer: >60 mL/min (ref >60)
Glucose, Bld: 112 mg/dL — ABNORMAL HIGH (ref 65–99)
Potassium: 3.9 mmol/L (ref 3.5–5.1)
Sodium: 133 mmol/L — ABNORMAL LOW (ref 135–145)

## 2017-04-07 LAB — CBC
HCT: 25.2 % — ABNORMAL LOW (ref 36.0–46.0)
Hemoglobin: 8.5 g/dL — ABNORMAL LOW (ref 12.0–15.0)
MCH: 31.1 pg (ref 26.0–34.0)
MCHC: 33.7 g/dL (ref 30.0–36.0)
MCV: 92.3 fL (ref 78.0–100.0)
Platelets: 133 10*3/uL — ABNORMAL LOW (ref 150–400)
RBC: 2.73 MIL/uL — ABNORMAL LOW (ref 3.87–5.11)
RDW: 14.7 % (ref 11.5–15.5)
WBC: 10.7 10*3/uL — ABNORMAL HIGH (ref 4.0–10.5)

## 2017-04-07 LAB — TYPE AND SCREEN
ABO/RH(D): A POS
Antibody Screen: NEGATIVE
Unit division: 0
Unit division: 0
Unit division: 0
Unit division: 0

## 2017-04-07 LAB — BPAM RBC
Blood Product Expiration Date: 201902082359
Blood Product Expiration Date: 201902142359
Blood Product Expiration Date: 201902142359
Blood Product Expiration Date: 201902162359
ISSUE DATE / TIME: 201901310827
ISSUE DATE / TIME: 201901310827
Unit Type and Rh: 6200
Unit Type and Rh: 6200
Unit Type and Rh: 6200
Unit Type and Rh: 6200

## 2017-04-07 LAB — PROTIME-INR
INR: 1.19
Prothrombin Time: 15 seconds (ref 11.4–15.2)

## 2017-04-07 MED ORDER — METOPROLOL TARTRATE 12.5 MG HALF TABLET
12.5000 mg | ORAL_TABLET | Freq: Two times a day (BID) | ORAL | Status: DC
  Administered 2017-04-07 – 2017-04-11 (×9): 12.5 mg via ORAL
  Filled 2017-04-07 (×9): qty 1

## 2017-04-07 MED ORDER — WARFARIN SODIUM 5 MG PO TABS
5.0000 mg | ORAL_TABLET | Freq: Once | ORAL | Status: AC
  Administered 2017-04-07: 5 mg via ORAL
  Filled 2017-04-07: qty 1

## 2017-04-07 MED ORDER — POTASSIUM CHLORIDE CRYS ER 20 MEQ PO TBCR
20.0000 meq | EXTENDED_RELEASE_TABLET | Freq: Three times a day (TID) | ORAL | Status: AC
  Administered 2017-04-07 (×3): 20 meq via ORAL
  Filled 2017-04-07 (×3): qty 1

## 2017-04-07 NOTE — Progress Notes (Signed)
3 Days Post-Op Procedure(s) (LRB): AORTIC VALVE REPLACEMENT (AVR) (N/A) MITRAL VALVE (MV) REPLACEMENT (N/A) MAZE (N/A) TRANSESOPHAGEAL ECHOCARDIOGRAM (TEE) (N/A) Subjective: More sore today but otherwise feels fine. No shortness of breath  Objective: Vital signs in last 24 hours: Temp:  [98.8 F (37.1 C)-99.3 F (37.4 C)] 98.8 F (37.1 C) (02/03 0728) Pulse Rate:  [67-81] 78 (02/03 0700) Cardiac Rhythm: Normal sinus rhythm (02/03 0600) Resp:  [15-28] 16 (02/03 0700) BP: (91-118)/(45-75) 103/48 (02/03 0700) SpO2:  [92 %-100 %] 92 % (02/03 0700) Weight:  [60.2 kg (132 lb 11.5 oz)] 60.2 kg (132 lb 11.5 oz) (02/03 0400)  Hemodynamic parameters for last 24 hours:    Intake/Output from previous day: 02/02 0701 - 02/03 0700 In: 1750 [P.O.:1440; I.V.:160; IV Piggyback:150] Out: 4850 [Urine:4850] Intake/Output this shift: Total I/O In: -  Out: 700 [Urine:700]  General appearance: alert and cooperative Neurologic: intact Heart: regular rate and rhythm and crisp mechanical valve click Lungs: diminished breath sounds bibasilar Extremities: extremities normal, atraumatic, no cyanosis or edema Wound: dressing dry  Lab Results: Recent Labs    04/06/17 0434 04/06/17 1551 04/07/17 0431  WBC 11.3*  --  10.7*  HGB 8.3* 7.5* 8.5*  HCT 24.6* 22.0* 25.2*  PLT 94*  --  133*   BMET:  Recent Labs    04/06/17 0434 04/06/17 1551 04/07/17 0431  NA 130* 133* 133*  K 4.5 3.6 3.9  CL 100* 96* 98*  CO2 21*  --  26  GLUCOSE 112* 111* 112*  BUN 15 14 11   CREATININE 0.86 0.80 0.77  CALCIUM 8.2*  --  8.5*    PT/INR:  Recent Labs    04/07/17 0431  LABPROT 15.0  INR 1.19   ABG    Component Value Date/Time   PHART 7.375 04/04/2017 2009   HCO3 18.7 (L) 04/04/2017 2009   TCO2 27 04/06/2017 1551   ACIDBASEDEF 6.0 (H) 04/04/2017 2009   O2SAT 89.0 04/04/2017 2009   CBG (last 3)  Recent Labs    04/05/17 0747 04/05/17 1117 04/05/17 1616  GLUCAP 115* 105* 99     Assessment/Plan: S/P Procedure(s) (LRB): AORTIC VALVE REPLACEMENT (AVR) (N/A) MITRAL VALVE (MV) REPLACEMENT (N/A) MAZE (N/A) TRANSESOPHAGEAL ECHOCARDIOGRAM (TEE) (N/A)  POD 3  She remains hemodynamically stable in sinus rhythm 70-90's will start low dose Lopressor.  Volume excess: diuresed 3L yesterday and weight down 7 lbs. Still 5-6 lbs over preop but still diuresing from the lasix/metolazone yesterday.  INR has not bumped yet. Will give 5 mg Coumadin today.  Followup CXR in am.  Transfer to 4E and continue IS, ambulation.   LOS: 8 days    Alleen BorneBryan K Gavriella Hearst 04/07/2017

## 2017-04-08 ENCOUNTER — Inpatient Hospital Stay (HOSPITAL_COMMUNITY): Payer: Managed Care, Other (non HMO)

## 2017-04-08 DIAGNOSIS — I4892 Unspecified atrial flutter: Secondary | ICD-10-CM

## 2017-04-08 LAB — PROTIME-INR
INR: 1.51
Prothrombin Time: 18.1 seconds — ABNORMAL HIGH (ref 11.4–15.2)

## 2017-04-08 MED ORDER — MOVING RIGHT ALONG BOOK
Freq: Once | Status: AC
  Administered 2017-04-08: 07:00:00
  Filled 2017-04-08: qty 1

## 2017-04-08 MED ORDER — SODIUM CHLORIDE 0.9 % IV SOLN: 250.0000 mL | INTRAVENOUS | Status: DC | PRN

## 2017-04-08 MED ORDER — TRAMADOL HCL 50 MG PO TABS: 50.0000 mg | ORAL_TABLET | ORAL | Status: DC | PRN

## 2017-04-08 MED ORDER — WARFARIN SODIUM 5 MG PO TABS
5.0000 mg | ORAL_TABLET | Freq: Once | ORAL | Status: AC
  Administered 2017-04-08: 5 mg via ORAL
  Filled 2017-04-08: qty 1

## 2017-04-08 MED ORDER — ASPIRIN EC 81 MG PO TBEC: 81.0000 mg | DELAYED_RELEASE_TABLET | Freq: Every day | ORAL | Status: DC

## 2017-04-08 MED ORDER — ACETAMINOPHEN 325 MG PO TABS
650.0000 mg | ORAL_TABLET | Freq: Four times a day (QID) | ORAL | Status: DC | PRN
Start: 2017-04-08 — End: 2017-04-11
  Administered 2017-04-08 – 2017-04-10 (×5): 650 mg via ORAL
  Filled 2017-04-08 (×5): qty 2

## 2017-04-08 MED ORDER — SODIUM CHLORIDE 0.9% FLUSH
3.0000 mL | Freq: Two times a day (BID) | INTRAVENOUS | Status: DC
  Administered 2017-04-08 – 2017-04-10 (×5): 3 mL via INTRAVENOUS

## 2017-04-08 MED ORDER — PANTOPRAZOLE SODIUM 40 MG PO TBEC
40.0000 mg | DELAYED_RELEASE_TABLET | Freq: Every day | ORAL | Status: DC
  Administered 2017-04-08 – 2017-04-11 (×4): 40 mg via ORAL
  Filled 2017-04-08 (×4): qty 1

## 2017-04-08 MED ORDER — ONDANSETRON HCL 4 MG PO TABS: 4.0000 mg | ORAL_TABLET | Freq: Four times a day (QID) | ORAL | Status: DC | PRN

## 2017-04-08 MED ORDER — POTASSIUM CHLORIDE CRYS ER 20 MEQ PO TBCR
20.0000 meq | EXTENDED_RELEASE_TABLET | Freq: Three times a day (TID) | ORAL | Status: AC
  Administered 2017-04-08 (×3): 20 meq via ORAL
  Filled 2017-04-08 (×3): qty 1

## 2017-04-08 MED ORDER — ONDANSETRON HCL 4 MG/2ML IJ SOLN: 4.0000 mg | Freq: Four times a day (QID) | INTRAMUSCULAR | Status: DC | PRN

## 2017-04-08 MED ORDER — SODIUM CHLORIDE 0.9% FLUSH
3.0000 mL | INTRAVENOUS | Status: DC | PRN
  Administered 2017-04-08: 3 mL via INTRAVENOUS
  Filled 2017-04-08: qty 3

## 2017-04-08 MED ORDER — MOVING RIGHT ALONG BOOK
Freq: Once | Status: AC
  Filled 2017-04-08: qty 1

## 2017-04-08 MED ORDER — OXYCODONE HCL 5 MG PO TABS: 5.0000 mg | ORAL_TABLET | ORAL | Status: DC | PRN

## 2017-04-08 MED ORDER — DOCUSATE SODIUM 100 MG PO CAPS
200.0000 mg | ORAL_CAPSULE | Freq: Every day | ORAL | Status: DC
  Administered 2017-04-08 – 2017-04-11 (×4): 200 mg via ORAL
  Filled 2017-04-08 (×4): qty 2

## 2017-04-08 MED ORDER — POTASSIUM CHLORIDE CRYS ER 20 MEQ PO TBCR: 20.0000 meq | EXTENDED_RELEASE_TABLET | Freq: Three times a day (TID) | ORAL | Status: DC

## 2017-04-08 NOTE — Discharge Instructions (Addendum)
What You Need to Know About Warfarin °Warfarin is a blood thinner (anticoagulant). Anticoagulants help to prevent the formation of blood clots. They also help to stop the growth of blood clots. °Who should use warfarin? °Warfarin is prescribed for people who are at risk for developing harmful blood clots, such as people who have: °· Surgically implanted mechanical heart valves. °· Irregular heart rhythms (atrial fibrillation). °· Certain clotting disorders. °· A history of harmful blood clotting in the past. This includes people who have had: °? A stroke. °? Blood clot in the lungs (pulmonary embolism, or PE). °? Blood clot in the legs (deep vein thrombosis, or DVT). °· An existing blood clot. ° °How is warfarin taken? ° °Warfarin is a medicine that you take by mouth (orally). Warfarin tablets come in different strengths. Each tablet strength is a different color, with the amount of warfarin printed on the tablet. If you get a new prescription filled and the color of your tablet is different than usual, tell your pharmacist or health care provider immediately. °What blood tests do I need while taking warfarin? °The goal of warfarin therapy is to lessen the clotting tendency of blood, but not to prevent clotting completely. Your health care provider will monitor the anticoagulation effect of warfarin closely and will adjust your dose as needed. °Warfarin is a medicine that needs to be closely monitored, so it is very important to keep all lab visits and follow-up visits with your health care provider. While taking warfarin, you will need to have blood tests (prothrombin tests, or PT tests) regularly to measure your blood clotting time. This type of test can be done with a finger stick or a blood draw. °What does the INR test result mean? °The PT test results will be reported as the International Normalized Ratio (INR). The INR tells your health care provider whether your dosage of warfarin needs to be changed. The  longer it takes your blood to clot, the higher the INR. °Your health care provider will tell you your target INR range. If your INR is not in your target range, your health care provider may adjust your dosage. °· If your INR is above your target range, there is a risk of bleeding. Your dosage of warfarin may need to be decreased. °· If your INR is below your target range, there is a risk of clotting. Your dosage of warfarin may need to be increased. ° °How often is the INR test needed? °· When you first start warfarin, you will usually have your INR checked every few days. °· You may need to have INR tests done more than once a week until you are taking the correct dosage of warfarin. °· After you have reached your target INR, your INR will be tested less often. However, you will need to have your INR checked at least once every 4-6 weeks for the entire time you are taking warfarin. °What are the side effects of warfarin? °Too much warfarin can cause bleeding (hemorrhage) in any part of the body, such as: °· Bleeding from the gums. °· Unexplained bruises. °· Bruises that get larger. °· Blood in the urine. °· Bloody or dark stools. °· Bleeding in the brain (hemorrhagic stroke). °· A nosebleed that is not easily stopped. °· Coughing up blood. °· Vomiting blood. ° °Warfarin use may also cause: °· Skin rash or irritations °· Nausea that does not go away. °· Severe pain in the back or joints. °· Painful toes that turn blue   or purple (purple toe syndrome). °· Painful ulcers that do not go away (skin necrosis). ° °What are the signs and symptoms of a blood clot? °Too little warfarin can increase the risk of blood clots in your legs, lungs, or arms. °Signs and symptoms of a DVT in your leg or arm may include: °· Pain or swelling in your leg or arm. °· Skin that is red or warm to the touch on your arm or leg. ° °Signs and symptoms of a pulmonary embolism may include: °· Shortness of breath or difficulty breathing. °· Chest  pain. °· Unexplained fever. ° °What are the signs and symptoms of a stroke? °If you are taking too much or too little warfarin, you can have a stroke. Signs and symptoms of a stroke may include: °· Weakness or numbness of your face, arm, or leg, especially on one side of your body. °· Confusion or trouble thinking clearly. °· Difficulty seeing with one or both eyes. °· Difficulty walking or moving your arms or legs. °· Dizziness. °· Loss of balance or coordination. °· Trouble speaking, trouble understanding speech, or both (aphasia). °· Sudden, severe headache with no known cause. °· Partial or total loss of consciousness. ° °What precautions do I need to take while using warfarin? ° °· Take warfarin exactly as told by your health care provider. Doing this helps you avoid bleeding or blood clots that could result in serious injury, pain, or disability. °· Take your medicine at the same time every day. If you forget to take your dose of warfarin, take it as soon as you remember that day. If you do not remember on that day, do not take an extra dose the next day. °· Contact your health care provider if you miss or take an extra dose. Do not change your dosage on your own to make up for missed or extra doses. °· Wear or carry identification that says that you are taking warfarin. °· Make sure that all health care providers, including your dentist, know you are taking warfarin. °· If you need surgery, talk with your health care provider about whether you should stop taking warfarin before your surgery. °· Avoid situations that cause bleeding. You may bleed more easily while taking warfarin. To limit bleeding, take the following actions: °? Use a softer toothbrush. °? Floss with waxed floss, not unwaxed floss. °? Shave with an electric razor, not with a blade. °? Limit your use of sharp objects. °? Avoid potentially harmful activities, such as contact sports. °What do I need to know about warfarin and pregnancy or  breastfeeding? °· Warfarin is not recommended during the first trimester of pregnancy due to an increased risk of birth defects. In certain situations, a woman may take warfarin after her first trimester of pregnancy. °· If you are taking warfarin and you become pregnant or plan to become pregnant, contact your health care provider right away. °· If you plan to breastfeed while taking warfarin, talk with your health care provider first. °What do I need to know about warfarin and alcohol or drug use? °· Avoid drinking alcohol, or limit alcohol intake to no more than 1 drink a day for nonpregnant women and 2 drinks a day for men. One drink equals 12 oz of beer, 5 oz of wine, or 1½ oz of hard liquor. °? If you change the amount of alcohol that you drink, tell your health care provider. Your warfarin dosage may need to be changed. °· Avoid tobacco   products, such as cigarettes, chewing tobacco, and e-cigarettes. If you need help quitting, ask your health care provider. °? If you change the amount of nicotine or tobacco that you use, tell your health care provider. Your warfarin dosage may need to be changed. °· Avoid street drugs while taking warfarin. The effects of street drugs on warfarin are not known. °What do I need to know about warfarin and other medicines or supplements? °· Many prescription and over-the-counter medicines can interfere with warfarin. Talk with your health care provider or your pharmacist before starting or stopping any new medicines. This includes over-the-counter vitamins, dietary supplements, herbal medicines, and pain medicines. Your warfarin dosage may need to be adjusted. °· Some common over-the-counter medicines that may increase the risk of bleeding while taking warfarin include: °? Acetaminophen. °? Aspirin °? NSAIDs, such as ibuprofen or naproxen. °? Vitamin E. °What do I need to know about warfarin and my diet? °· It is important to maintain a normal, balanced diet while taking  warfarin. Avoid major changes in your diet. If you are going to change your diet, talk with your health care provider before making changes. °· Your health care provider may recommend that you work with a diet and nutrition specialist (dietitian). °· Vitamin K decreases the effect of warfarin, and it is found in many foods. Eat a consistent amount of foods that contain vitamin K. For example, you may decide to eat 2 vitamin K-containing foods each day. °Most foods that are high in vitamin K are green and leafy. Common foods that contain high amounts of vitamin K include: °· Kale, raw or cooked. °· Spinach, raw or cooked. °· Collards, raw or cooked. °· Swiss chard, raw or cooked. °· Mustard greens, raw or cooked. °· Turnip greens, raw or cooked. °· Parsley, raw. °· Broccoli, cooked. °· Noodles, eggs, and spinach, enriched. °· Brussels sprouts, raw or cooked. °· Beet greens, raw or cooked. °· Endive, raw. °· Cabbage, cooked. °· Asparagus, cooked. ° °Foods that contain moderate amounts of vitamin K include: °· Broccoli, raw. °· Cabbage, raw. °· Bok choy, cooked. °· Green leaf lettuce, raw °· Prunes, stewed. °· Pickles. °· Kiwi. °· Edamame, cooked. °· Romaine lettuce, raw. °· Avocado. °· Tuna, canned in oil. °· Okra, cooked. °· Black-eyed peas, cooked. °· Green beans, cooked or raw. °· Blueberries, raw. °· Blackberries, raw. °· Peas, cooked or raw. ° °Contact a health care provider if: °· You miss a dose. °· You take an extra dose. °· You plan to have any kind of surgery or procedure. °· You are unable to take your medicine due to nausea, vomiting, or diarrhea. °· You have any major changes in your diet or you plan to make any major changes in your diet. °· You start or stop any over-the-counter medicine, prescription medicine, or dietary supplement. °· You become pregnant, plan to become pregnant, or think you may be pregnant. °· You have menstrual periods that are heavier than usual. °· You have unusual bruising. °Get  help right away if: °· You develop symptoms of an allergic reaction, such as: °? Swelling of the lips, face, tongue, mouth, or throat. °? Rash. °? Itching. °? Itchy, red, swollen areas of skin (hives). °? Trouble breathing. °? Chest tightness. °· You have: °? Signs or symptoms of a stroke. °? Signs or symptoms of a blood clot. °? A fall or have an accident, especially if you hit your head. °? Blood in your urine. Your urine may look reddish,   pinkish, or tea-colored. ? Blood in your stool. Your stool may be black or bright red. ? Bleeding that does not stop after applying pressure to the area for 30 minutes. ? Severe pain in your joints or back. ? Purple or blue toes. ? Skin ulcers that do not go away.  You vomit blood or cough up blood. The blood may be bright red, or it may look like coffee grounds. These symptoms may represent a serious problem that is an emergency. Do not wait to see if the symptoms will go away. Get medical help right away. Call your local emergency services (911 in the [place] Do not drive yourself to the hospital. Summary  Warfarin needs to be closely monitored with blood tests. It is very important to keep all lab visits and follow-up visits with your health care provider.  Make sure that you know your target INR range and your warfarin dosage.  Wear or carry identification that says that you are taking warfarin.  Take warfarin at the same time every day. Call your health care provider if you miss a dose or if you take an extra dose. Do not change the dosage of warfarin on your own.  Know the signs and symptoms of blood clots, bleeding, and a stroke. Know when to get emergency medical help.  Tell all health care providers who care for you that you are taking warfarin.  Talk with your health care provider or your pharmacist before starting or stopping any new medicines.  Monitor how much vitamin K you eat every day. Try to eat the same amount every day. This  information is not intended to replace advice given to you by your health care provider. Make sure you discuss any questions you have with your health care provider. Document Released: 02/19/2005 Document Revised: 11/01/2015 Document Reviewed: 05/18/2015 Elsevier Interactive Patient Education  2017 [employer] Mitral Valve Replacement, Care After This sheet gives you information about how to care for yourself after your procedure. Your health care provider may also give you more specific instructions. If you have problems or questions, contact your health care provider. What can I expect after the procedure? After the procedure, it is common to have:  Pain at the incision area that may last for several weeks.  Follow these instructions at home: Incision care  Follow instructions from your health care provider about how to take care of your incision. Make sure you: ? Wash your hands with soap and water before you change your bandage (dressing). If soap and water are not available, use hand sanitizer. ? Change your dressing as told by your health care provider. ? Leave stitches (sutures), skin glue, or adhesive strips in place. These skin closures may need to stay in place for 2 weeks or longer. If adhesive strip edges start to loosen and curl up, you may trim the loose edges. Do not remove adhesive strips completely unless your health care provider tells you to do that.  Check your incision area every day for signs of infection. Check for: ? More redness, swelling, or pain. ? More fluid or blood. ? Warmth. ? Pus or a bad smell.  Do not apply powder or lotion to the area. Driving  Do not drive until your health care provider approves.  Do not drive or use heavy machinery while taking prescription pain medicines. Bathing  Do not take baths, swim, or use a hot tub for 2-4 weeks after surgery, or until your health care provider approves.  Ask your health care provider if you may take  showers.  To wash the incision site, gently wash with soap and water and pat the area dry with a clean towel. Do not rub the incision area. That may cause bleeding. Activity  Rest as told by your health care provider. Ask your health care provider when you can resume normal activities, including sexual activity.  Avoid the following activities for 6-8 weeks, or as long as directed: ? Lifting anything that is heavier than 10 lb (4.5 kg), or the limit that your health care provider tells you. ? Pushing or pulling things with your arms.  Avoid climbing stairs and using the handrail to pull yourself up for the first 2-3 weeks after surgery.  Avoid airplane travel for 4-6 weeks, or as long as directed.  Avoid sitting for long periods of time and crossing your legs. Get up and move around at least once every 1-2 hours.  If you are taking blood thinners (anticoagulants), avoid activities that have a high risk of injury. Ask your health care provider what activities are safe for you. Lifestyle  Limit alcohol intake to no more than 1 drink a day for nonpregnant women and 2 drinks a day for men. One drink equals 12 oz of beer, 5 oz of wine, or 1 oz of hard liquor.  Do not use any products that contain nicotine or tobacco, such as cigarettes and e-cigarettes. If you need help quitting, ask your health care provider. General instructions  Take your temperature every day and weigh yourself every morning for the first 7 days after surgery. Write your temperatures and weight down and take this record with you to any follow-up visits.  Take over-the-counter and prescription medicines only as told by your health care provider.  To prevent or treat constipation while you are taking prescription pain medicine, your health care provider may recommend that you: ? Drink enough fluid to keep your urine clear or pale yellow. ? Take over-the-counter or prescription medicines. ? Eat foods that are high in  fiber, such as fresh fruits and vegetables, whole grains, and beans. ? Limit foods that are high in fat and processed sugars, such as fried and sweet foods.  Follow instructions from your health care provider about eating or drinking restrictions.  Wear compression stockings for at least 2 weeks, or as long as told by your health care provider. These stockings help to prevent blood clots and reduce swelling in your legs. If your ankles are swollen after 2 weeks, continue to wear the stockings.  Keep all follow-up visits as told by your health care provider. This is important. Contact a health care provider if:  You develop a skin rash.  Your weight is increasing each day over 2-3 days.  You gain 2 lb (1 kg) or more in a single day.  You have a fever. Get help right away if:  You develop chest pain that feels different from the pain caused by your incision.  You develop shortness of breath or difficulty breathing.  You have more redness, swelling, or pain around your incision.  You have more fluid or blood coming from your incision.  Your incision feels warm to the touch.  You have pus or a bad smell coming from your incision.  You feel light-headed. This information is not intended to replace advice given to you by your health care provider. Make sure you discuss any questions you have with your health care provider. Document Released:  09/08/2004 Document Revised: 12/02/2015 Document Reviewed: 12/02/2015 Elsevier Interactive Patient Education  2018 [employer]  Aortic Valve Replacement, Care After Refer to this sheet in the next few weeks. These instructions provide you with information about caring for yourself after your procedure. Your health care provider may also give you more specific instructions. Your treatment has been planned according to current medical practices, but problems sometimes occur. Call your health care provider if you have any problems or questions  after your procedure. What can I expect after the procedure? After the procedure, it is common to have:  Pain around your incision area.  A small amount of blood or clear fluid coming from your incision.  Follow these instructions at home: Eating and drinking   Follow instructions from your health care provider about eating or drinking restrictions. ? Limit alcohol intake to no more than 1 drink per day for nonpregnant women and 2 drinks per day for men. One drink equals 12 oz of beer, 5 oz of wine, or 1 oz of hard liquor. ? Limit how much caffeine you drink. Caffeine can affect your heart's rate and rhythm.  Drink enough fluid to keep your urine clear or pale yellow.  Eat a heart-healthy diet. This should include plenty of fresh fruits and vegetables. If you eat meat, it should be lean cuts. Avoid foods that are: ? High in salt, saturated fat, or sugar. ? Canned or highly processed. ? Fried. Activity  Return to your normal activities as told by your health care provider. Ask your health care provider what activities are safe for you.  Exercise regularly once you have recovered, as told by your health care provider.  Avoid sitting for more than 2 hours at a time without moving. Get up and move around at least once every 1-2 hours. This helps to prevent blood clots in the legs.  Do not lift anything that is heavier than 10 lb (4.5 kg) until your health care provider approves.  Avoid pushing or pulling things with your arms until your health care provider approves. This includes pulling on handrails to help you climb stairs. Incision care   Follow instructions from your health care provider about how to take care of your incision. Make sure you: ? Wash your hands with soap and water before you change your bandage (dressing). If soap and water are not available, use hand sanitizer. ? Change your dressing as told by your health care provider. ? Leave stitches (sutures), skin glue,  or adhesive strips in place. These skin closures may need to stay in place for 2 weeks or longer. If adhesive strip edges start to loosen and curl up, you may trim the loose edges. Do not remove adhesive strips completely unless your health care provider tells you to do that.  Check your incision area every day for signs of infection. Check for: ? More redness, swelling, or pain. ? More fluid or blood. ? Warmth. ? Pus or a bad smell. Medicines  Take over-the-counter and prescription medicines only as told by your health care provider.  If you were prescribed an antibiotic medicine, take it as told by your health care provider. Do not stop taking the antibiotic even if you start to feel better. Travel  Avoid airplane travel for as long as told by your health care provider.  When you travel, bring a list of your medicines and a record of your medical history with you. Carry your medicines with you. Driving  Ask your  health care provider when it is safe for you to drive. Do not drive until your health care provider approves.  Do not drive or operate heavy machinery while taking prescription pain medicine. Lifestyle   Do not use any tobacco products, such as cigarettes, chewing tobacco, or e-cigarettes. If you need help quitting, ask your health care provider.  Resume sexual activity as told by your health care provider. Do not use medicines for erectile dysfunction unless your health care provider approves, if this applies.  Work with your health care provider to keep your blood pressure and cholesterol under control, and to manage any other heart conditions that you have.  Maintain a healthy weight. General instructions  Do not take baths, swim, or use a hot tub until your health care provider approves.  Do not strain to have a bowel movement.  Avoid crossing your legs while sitting down.  Check your temperature every day for a fever. A fever may be a sign of infection.  If  you are a woman and you plan to become pregnant, talk with your health care provider before you become pregnant.  Wear compression stockings if your health care provider instructs you to do this. These stockings help to prevent blood clots and reduce swelling in your legs.  Tell all health care providers who care for you that you have an artificial (prosthetic) aortic valve. If you have or have had heart disease or endocarditis, tell all health care providers about these conditions as well.  Keep all follow-up visits as told by your health care provider. This is important. Contact a health care provider if:  You develop a skin rash.  You experience sudden, unexplained changes in your weight.  You have more redness, swelling, or pain around your incision.  You have more fluid or blood coming from your incision.  Your incision feels warm to the touch.  You have pus or a bad smell coming from your incision.  You have a fever. Get help right away if:  You develop chest pain that is different from the pain coming from your incision.  You develop shortness of breath or difficulty breathing.  You start to feel light-headed. These symptoms may represent a serious problem that is an emergency. Do not wait to see if the symptoms will go away. Get medical help right away. Call your local emergency services (911 in the [place] Do not drive yourself to the hospital. This information is not intended to replace advice given to you by your health care provider. Make sure you discuss any questions you have with your health care provider. Document Released: 09/07/2004 Document Revised: 07/28/2015 Document Reviewed: 01/23/2015 Elsevier Interactive Patient Education  2017 [employer]. ------------------------------ Information on my medicine - Coumadin   (Warfarin)  This medication education was reviewed with me or my healthcare representative as part of my discharge preparation.    Why was  Coumadin prescribed for you? Coumadin was prescribed for you because you have a blood clot or a medical condition that can cause an increased risk of forming blood clots. Blood clots can cause serious health problems by blocking the flow of blood to the heart, lung, or brain. Coumadin can prevent harmful blood clots from forming. As a reminder your indication for Coumadin is:   Blood Clot Prevention After Heart Valve Surgery  What test will check on my response to Coumadin? While on Coumadin (warfarin) you will need to have an INR test regularly to ensure that your dose  is keeping you in the desired range. The INR (international normalized ratio) number is calculated from the result of the laboratory test called prothrombin time (PT).  If an INR APPOINTMENT HAS NOT ALREADY BEEN MADE FOR YOU please schedule an appointment to have this lab work done by your health care provider within 7 days. Your INR goal is usually a number between:  2 to 3 or your provider may give you a more narrow range like 2-2.5.  Ask your health care provider during an office visit what your goal INR is.  What  do you need to  know  About  COUMADIN? Take Coumadin (warfarin) exactly as prescribed by your healthcare provider about the same time each day.  DO NOT stop taking without talking to the doctor who prescribed the medication.  Stopping without other blood clot prevention medication to take the place of Coumadin may increase your risk of developing a new clot or stroke.  Get refills before you run out.  What do you do if you miss a dose? If you miss a dose, take it as soon as you remember on the same day then continue your regularly scheduled regimen the next day.  Do not take two doses of Coumadin at the same time.  Important Safety Information A possible side effect of Coumadin (Warfarin) is an increased risk of bleeding. You should call your healthcare provider right away if you experience any of the  following: ? Bleeding from an injury or your nose that does not stop. ? Unusual colored urine (red or dark brown) or unusual colored stools (red or black). ? Unusual bruising for unknown reasons. ? A serious fall or if you hit your head (even if there is no bleeding).  Some foods or medicines interact with Coumadin (warfarin) and might alter your response to warfarin. To help avoid this: ? Eat a balanced diet, maintaining a consistent amount of Vitamin K. ? Notify your provider about major diet changes you plan to make. ? Avoid alcohol or limit your intake to 1 drink for women and 2 drinks for men per day. (1 drink is 5 oz. wine, 12 oz. beer, or 1.5 oz. liquor.)  Make sure that ANY health care provider who prescribes medication for you knows that you are taking Coumadin (warfarin).  Also make sure the healthcare provider who is monitoring your Coumadin knows when you have started a new medication including herbals and non-prescription products.  Coumadin (Warfarin)  Major Drug Interactions  Increased Warfarin Effect Decreased Warfarin Effect  Alcohol (large quantities) Antibiotics (esp. Septra/Bactrim, Flagyl, Cipro) Amiodarone (Cordarone) Aspirin (ASA) Cimetidine (Tagamet) Megestrol (Megace) NSAIDs (ibuprofen, naproxen, etc.) Piroxicam (Feldene) Propafenone (Rythmol SR) Propranolol (Inderal) Isoniazid (INH) Posaconazole (Noxafil) Barbiturates (Phenobarbital) Carbamazepine (Tegretol) Chlordiazepoxide (Librium) Cholestyramine (Questran) Griseofulvin Oral Contraceptives Rifampin Sucralfate (Carafate) Vitamin K   Coumadin (Warfarin) Major Herbal Interactions  Increased Warfarin Effect Decreased Warfarin Effect  Garlic Ginseng Ginkgo biloba Coenzyme Q10 Green tea St. Johns wort    Coumadin (Warfarin) FOOD Interactions  Eat a consistent number of servings per week of foods HIGH in Vitamin K (1 serving =  cup)  Collards (cooked, or boiled & drained) Kale (cooked, or  boiled & drained) Mustard greens (cooked, or boiled & drained) Parsley *serving size only =  cup Spinach (cooked, or boiled & drained) Swiss chard (cooked, or boiled & drained) Turnip greens (cooked, or boiled & drained)  Eat a consistent number of servings per week of foods MEDIUM-HIGH in Vitamin K (1 serving =  1 cup)  Asparagus (cooked, or boiled & drained) Broccoli (cooked, boiled & drained, or raw & chopped) Brussel sprouts (cooked, or boiled & drained) *serving size only =  cup Lettuce, raw (green leaf, endive, romaine) Spinach, raw Turnip greens, raw & chopped   These websites have more information on Coumadin (warfarin):  http://www.king-russell.com/; https://www.hines.net/;    Mitral Valve Replacement, Care After This sheet gives you information about how to care for yourself after your procedure. Your health care provider may also give you more specific instructions. If you have problems or questions, contact your health care provider. What can I expect after the procedure? After the procedure, it is common to have: Pain at the incision area that may last for several weeks.  Follow these instructions at home: Incision care Follow instructions from your health care provider about how to take care of your incision. Make sure you: Wash your hands with soap and water before you change your bandage (dressing). If soap and water are not available, use hand sanitizer. Change your dressing as told by your health care provider. Leave stitches (sutures), skin glue, or adhesive strips in place. These skin closures may need to stay in place for 2 weeks or longer. If adhesive strip edges start to loosen and curl up, you may trim the loose edges. Do not remove adhesive strips completely unless your health care provider tells you to do that. Check your incision area every day for signs of infection. Check for: More redness, swelling, or pain. More fluid or blood. Warmth. Pus or a bad  smell. Do not apply powder or lotion to the area. Driving Do not drive until your health care provider approves. Do not drive or use heavy machinery while taking prescription pain medicines. Bathing Do not take baths, swim, or use a hot tub for 2-4 weeks after surgery, or until your health care provider approves. Ask your health care provider if you may take showers. To wash the incision site, gently wash with soap and water and pat the area dry with a clean towel. Do not rub the incision area. That may cause bleeding. Activity Rest as told by your health care provider. Ask your health care provider when you can resume normal activities, including sexual activity. Avoid the following activities for 6-8 weeks, or as long as directed: Lifting anything that is heavier than 10 lb (4.5 kg), or the limit that your health care provider tells you. Pushing or pulling things with your arms. Avoid climbing stairs and using the handrail to pull yourself up for the first 2-3 weeks after surgery. Avoid airplane travel for 4-6 weeks, or as long as directed. Avoid sitting for long periods of time and crossing your legs. Get up and move around at least once every 1-2 hours. If you are taking blood thinners (anticoagulants), avoid activities that have a high risk of injury. Ask your health care provider what activities are safe for you. Lifestyle Limit alcohol intake to no more than 1 drink a day for nonpregnant women and 2 drinks a day for men. One drink equals 12 oz of beer, 5 oz of wine, or 1 oz of hard liquor. Do not use any products that contain nicotine or tobacco, such as cigarettes and e-cigarettes. If you need help quitting, ask your health care provider. General instructions Take your temperature every day and weigh yourself every morning for the first 7 days after surgery. Write your temperatures and weight down and take this record with you to  any follow-up visits. Take over-the-counter and  prescription medicines only as told by your health care provider. To prevent or treat constipation while you are taking prescription pain medicine, your health care provider may recommend that you: Drink enough fluid to keep your urine clear or pale yellow. Take over-the-counter or prescription medicines. Eat foods that are high in fiber, such as fresh fruits and vegetables, whole grains, and beans. Limit foods that are high in fat and processed sugars, such as fried and sweet foods. Follow instructions from your health care provider about eating or drinking restrictions. Wear compression stockings for at least 2 weeks, or as long as told by your health care provider. These stockings help to prevent blood clots and reduce swelling in your legs. If your ankles are swollen after 2 weeks, continue to wear the stockings. Keep all follow-up visits as told by your health care provider. This is important. Contact a health care provider if: You develop a skin rash. Your weight is increasing each day over 2-3 days. You gain 2 lb (1 kg) or more in a single day. You have a fever. Get help right away if: You develop chest pain that feels different from the pain caused by your incision. You develop shortness of breath or difficulty breathing. You have more redness, swelling, or pain around your incision. You have more fluid or blood coming from your incision. Your incision feels warm to the touch. You have pus or a bad smell coming from your incision. You feel light-headed. This information is not intended to replace advice given to you by your health care provider. Make sure you discuss any questions you have with your health care provider. Document Released: 09/08/2004 Document Revised: 12/02/2015 Document Reviewed: 12/02/2015 Elsevier Interactive Patient Education  2018 [employer]  Aortic Valve Replacement, Care After Refer to this sheet in the next few weeks. These instructions provide you with  information about caring for yourself after your procedure. Your health care provider may also give you more specific instructions. Your treatment has been planned according to current medical practices, but problems sometimes occur. Call your health care provider if you have any problems or questions after your procedure. What can I expect after the procedure? After the procedure, it is common to have:  Pain around your incision area.  A small amount of blood or clear fluid coming from your incision.  Follow these instructions at home: Eating and drinking   Follow instructions from your health care provider about eating or drinking restrictions. ? Limit alcohol intake to no more than 1 drink per day for nonpregnant women and 2 drinks per day for men. One drink equals 12 oz of beer, 5 oz of wine, or 1 oz of hard liquor. ? Limit how much caffeine you drink. Caffeine can affect your heart's rate and rhythm.  Drink enough fluid to keep your urine clear or pale yellow.  Eat a heart-healthy diet. This should include plenty of fresh fruits and vegetables. If you eat meat, it should be lean cuts. Avoid foods that are: ? High in salt, saturated fat, or sugar. ? Canned or highly processed. ? Fried. Activity  Return to your normal activities as told by your health care provider. Ask your health care provider what activities are safe for you.  Exercise regularly once you have recovered, as told by your health care provider.  Avoid sitting for more than 2 hours at a time without moving. Get up and move around at least once  every 1-2 hours. This helps to prevent blood clots in the legs.  Do not lift anything that is heavier than 10 lb (4.5 kg) until your health care provider approves.  Avoid pushing or pulling things with your arms until your health care provider approves. This includes pulling on handrails to help you climb stairs. Incision care   Follow instructions from your health care  provider about how to take care of your incision. Make sure you: ? Wash your hands with soap and water before you change your bandage (dressing). If soap and water are not available, use hand sanitizer. ? Change your dressing as told by your health care provider. ? Leave stitches (sutures), skin glue, or adhesive strips in place. These skin closures may need to stay in place for 2 weeks or longer. If adhesive strip edges start to loosen and curl up, you may trim the loose edges. Do not remove adhesive strips completely unless your health care provider tells you to do that.  Check your incision area every day for signs of infection. Check for: ? More redness, swelling, or pain. ? More fluid or blood. ? Warmth. ? Pus or a bad smell. Medicines  Take over-the-counter and prescription medicines only as told by your health care provider.  If you were prescribed an antibiotic medicine, take it as told by your health care provider. Do not stop taking the antibiotic even if you start to feel better. Travel  Avoid airplane travel for as long as told by your health care provider.  When you travel, bring a list of your medicines and a record of your medical history with you. Carry your medicines with you. Driving  Ask your health care provider when it is safe for you to drive. Do not drive until your health care provider approves.  Do not drive or operate heavy machinery while taking prescription pain medicine. Lifestyle   Do not use any tobacco products, such as cigarettes, chewing tobacco, or e-cigarettes. If you need help quitting, ask your health care provider.  Resume sexual activity as told by your health care provider. Do not use medicines for erectile dysfunction unless your health care provider approves, if this applies.  Work with your health care provider to keep your blood pressure and cholesterol under control, and to manage any other heart conditions that you have.  Maintain a  healthy weight. General instructions  Do not take baths, swim, or use a hot tub until your health care provider approves.  Do not strain to have a bowel movement.  Avoid crossing your legs while sitting down.  Check your temperature every day for a fever. A fever may be a sign of infection.  If you are a woman and you plan to become pregnant, talk with your health care provider before you become pregnant.  Wear compression stockings if your health care provider instructs you to do this. These stockings help to prevent blood clots and reduce swelling in your legs.  Tell all health care providers who care for you that you have an artificial (prosthetic) aortic valve. If you have or have had heart disease or endocarditis, tell all health care providers about these conditions as well.  Keep all follow-up visits as told by your health care provider. This is important. Contact a health care provider if:  You develop a skin rash.  You experience sudden, unexplained changes in your weight.  You have more redness, swelling, or pain around your incision.  You have more  fluid or blood coming from your incision.  Your incision feels warm to the touch.  You have pus or a bad smell coming from your incision.  You have a fever. Get help right away if:  You develop chest pain that is different from the pain coming from your incision.  You develop shortness of breath or difficulty breathing.  You start to feel light-headed. These symptoms may represent a serious problem that is an emergency. Do not wait to see if the symptoms will go away. Get medical help right away. Call your local emergency services (911 in the [place] Do not drive yourself to the hospital. This information is not intended to replace advice given to you by your health care provider. Make sure you discuss any questions you have with your health care provider. Document Released: 09/07/2004 Document Revised: 07/28/2015  Document Reviewed: 01/23/2015 Elsevier Interactive Patient Education  2017 [employer].

## 2017-04-08 NOTE — Progress Notes (Signed)
4 Days Post-Op Procedure(s) (LRB): AORTIC VALVE REPLACEMENT (AVR) (N/A) MITRAL VALVE (MV) REPLACEMENT (N/A) MAZE (N/A) TRANSESOPHAGEAL ECHOCARDIOGRAM (TEE) (N/A) Subjective: No complaints. Ambulating well  Objective: Vital signs in last 24 hours: Temp:  [98.3 F (36.8 C)-98.9 F (37.2 C)] 98.6 F (37 C) (02/04 0400) Pulse Rate:  [70-90] 70 (02/04 0552) Cardiac Rhythm: Normal sinus rhythm (02/04 0400) Resp:  [16-29] 18 (02/04 0552) BP: (90-130)/(53-72) 97/61 (02/04 0400) SpO2:  [91 %-98 %] 98 % (02/04 0552) Weight:  [57.7 kg (127 lb 3.3 oz)] 57.7 kg (127 lb 3.3 oz) (02/04 0552)  Hemodynamic parameters for last 24 hours:    Intake/Output from previous day: 02/03 0701 - 02/04 0700 In: 720 [P.O.:720] Out: 5700 [Urine:5700] Intake/Output this shift: No intake/output data recorded.  General appearance: alert and cooperative Neurologic: intact Heart: regular rate and rhythm, S1, S2 normal, no murmur, click, rub or gallop Lungs: decreased in bases Extremities: extremities normal, atraumatic, no cyanosis or edema Wound: incision ok  Lab Results: Recent Labs    04/06/17 0434 04/06/17 1551 04/07/17 0431  WBC 11.3*  --  10.7*  HGB 8.3* 7.5* 8.5*  HCT 24.6* 22.0* 25.2*  PLT 94*  --  133*   BMET:  Recent Labs    04/06/17 0434 04/06/17 1551 04/07/17 0431  NA 130* 133* 133*  K 4.5 3.6 3.9  CL 100* 96* 98*  CO2 21*  --  26  GLUCOSE 112* 111* 112*  BUN 15 14 11   CREATININE 0.86 0.80 0.77  CALCIUM 8.2*  --  8.5*    PT/INR:  Recent Labs    04/08/17 0310  LABPROT 18.1*  INR 1.51   ABG    Component Value Date/Time   PHART 7.375 04/04/2017 2009   HCO3 18.7 (L) 04/04/2017 2009   TCO2 27 04/06/2017 1551   ACIDBASEDEF 6.0 (H) 04/04/2017 2009   O2SAT 89.0 04/04/2017 2009   CBG (last 3)  Recent Labs    04/05/17 0747 04/05/17 1117 04/05/17 1616  GLUCAP 115* 105* 99    Assessment/Plan: S/P Procedure(s) (LRB): AORTIC VALVE REPLACEMENT (AVR) (N/A) MITRAL  VALVE (MV) REPLACEMENT (N/A) MAZE (N/A) TRANSESOPHAGEAL ECHOCARDIOGRAM (TEE) (N/A)  POD 4  She remains hemodynamically stable in sinus rhythm on low dose Lopressor.  Diuresed well yesterday without any diuretic and is back to baseline. Will give some additional K+ today.  CXR shows small residual effusions and mild bibasilar atelectasis but that should resolve without any further diuretic. Continue IS.  INR bumped to 1.5. Will give 5 mg Coumadin today. Goal INR 2.5-3.5 with 2 mechanical valves.  Remove pacing wires today.  Awaiting bed on 4E.  Possibly home tomorrow if INR >2.   LOS: 9 days    Alleen BorneBryan K Roselynn Whitacre 04/08/2017

## 2017-04-08 NOTE — Plan of Care (Signed)
Pt states understanding and complies with routine post-op care. Increased activity, advancing diet and independent pulm. Toilet.  Pt able ambulate 370 ft with minimal assistance 3 times per day without difficulty.  Pt respiratory status improved on room air, only needing low flow O2 while sleeping.

## 2017-04-08 NOTE — Progress Notes (Signed)
Patient ID: Sharon Figueroa, female   DOB: 06/27/1960, 57 y.o.   MRN: [id number] EVENING ROUNDS NOTE :     [address]       [place], [id number]             [phone]                 4 Days Post-Op Procedure(s) (LRB): AORTIC VALVE REPLACEMENT (AVR) (N/A) MITRAL VALVE (MV) REPLACEMENT (N/A) MAZE (N/A) TRANSESOPHAGEAL ECHOCARDIOGRAM (TEE) (N/A)  Total Length of Stay:  LOS: 9 days  BP 109/65 (BP Location: Right Arm)   Pulse 85   Temp 99 F (37.2 C) (Oral)   Resp (!) 24   Ht 5\' 6"  (1.676 m)   Wt 127 lb 3.3 oz (57.7 kg)   SpO2 95%   BMI 20.53 kg/m   .Intake/Output      02/04 0701 - 02/05 0700   P.O. 1440   Total Intake(mL/kg) 1440 (25)   Urine (mL/kg/hr) 1300 (1.8)   Stool 0   Total Output 1300   Net +140       Urine Occurrence 1 x   Stool Occurrence 1 x     . sodium chloride       Lab Results  Component Value Date   WBC 10.7 (H) 04/07/2017   HGB 8.5 (L) 04/07/2017   HCT 25.2 (L) 04/07/2017   PLT 133 (L) 04/07/2017   GLUCOSE 112 (H) 04/07/2017   NA 133 (L) 04/07/2017   K 3.9 04/07/2017   CL 98 (L) 04/07/2017   CREATININE 0.77 04/07/2017   BUN 11 04/07/2017   CO2 26 04/07/2017   TSH 3.800 03/30/2017   INR 1.51 04/08/2017   HGBA1C 5.3 03/30/2017   Waiting since yesterday for 4e bed  Stable day   [name] B [name] MD  Beeper [phone] Office [phone] 04/08/2017 7:16 PM

## 2017-04-09 LAB — PROTIME-INR
INR: 1.6
Prothrombin Time: 18.9 seconds — ABNORMAL HIGH (ref 11.4–15.2)

## 2017-04-09 MED ORDER — TRAMADOL HCL 50 MG PO TABS: 50.0000 mg | ORAL_TABLET | ORAL | 0 refills | Status: DC | PRN

## 2017-04-09 MED ORDER — ACETAMINOPHEN 325 MG PO TABS: 650.0000 mg | ORAL_TABLET | Freq: Four times a day (QID) | ORAL | Status: AC | PRN

## 2017-04-09 MED ORDER — METOPROLOL TARTRATE 25 MG PO TABS: 12.5000 mg | ORAL_TABLET | Freq: Two times a day (BID) | ORAL | 1 refills | Status: DC

## 2017-04-09 MED ORDER — WARFARIN SODIUM 7.5 MG PO TABS
7.5000 mg | ORAL_TABLET | Freq: Every day | ORAL | Status: DC
  Administered 2017-04-09 – 2017-04-10 (×2): 7.5 mg via ORAL
  Filled 2017-04-09 (×2): qty 1

## 2017-04-09 MED FILL — Sodium Chloride IV Soln 0.9%: INTRAVENOUS | Qty: 2000 | Status: AC

## 2017-04-09 MED FILL — Mannitol IV Soln 20%: INTRAVENOUS | Qty: 500 | Status: AC

## 2017-04-09 MED FILL — Electrolyte-R (PH 7.4) Solution: INTRAVENOUS | Qty: 4000 | Status: AC

## 2017-04-09 MED FILL — Heparin Sodium (Porcine) Inj 1000 Unit/ML: INTRAMUSCULAR | Qty: 20 | Status: AC

## 2017-04-09 MED FILL — Lidocaine HCl IV Inj 20 MG/ML: INTRAVENOUS | Qty: 5 | Status: AC

## 2017-04-09 MED FILL — Sodium Bicarbonate IV Soln 8.4%: INTRAVENOUS | Qty: 50 | Status: AC

## 2017-04-09 NOTE — Progress Notes (Signed)
Transfer from 2H per wheelchair, alert and oriented, pain free. Oriented to room and staff,connected to heart monitor,CCMD notified V/S checked and recorded.Will continue to monitor.

## 2017-04-09 NOTE — Progress Notes (Addendum)
      [address]       [place] [id number]             [phone]        5 Days Post-Op Procedure(s) (LRB): AORTIC VALVE REPLACEMENT (AVR) (N/A) MITRAL VALVE (MV) REPLACEMENT (N/A) MAZE (N/A) TRANSESOPHAGEAL ECHOCARDIOGRAM (TEE) (N/A)  Subjective: Patient without specific complaints this am. She hopes to go home soon.  Objective: Vital signs in last 24 hours: Temp:  [98.4 F (36.9 C)-99.3 F (37.4 C)] 98.7 F (37.1 C) (02/05 0413) Pulse Rate:  [77-87] 85 (02/05 0413) Cardiac Rhythm: Normal sinus rhythm (02/05 0413) Resp:  [16-26] 18 (02/05 0413) BP: (94-111)/(55-70) 99/63 (02/05 0413) SpO2:  [93 %-95 %] 94 % (02/05 0413) Weight:  [124 lb 11.2 oz (56.6 kg)] 124 lb 11.2 oz (56.6 kg) (02/05 0415)  Pre op weight 57.7 kg Current Weight  04/09/17 124 lb 11.2 oz (56.6 kg)      Intake/Output from previous day: 02/04 0701 - 02/05 0700 In: 1440 [P.O.:1440] Out: 1800 [Urine:1800]   Physical Exam:  Cardiovascular: RRR, no murmur, sharp valve click x 2 Pulmonary: Clear to auscultation bilaterally Abdomen: Soft, non tender, bowel sounds present. Extremities: No lower extremity edema. Wounds: Clean and dry.  No erythema or signs of infection.  Lab Results: CBC: Recent Labs    04/06/17 1551 04/07/17 0431  WBC  --  10.7*  HGB 7.5* 8.5*  HCT 22.0* 25.2*  PLT  --  133*   BMET:  Recent Labs    04/06/17 1551 04/07/17 0431  NA 133* 133*  K 3.6 3.9  CL 96* 98*  CO2  --  26  GLUCOSE 111* 112*  BUN 14 11  CREATININE 0.80 0.77  CALCIUM  --  8.5*    PT/INR:  Lab Results  Component Value Date   INR 1.60 04/09/2017   INR 1.51 04/08/2017   INR 1.19 04/07/2017   ABG:  INR: Will add last result for INR, ABG once components are confirmed Will add last 4 CBG results once components are confirmed  Assessment/Plan:  1. CV - SR. SBP in the 90's. On Lopressor 12.5 mg bid and Coumadin (mechanical valves). INR 1.6 this am. Will give 7.5 mg of  Coumadin tonight. 2.  Pulmonary - On room air. Encourage incentive spirometer. 3.  Acute blood loss anemia - Last H and H 8.5 and 25.2. Continue Trinsicon. 4. Mild thrombocytopenia-last platelet count 133,000 5. Will likely discharge when INR closer to 2  Donielle M ZimmermanPA-C 04/09/2017,7:12 AM   Chart reviewed, patient examined, agree with above. INR did not change much from yesterday and only 1.60. Will give 7.5 mg Coumadin tonight. She needs to be at least 2 prior to discharge with double mechanical valves. Goal is 2.5-3.5.

## 2017-04-09 NOTE — Progress Notes (Signed)
CARDIAC REHAB PHASE I   PRE:  Rate/Rhythm: 88 SR  BP:  Supine: 106/74  Sitting:   Standing:    SaO2: 98%RA  MODE:  Ambulation: 800 ft   POST:  Rate/Rhythm: 96 SR  BP:  Supine: 114/76  Sitting:   Standing:    SaO2: 96%RA 1118-1138 Pt had already walked but willing to walk again. Pt walked 800 ft with steady gait. Tolerated well. Remained in NSR.   Luetta Nuttingharlene Ibrahem Volkman, RN BSN  04/09/2017 11:35 AM

## 2017-04-10 LAB — PROTIME-INR
INR: 1.75
Prothrombin Time: 20.2 seconds — ABNORMAL HIGH (ref 11.4–15.2)

## 2017-04-10 MED ORDER — FERROUS SULFATE 325 (65 FE) MG PO TABS: 325.0000 mg | ORAL_TABLET | Freq: Every day | ORAL | 3 refills | Status: DC

## 2017-04-10 NOTE — Progress Notes (Signed)
Patient had minor nose bleed after walking the hallway.  Stopped with nose pinch after a few minutes. Will continue to monitor.

## 2017-04-10 NOTE — Progress Notes (Signed)
      [address]       [place] [id number]             [phone]        6 Days Post-Op Procedure(s) (LRB): AORTIC VALVE REPLACEMENT (AVR) (N/A) MITRAL VALVE (MV) REPLACEMENT (N/A) MAZE (N/A) TRANSESOPHAGEAL ECHOCARDIOGRAM (TEE) (N/A)  Subjective: Patient without specific complaints this am.   Objective: Vital signs in last 24 hours: Temp:  [98.3 F (36.8 C)-98.9 F (37.2 C)] 98.5 F (36.9 C) (02/06 0341) Pulse Rate:  [83-88] 84 (02/05 2215) Cardiac Rhythm: Normal sinus rhythm (02/05 1900) Resp:  [18-30] 30 (02/05 1819) BP: (94-119)/(53-75) 109/56 (02/06 0341) SpO2:  [96 %-100 %] 97 % (02/05 1744) Weight:  [122 lb 8 oz (55.6 kg)] 122 lb 8 oz (55.6 kg) (02/06 0336)  Pre op weight 57.7 kg Current Weight  04/10/17 122 lb 8 oz (55.6 kg)      Intake/Output from previous day: 02/05 0701 - 02/06 0700 In: 960 [P.O.:960] Out: -    Physical Exam:  Cardiovascular: RRR, no murmur, sharp valve click x 2 Pulmonary: Clear to auscultation bilaterally Abdomen: Soft, non tender, bowel sounds present. Extremities: No lower extremity edema. Wounds: Clean and dry.  No erythema or signs of infection.  Lab Results: CBC: No results for input(s): WBC, HGB, HCT, PLT in the last 72 hours. BMET:  No results for input(s): NA, K, CL, CO2, GLUCOSE, BUN, CREATININE, CALCIUM in the last 72 hours.  PT/INR:  Lab Results  Component Value Date   INR 1.75 04/10/2017   INR 1.60 04/09/2017   INR 1.51 04/08/2017   ABG:  INR: Will add last result for INR, ABG once components are confirmed Will add last 4 CBG results once components are confirmed  Assessment/Plan:  1. CV - SR. SBP in the 90's. On Lopressor 12.5 mg bid and Coumadin (mechanical valves). INR 1.75 this am. Was given 7.5 of Coumadin last night so will see result in am. 2.  Pulmonary - On room air. Encourage incentive spirometer. 3.  Acute blood loss anemia - Last H and H 8.5 and 25.2. Continue  Trinsicon. 4. Mild thrombocytopenia-last platelet count 133,000 5. Will discharge when INR closer to 2 as has 2 mechanical valves.   Amiria Orrison M ZimmermanPA-C 04/10/2017,7:20 AM

## 2017-04-10 NOTE — Progress Notes (Signed)
Pt just washed her hair in the sink and is resting now. Sts she was cognizant of Move in the Tube when washing her hair. She has been walking independently 800 ft at a time and will walk later after she rests. Discussed IS, sternal precautions, Vit K, ex gl, and CRPII. [facility] to [facility]. Also discussed smoking cessation. She sts she is quit. Has previous quitting experience and is confident this time. She declined fake cigarette. Gave her resources.  [id number] Ethelda ChickKristan Elmina Hendel CES, ACSM 10:23 AM 04/10/2017

## 2017-04-11 LAB — PROTIME-INR
INR: 2.17
Prothrombin Time: 24 seconds — ABNORMAL HIGH (ref 11.4–15.2)

## 2017-04-11 MED ORDER — WARFARIN SODIUM 5 MG PO TABS: 5.0000 mg | ORAL_TABLET | Freq: Every day | ORAL | 1 refills | Status: DC

## 2017-04-11 NOTE — Progress Notes (Addendum)
      [address]       [place] [id number]             [phone]        7 Days Post-Op Procedure(s) (LRB): AORTIC VALVE REPLACEMENT (AVR) (N/A) MITRAL VALVE (MV) REPLACEMENT (N/A) MAZE (N/A) TRANSESOPHAGEAL ECHOCARDIOGRAM (TEE) (N/A)  Subjective: Patient did have a small nose bleed last evening. She is without specific complaints this am.   Objective: Vital signs in last 24 hours: Temp:  [98.7 F (37.1 C)] 98.7 F (37.1 C) (02/06 1959) Pulse Rate:  [80-84] 80 (02/06 2122) Cardiac Rhythm: Normal sinus rhythm (02/06 1900) Resp:  [20-26] 20 (02/06 1959) BP: (104-113)/(56-68) 113/63 (02/06 2122) SpO2:  [95 %] 95 % (02/06 1959) Weight:  [122 lb 1.6 oz (55.4 kg)] 122 lb 1.6 oz (55.4 kg) (02/07 0326)  Pre op weight 57.7 kg Current Weight  04/11/17 122 lb 1.6 oz (55.4 kg)      Intake/Output from previous day: 02/06 0701 - 02/07 0700 In: 125 [P.O.:125] Out: -    Physical Exam:  Cardiovascular: RRR, no murmur, sharp valve click x 2 Pulmonary: Clear to auscultation bilaterally Abdomen: Soft, non tender, bowel sounds present. Extremities: No lower extremity edema. Wounds: Clean and dry.  No erythema or signs of infection.  Lab Results: CBC: No results for input(s): WBC, HGB, HCT, PLT in the last 72 hours. BMET:  No results for input(s): NA, K, CL, CO2, GLUCOSE, BUN, CREATININE, CALCIUM in the last 72 hours.  PT/INR:  Lab Results  Component Value Date   INR 2.17 04/11/2017   INR 1.75 04/10/2017   INR 1.60 04/09/2017   ABG:  INR: Will add last result for INR, ABG once components are confirmed Will add last 4 CBG results once components are confirmed  Assessment/Plan:  1. CV - SR. SBP in the 90's. On Lopressor 12.5 mg bid and Coumadin (mechanical valves). INR increased to 2.17 this am. Was given 7.5 of Coumadin last 2 nights so will discharge on 5 mg of Coumadin. 2.  Pulmonary - On room air. Encourage incentive spirometer. 3.  Acute blood  loss anemia - Last H and H 8.5 and 25.2. Continue Trinsicon. 4. Mild thrombocytopenia-last platelet count 133,000 5. Chest tube sutures to remain as will be removed in the office 6. Will discharge later today   Kesha Hurrell M ZimmermanPA-C 04/11/2017,7:10 AM

## 2017-04-11 NOTE — Care Management Note (Signed)
Case Management Note Original Note Created [name], RN--04/01/2017, 10:58 AM   Patient Details  Name: Sharon Figueroa MRN: [id number] Date of Birth: 1960/12/22  Subjective/Objective:  Pt presented for Atrial Fib- Plan for home on Eliquis- Benefits Check in process.                 Action/Plan: CM will make pt aware of cost once completed. No further needs from CM at this time.   Expected Discharge Date:   04/11/17             Expected Discharge Plan:  Home/Self Care  In-House Referral:  NA  Discharge planning Services  CM Consult  Post Acute Care Choice:  NA Choice offered to:  NA  DME Arranged:    DME Agency:  NA  HH Arranged:  NA HH Agency:  NA  Status of Service:  Completed, signed off  If discussed at [place] [facility] of Stay Meetings, dates discussed:    Additional Comments:  04/11/17- 1200- Adam Sanjuan RN, CM-- pt s/p MVR/AVR and MAZE- stable for transition home- Eliquis has been stopped and pt now on coumadin. No further CM needs noted.   2979 04-02-17 Jacqlyn Krauss, RN,BSN [phone] Post Cath-04-01-17. Plan for MVR/AVR/Maze 04-04-17. Plan IV Heparin gtt. Will not need Eliquis post procedure- plan most likely coumadin at d/c. No further needs from CM at this time.    1451 04-01-17 Jacqlyn Krauss, RN, BSN [phone] S/W [name] @ [place] # [phone]   ELIQUIS 5 MG BID  COVER- YES  CO-PAY- $ 70.00  PRIOR APPROVAL- YES # [phone]   DEDUCTIBLE: NOT MET / $ 2,500.00   PREFERRED PHARMACY : [facility]    [name] [name], RN 04/11/2017, 12:11 PM [phone] 4E Case management

## 2017-04-11 NOTE — Progress Notes (Signed)
Order received to discharge patients.  Telemetry monitor removed and CCMD notified.  Discharge instructions, follow up, medications and instructions for follow up discussed with patient.

## 2017-04-12 ENCOUNTER — Other Ambulatory Visit: Payer: Self-pay

## 2017-04-12 ENCOUNTER — Ambulatory Visit (INDEPENDENT_AMBULATORY_CARE_PROVIDER_SITE_OTHER): Payer: Managed Care, Other (non HMO) | Admitting: *Deleted

## 2017-04-12 DIAGNOSIS — I4892 Unspecified atrial flutter: Secondary | ICD-10-CM

## 2017-04-12 DIAGNOSIS — D6489 Other specified anemias: Secondary | ICD-10-CM

## 2017-04-12 DIAGNOSIS — Z952 Presence of prosthetic heart valve: Secondary | ICD-10-CM

## 2017-04-12 DIAGNOSIS — I480 Paroxysmal atrial fibrillation: Secondary | ICD-10-CM

## 2017-04-12 DIAGNOSIS — I050 Rheumatic mitral stenosis: Secondary | ICD-10-CM

## 2017-04-12 DIAGNOSIS — Z5181 Encounter for therapeutic drug level monitoring: Secondary | ICD-10-CM | POA: Insufficient documentation

## 2017-04-12 LAB — POCT INR: INR: 4.1

## 2017-04-12 MED ORDER — FERROUS SULFATE 325 (65 FE) MG PO TABS: 325.0000 mg | ORAL_TABLET | Freq: Every day | ORAL | 0 refills | Status: DC

## 2017-04-12 NOTE — Patient Instructions (Addendum)
A full discussion of the nature of anticoagulants has been carried out.  A benefit risk analysis has been presented to the patient, so that they understand the justification for choosing anticoagulation at this time. The need for frequent and regular monitoring, precise dosage adjustment and compliance is stressed.  Side effects of potential bleeding are discussed.  The patient should avoid any OTC items containing aspirin or ibuprofen, and should avoid great swings in general diet.  Avoid alcohol consumption.  Call if any signs of abnormal bleeding.    Description   Do not take any Coumadin today then start taking 5mg  daily except 2.5mg  on Tuesdays, Thursdays, and Saturdays. Recheck INR in 1 week.  [facility] [phone] [facility]#[phone]

## 2017-04-12 NOTE — Telephone Encounter (Signed)
RX for Ferrous sulfate 325 mg po daily [facility] [place].

## 2017-04-16 ENCOUNTER — Ambulatory Visit (INDEPENDENT_AMBULATORY_CARE_PROVIDER_SITE_OTHER): Payer: Self-pay

## 2017-04-16 DIAGNOSIS — Z4802 Encounter for removal of sutures: Secondary | ICD-10-CM

## 2017-04-16 NOTE — Progress Notes (Signed)
Removed 3 sutures from chest tube site with no signs of infection and patient tolerated well 

## 2017-04-19 ENCOUNTER — Ambulatory Visit (INDEPENDENT_AMBULATORY_CARE_PROVIDER_SITE_OTHER): Payer: Managed Care, Other (non HMO) | Admitting: Pharmacist

## 2017-04-19 DIAGNOSIS — Z952 Presence of prosthetic heart valve: Secondary | ICD-10-CM

## 2017-04-19 DIAGNOSIS — I4892 Unspecified atrial flutter: Secondary | ICD-10-CM | POA: Diagnosis not present

## 2017-04-19 DIAGNOSIS — I480 Paroxysmal atrial fibrillation: Secondary | ICD-10-CM | POA: Diagnosis not present

## 2017-04-19 DIAGNOSIS — I050 Rheumatic mitral stenosis: Secondary | ICD-10-CM | POA: Diagnosis not present

## 2017-04-19 DIAGNOSIS — Z5181 Encounter for therapeutic drug level monitoring: Secondary | ICD-10-CM

## 2017-04-19 LAB — POCT INR: INR: 3

## 2017-04-19 NOTE — Patient Instructions (Addendum)
Use saline solution or AYR (saline ointment) to keep nares moist .  May use AFRIN nasal spray to decrease nose bleeds

## 2017-04-27 NOTE — Progress Notes (Signed)
Cardiology Office Note   Date:  04/29/2017   ID:  Sharon MinerLinda Hawkes, DOB 09/21/1960, MRN [id number]  PCP:  Patient, No Pcp Per  Cardiologist: Dr. Jens Somrenshaw Chief Complaint  Patient presents with  . Hospitalization Follow-up  . Cardiac Valve Problem     History of Present Illness: Sharon MinerLinda Figueroa is a 57 y.o. female who presents for postoperative follow-up after admission in the setting of severe mitral valve stenosis, moderate aortic insufficiency, with history of rheumatic heart disease, diabetes and atrial fibrillation.  The patient underwent MVR, mitral valve replacement), and aortic valve replacement, with bilateral MAZE  procedure.  The mitral valve was replaced with a 23 mm CarboMedics mechanical Supra-Annuar Tophat valve.  Mitral valve was replaced with a 27 mm CarboMedics OptiForm mechanical valve.  Patient also went ligation of the left atrial appendage, all on 04/04/2017.The patient was initiated on Coumadin therapy.  She comes today without any further complaints. She states that she did not have any history of shortness of breath or chest pain, she was found to have rapid atrial fibrillation which led her to hospitalization which required above-mentioned surgical repair of mitral and aortic valves. She is continued to feel better each day, has not yet been released from her surgeon to undergo cardiac rehabilitation. She is medically compliant. She has been seen by PharmD today for Coumadin dosing instructions.  Past Medical History:  Diagnosis Date  . 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 AORTIC VALVE REPLACEMENT N/A 04/04/2017   Procedure: AORTIC VALVE REPLACEMENT (AVR);  Surgeon: Alleen BorneBartle, Bryan K, MD;  [facility];  Service: Open Heart Surgery;  Laterality: N/A;  . MAZE N/A 04/04/2017   Procedure: MAZE;  Surgeon: Alleen BorneBartle, Bryan K, MD;  Location: [facility] OR;  Service: Open Heart Surgery;  Laterality: N/A;  .  MITRAL VALVE REPLACEMENT N/A 04/04/2017   Procedure: MITRAL VALVE (MV) REPLACEMENT;  Surgeon: Alleen BorneBartle, Bryan K, MD;  Location: [facility] OR;  Service: Open Heart Surgery;  Laterality: N/A;  . No prior surgery    . RIGHT/LEFT HEART CATH AND CORONARY ANGIOGRAPHY N/A 04/01/2017   Procedure: RIGHT/LEFT HEART CATH AND CORONARY ANGIOGRAPHY;  Surgeon: Runell GessBerry, Jonathan J, MD;  Location: [facility] INVASIVE CV LAB;  Service: Cardiovascular;  Laterality: N/A;  . TEE WITHOUT CARDIOVERSION N/A 04/04/2017   Procedure: TRANSESOPHAGEAL ECHOCARDIOGRAM (TEE);  Surgeon: Alleen BorneBartle, Bryan K, MD;  [facility];  Service: Open Heart Surgery;  Laterality: N/A;     Current Outpatient Medications  Medication Sig Dispense Refill  . acetaminophen (TYLENOL) 325 MG tablet Take 2 tablets (650 mg total) by mouth every 6 (six) hours as needed for mild pain.    . ferrous sulfate 325 (65 FE) MG tablet Take 1 tablet (325 mg total) by mouth daily. For one month then stop. 30 tablet 0  . metoprolol tartrate (LOPRESSOR) 25 MG tablet Take 0.5 tablets (12.5 mg total) by mouth 2 (two) times daily. 30 tablet 1  . warfarin (COUMADIN) 5 MG tablet Take 1 tablet (5 mg total) by mouth daily at 6 PM. Or as directed. 30 tablet 1  . traMADol (ULTRAM) 50 MG tablet Take 1 tablet (50 mg total) by mouth every 4 (four) hours as needed for moderate pain. (Patient not taking: Reported on 04/29/2017) 30 tablet 0   No current facility-administered medications for this visit.     Allergies:   Patient has no known allergies.  Social History:  The patient  reports that she has been smoking cigarettes.  She has been smoking about 1.00 pack per day. she has never used smokeless tobacco. She reports that she does not drink alcohol or use drugs.   Family History:  The patient's family history includes CVA in her father; Heart attack in her father.    ROS: All other systems are reviewed and negative. Unless otherwise mentioned in H&P    PHYSICAL EXAM: VS:  BP 116/66    Ht 5\' 6"  (1.676 m)   Wt 124 lb 6.4 oz (56.4 kg)   BMI 20.08 kg/m  , BMI Body mass index is 20.08 kg/m. GEN: Well nourished, well developed, in no acute distress  HEENT: normal  Neck: no JVD, carotid bruits, or masses Cardiac: RRR; 2/6 systolic murmurs, rubs, or gallops,no edema  Respiratory:  clear to auscultation bilaterally, normal work of breathing GI: soft, nontender, nondistended, + BS MS: no deformity or atrophy. Sternotomy site is well-healed, there is some distal bruising at the most distal edge of the sternotomy. She states that this is sore. Skin: warm and dry, no rash Neuro:  Strength and sensation are intact Psych: euthymic mood, full affect   Recent Labs: 03/30/2017: TSH 3.800 04/05/2017: Magnesium 2.8 04/07/2017: BUN 11; Creatinine, Ser 0.77; Hemoglobin 8.5; Platelets 133; Potassium 3.9; Sodium 133    Lipid Panel No results found for: CHOL, TRIG, HDL, CHOLHDL, VLDL, LDLCALC, LDLDIRECT    Wt Readings from Last 3 Encounters:  04/29/17 124 lb 6.4 oz (56.4 kg)  04/11/17 122 lb 1.6 oz (55.4 kg)      Other studies Reviewed:  RHC/LHC 04/01/2017  IMPRESSION: Ms Crumbley has normal coronary arteries, minimal aortic valve gradient on pullback and a 22 mm transmitral pressure gradient on simultaneous LV and wedge pressure tracings. Agree aortic and mitral valve replacement and surgical MA ZE procedure for atrial fibrillation. A right femoral angina was performed and a MYNX closure device was successfully deployed achieving hemostasis. The patient left the lab in stable condition.  ASSESSMENT AND PLAN:  1.  Rheumatic Valvular heart disease : Status post mitral valve and aortic valve repair/replacement with left atrial appendage ligation. The patient is doing well from a cardiac standpoint, has multiple questions concerning medications that she is allowed to take for allergies (pollen). I've advised proceeding H BP, generic Zyrtec or Claritin,Ayr saline gel to assist with nasal  dryness, and Flonase. All of these are safe to take with valvular heart disease without any contraindications.   Recommend cardiac rehabilitation when released by cardiothoracic surgeon.  2. Paroxysmal atrial fibrillation: She has had no further episodes of atrial fibrillation, now thought to be related to valvular heart disease. She continues on Coumadin therapy for mechanical aortic valve and CVA prophylaxis.  3.  Mild thrombocytopenia with acute blood loss anemia: Follow-up labs are planned by cardiothoracic surgery. Most recent hemoglobin 8.5, hematocrit 25.2, platelets 133 (dated 04/07/2017).   Current medicines are reviewed at length with the patient today.    Labs/ tests ordered today include: None Bettey Mare. Liborio Nixon, ANP, AACC   04/29/2017 12:25 PM    [facility] [address], [place], [place] [address] Phone: [phone]; Fax: [phone]

## 2017-04-29 ENCOUNTER — Ambulatory Visit (INDEPENDENT_AMBULATORY_CARE_PROVIDER_SITE_OTHER): Payer: Managed Care, Other (non HMO) | Admitting: Pharmacist Clinician (PhC)/ Clinical Pharmacy Specialist

## 2017-04-29 ENCOUNTER — Encounter: Payer: Self-pay | Admitting: Adult Health

## 2017-04-29 ENCOUNTER — Ambulatory Visit (INDEPENDENT_AMBULATORY_CARE_PROVIDER_SITE_OTHER): Payer: Managed Care, Other (non HMO) | Admitting: Adult Health

## 2017-04-29 VITALS — BP 116/66 | Ht 66.0 in | Wt 124.4 lb

## 2017-04-29 DIAGNOSIS — Z5181 Encounter for therapeutic drug level monitoring: Secondary | ICD-10-CM | POA: Diagnosis not present

## 2017-04-29 DIAGNOSIS — I4892 Unspecified atrial flutter: Secondary | ICD-10-CM

## 2017-04-29 DIAGNOSIS — I480 Paroxysmal atrial fibrillation: Secondary | ICD-10-CM

## 2017-04-29 DIAGNOSIS — D649 Anemia, unspecified: Secondary | ICD-10-CM

## 2017-04-29 DIAGNOSIS — I050 Rheumatic mitral stenosis: Secondary | ICD-10-CM

## 2017-04-29 DIAGNOSIS — Z952 Presence of prosthetic heart valve: Secondary | ICD-10-CM

## 2017-04-29 LAB — POCT INR: INR: 2.3

## 2017-04-29 MED ORDER — NITROGLYCERIN 0.4 MG SL SUBL: 0.4000 mg | SUBLINGUAL_TABLET | SUBLINGUAL | 3 refills | Status: DC | PRN

## 2017-04-29 MED ORDER — ALPRAZOLAM 0.25 MG PO TABS: 0.2500 mg | ORAL_TABLET | Freq: Three times a day (TID) | ORAL | 0 refills | Status: DC | PRN

## 2017-04-29 NOTE — Patient Instructions (Addendum)
Medication Instructions:  NO CHANGES-Your physician recommends that you continue on your current medications as directed. Please refer to the Current Medication list given to you today.  If you need a refill on your cardiac medications before your next appointment, please call your pharmacy.  Testing/Procedures: REFERRAL TO CARDIAC REHAB  Follow-Up: Your physician wants you to follow-up in: 3 MONTHS WITH DR Jens SomRENSHAW.  Thank you for choosing [facility]!!

## 2017-04-29 NOTE — Patient Instructions (Signed)
Description   Take 1.5 tablets today Monday Feb 25, then increase dose to 1 tablet daily except 1/2 tablet each Tuesday and Saturday. Recheck INR in 1 week. *[facility] phone number #[phone]*

## 2017-05-01 ENCOUNTER — Telehealth (HOSPITAL_COMMUNITY): Payer: Self-pay

## 2017-05-01 NOTE — Telephone Encounter (Signed)
Sp[facility] with Jasmine from [facility] and they do have referral for patient to attend CR there. Closed referral.

## 2017-05-07 ENCOUNTER — Ambulatory Visit (INDEPENDENT_AMBULATORY_CARE_PROVIDER_SITE_OTHER): Payer: Managed Care, Other (non HMO) | Admitting: Pharmacist

## 2017-05-07 ENCOUNTER — Other Ambulatory Visit: Payer: Self-pay | Admitting: Surgery

## 2017-05-07 DIAGNOSIS — Z952 Presence of prosthetic heart valve: Secondary | ICD-10-CM

## 2017-05-07 DIAGNOSIS — I050 Rheumatic mitral stenosis: Secondary | ICD-10-CM | POA: Diagnosis not present

## 2017-05-07 DIAGNOSIS — Z5181 Encounter for therapeutic drug level monitoring: Secondary | ICD-10-CM | POA: Diagnosis not present

## 2017-05-07 DIAGNOSIS — I480 Paroxysmal atrial fibrillation: Secondary | ICD-10-CM | POA: Diagnosis not present

## 2017-05-07 DIAGNOSIS — I4892 Unspecified atrial flutter: Secondary | ICD-10-CM | POA: Diagnosis not present

## 2017-05-07 LAB — POCT INR: INR: 3.2

## 2017-05-08 ENCOUNTER — Other Ambulatory Visit: Payer: Self-pay

## 2017-05-08 ENCOUNTER — Ambulatory Visit
Admission: RE | Admit: 2017-05-08 | Discharge: 2017-05-08 | Disposition: A | Discharge status: Home / Self Care | Payer: Managed Care, Other (non HMO) | Source: Ambulatory Visit | Attending: Surgery | Admitting: Surgery

## 2017-05-08 ENCOUNTER — Encounter: Payer: Self-pay | Admitting: Surgery

## 2017-05-08 ENCOUNTER — Ambulatory Visit (INDEPENDENT_AMBULATORY_CARE_PROVIDER_SITE_OTHER): Payer: Self-pay | Admitting: Surgery

## 2017-05-08 VITALS — BP 90/60 | HR 74 | Resp 18 | Ht 66.0 in | Wt 126.2 lb

## 2017-05-08 DIAGNOSIS — Z8679 Personal history of other diseases of the circulatory system: Secondary | ICD-10-CM

## 2017-05-08 DIAGNOSIS — Z9889 Other specified postprocedural states: Secondary | ICD-10-CM

## 2017-05-08 DIAGNOSIS — Z952 Presence of prosthetic heart valve: Secondary | ICD-10-CM

## 2017-05-08 NOTE — Progress Notes (Signed)
HPI: Patient returns for routine postoperative follow-up having undergone aortic valve replacement and mitral valve replacement using mechanical valves and biatrial maze procedure with ligation of left atrial appendage on 04/04/2017.. The patient's early postoperative recovery while in the hospital was notable for an uncomplicated postoperative course.  She was started on Coumadin and discharged after therapeutic. Since hospital discharge the patient reports that she has been feeling well.  She is walking 10 minutes at a time several times per day without chest pain or shortness of breath.  Her stamina is not back to normal but improving.  She has gone to the grocery store a few times and is fairly tired but at times gets back home again.  Her INR has been followed in the last one on 05/07/2017 was 3.2.  She saw Bailey MechKatherine Lawrence, NP on 04/29/2017.   Current Outpatient Medications  Medication Sig Dispense Refill  . acetaminophen (TYLENOL) 325 MG tablet Take 2 tablets (650 mg total) by mouth every 6 (six) hours as needed for mild pain.    . ferrous sulfate 325 (65 FE) MG tablet Take 1 tablet (325 mg total) by mouth daily. For one month then stop. 30 tablet 0  . metoprolol tartrate (LOPRESSOR) 25 MG tablet Take 0.5 tablets (12.5 mg total) by mouth 2 (two) times daily. 30 tablet 1  . traMADol (ULTRAM) 50 MG tablet Take 1 tablet (50 mg total) by mouth every 4 (four) hours as needed for moderate pain. 30 tablet 0  . warfarin (COUMADIN) 5 MG tablet Take 1 tablet (5 mg total) by mouth daily at 6 PM. Or as directed. 30 tablet 1   No current facility-administered medications for this visit.     Physical Exam: BP 90/60 (BP Location: Left Arm, Patient Position: Sitting, Cuff Size: Normal)   Pulse 74   Resp 18   Ht 5\' 6"  (1.676 m)   Wt 126 lb 3.2 oz (57.2 kg)   SpO2 97% Comment: RA  BMI 20.37 kg/m  She looks well. Cardiac exam shows a regular rate and rhythm with crisp mechanical valve clicks.   There is no murmur. Lungs are clear. The chest incision is healing well and sternum is stable. There is no peripheral edema.  Diagnostic Tests:  CLINICAL DATA:  Status post aortic valve replacement.  EXAM: CHEST - 2 VIEW  COMPARISON:  Radiographs of April 08, 2017.  FINDINGS: The heart size and mediastinal contours are within normal limits. Status post aortic and mitral valve repair. No pneumothorax or pleural effusion is noted. Both lungs are clear. The visualized skeletal structures are unremarkable.  IMPRESSION: No active cardiopulmonary disease.   Electronically Signed   By: Lupita RaiderJames  Green Jr, M.D.   On: 05/08/2017 09:27   Impression:  Overall I think she is making excellent recovery from her surgery.  Her blood pressure is on the low side today at 90/60 with a pulse of 74 although she is asymptomatic.  Her blood pressure on 04/29/2017 at the cardiology office was 116/66.  She is on low-dose Lopressor which was started postoperatively to try to avoid recurrent atrial fibrillation.  She has had no recurrent atrial arrhythmias after Maze procedure and if her blood pressure remains on the low side I think she could be taken off of Lopressor.  I encouraged her to continue ambulating as much as possible and told her that she can increase the amount of time that she is walking as long as she feels well.  I asked  her not to lift anything heavier than 10 pounds for 3 months postoperatively.  I told her she could return to driving a car.  Her INR is therapeutic and she will continue to be followed in the anticoagulation clinic.  She asked about going back to work and I told her that I do not think she would be able to return to work full-time for 3 months postoperatively because it is going to take time to get her stamina back to normal.  She is considering cardiac rehabilitation but may decide to do an exercise program on her own at home.  Plan:  I will plan to see her back in 1  month for follow-up.  She is going to see Dr. Jens Som in May 2019.   Alleen Borne, MD [facility] [phone]

## 2017-05-17 ENCOUNTER — Ambulatory Visit (INDEPENDENT_AMBULATORY_CARE_PROVIDER_SITE_OTHER): Payer: Managed Care, Other (non HMO) | Admitting: Pharmacist Clinician (PhC)/ Clinical Pharmacy Specialist

## 2017-05-17 DIAGNOSIS — I480 Paroxysmal atrial fibrillation: Secondary | ICD-10-CM | POA: Diagnosis not present

## 2017-05-17 DIAGNOSIS — Z952 Presence of prosthetic heart valve: Secondary | ICD-10-CM

## 2017-05-17 DIAGNOSIS — I050 Rheumatic mitral stenosis: Secondary | ICD-10-CM

## 2017-05-17 DIAGNOSIS — Z5181 Encounter for therapeutic drug level monitoring: Secondary | ICD-10-CM

## 2017-05-17 DIAGNOSIS — I4892 Unspecified atrial flutter: Secondary | ICD-10-CM | POA: Diagnosis not present

## 2017-05-17 LAB — POCT INR: INR: 3

## 2017-05-31 ENCOUNTER — Ambulatory Visit (INDEPENDENT_AMBULATORY_CARE_PROVIDER_SITE_OTHER): Payer: Managed Care, Other (non HMO) | Admitting: Pharmacist

## 2017-05-31 DIAGNOSIS — I480 Paroxysmal atrial fibrillation: Secondary | ICD-10-CM | POA: Diagnosis not present

## 2017-05-31 DIAGNOSIS — I4892 Unspecified atrial flutter: Secondary | ICD-10-CM

## 2017-05-31 DIAGNOSIS — Z5181 Encounter for therapeutic drug level monitoring: Secondary | ICD-10-CM

## 2017-05-31 DIAGNOSIS — I050 Rheumatic mitral stenosis: Secondary | ICD-10-CM

## 2017-05-31 DIAGNOSIS — Z952 Presence of prosthetic heart valve: Secondary | ICD-10-CM

## 2017-05-31 LAB — POCT INR: INR: 1.7

## 2017-05-31 NOTE — Patient Instructions (Signed)
Take an extra half tablet of warfarin today 05/31/17. Then continue 1 tablet daily except 1/2 tablet each Tuesday and Saturday. Recheck INR in 2 weeks.

## 2017-06-04 ENCOUNTER — Other Ambulatory Visit: Payer: Self-pay | Admitting: Physician Assistant

## 2017-06-09 ENCOUNTER — Other Ambulatory Visit: Payer: Self-pay | Admitting: Physician Assistant

## 2017-06-11 ENCOUNTER — Other Ambulatory Visit: Payer: Self-pay | Admitting: Pharmacist Clinician (PhC)/ Clinical Pharmacy Specialist

## 2017-06-11 ENCOUNTER — Telehealth: Payer: Self-pay | Admitting: Cardiology

## 2017-06-11 MED ORDER — WARFARIN SODIUM 5 MG PO TABS: ORAL_TABLET | ORAL | 3 refills | Status: DC

## 2017-06-11 MED ORDER — METOPROLOL TARTRATE 25 MG PO TABS: 12.5000 mg | ORAL_TABLET | Freq: Two times a day (BID) | ORAL | 2 refills | Status: DC

## 2017-06-11 NOTE — Telephone Encounter (Signed)
New message     *STAT* If patient is at the pharmacy, call can be transferred to refill team.   1. Which medications need to be refilled? (please list name of each medication and dose if known) metoprolol tartrate (LOPRESSOR) 25 MG tablet and warfarin (COUMADIN) 5 MG tablet  2. Which pharmacy/location (including street and city if local pharmacy) is medication to be sent to?[id number] [place] [place] - [address]  3. Do they need a 30 day or 90 day supply? 30

## 2017-06-12 ENCOUNTER — Ambulatory Visit (INDEPENDENT_AMBULATORY_CARE_PROVIDER_SITE_OTHER): Payer: Self-pay | Admitting: Surgery

## 2017-06-12 ENCOUNTER — Encounter: Payer: Self-pay | Admitting: Surgery

## 2017-06-12 VITALS — BP 108/70 | HR 72 | Resp 20 | Ht 66.0 in | Wt 130.0 lb

## 2017-06-12 DIAGNOSIS — Z8679 Personal history of other diseases of the circulatory system: Secondary | ICD-10-CM

## 2017-06-12 DIAGNOSIS — Z952 Presence of prosthetic heart valve: Secondary | ICD-10-CM

## 2017-06-12 DIAGNOSIS — Z9889 Other specified postprocedural states: Secondary | ICD-10-CM

## 2017-06-12 NOTE — Progress Notes (Signed)
     HPI: Patient returns for routine postoperative follow-up having undergone aortic valve replacement with a 23 mm CarboMedics Tophat mechanical valve and mitral valve replacement with a 27 mm CarboMedics OptiForm mechanical valve with a biatrial maze procedure on 04/04/2017. The patient's early postoperative recovery while in the hospital was notable for an uncomplicated postoperative course.  Since I last saw her on 05/08/2017 she has been progressing well.  She is walking couple miles per day without chest pain or shortness of breath.  She feels like her stamina is back to normal.  She has already returned to work part-time and is planning to return full-time within the next 2 weeks.  She has been maintaining a regular rhythm and has had noted no palpitations.  Her INR is being followed in the Coumadin clinic.  She had 2 values on 05/07/2017 and 05/17/2017 that was stable at 3.2 and 3.0 respectively.  Her last check on 05/31/2017 showed an INR of 1.7.  She said that she had eaten turnip greens twice that week.  She was told in the Coumadin clinic to take an extra 2.5 mg that day and to take an extra 2.5 mg anytime she was eating green leafy vegetables to compensate.  She is scheduled for a follow-up INR later this week.  She is currently on 5 mg daily except for 2.5 mg on Tuesday and Saturday.   Current Outpatient Medications  Medication Sig Dispense Refill  . acetaminophen (TYLENOL) 325 MG tablet Take 2 tablets (650 mg total) by mouth every 6 (six) hours as needed for mild pain.    . metoprolol tartrate (LOPRESSOR) 25 MG tablet Take 0.5 tablets (12.5 mg total) by mouth 2 (two) times daily. 30 tablet 2  . warfarin (COUMADIN) 5 MG tablet Take 1 tablet by mouth daily or as directed by coumadin clinic 30 tablet 3   No current facility-administered medications for this visit.     Physical Exam: BP 108/70   Pulse 72   Resp 20   Ht 5\' 6"  (1.676 m)   Wt 130 lb (59 kg)   SpO2 99% Comment: RA  BMI  20.98 kg/m  She looks well. Cardiac exam shows a regular rate and rhythm with crisp mechanical valve clicks.  There is no murmur. Lungs are clear. Chest incision is healing well and the sternum is stable. There is no peripheral edema.   Impression:  She continues to make an excellent recovery following her surgery.  I told her that she can return to full activity at 3 months postoperatively.  She will have her INR checked again later this week in the Coumadin clinic.  Ideally she should maintain an INR around 3.0 with 2 mechanical valves in place.  Plan:  She will continue to follow-up with Dr. Olga MillersBrian Crenshaw and will contact me if she develops any problems with her incision.   Alleen BorneBryan K Lashawn Orrego, MD [facility] [phone]

## 2017-06-14 ENCOUNTER — Ambulatory Visit (INDEPENDENT_AMBULATORY_CARE_PROVIDER_SITE_OTHER): Payer: Managed Care, Other (non HMO) | Admitting: Pharmacist

## 2017-06-14 DIAGNOSIS — I4892 Unspecified atrial flutter: Secondary | ICD-10-CM

## 2017-06-14 DIAGNOSIS — I480 Paroxysmal atrial fibrillation: Secondary | ICD-10-CM | POA: Diagnosis not present

## 2017-06-14 DIAGNOSIS — Z5181 Encounter for therapeutic drug level monitoring: Secondary | ICD-10-CM

## 2017-06-14 DIAGNOSIS — Z952 Presence of prosthetic heart valve: Secondary | ICD-10-CM

## 2017-06-14 DIAGNOSIS — I050 Rheumatic mitral stenosis: Secondary | ICD-10-CM | POA: Diagnosis not present

## 2017-06-14 LAB — POCT INR: INR: 2

## 2017-06-25 ENCOUNTER — Ambulatory Visit (INDEPENDENT_AMBULATORY_CARE_PROVIDER_SITE_OTHER): Payer: Managed Care, Other (non HMO) | Admitting: Pharmacist

## 2017-06-25 DIAGNOSIS — I050 Rheumatic mitral stenosis: Secondary | ICD-10-CM

## 2017-06-25 DIAGNOSIS — I4892 Unspecified atrial flutter: Secondary | ICD-10-CM | POA: Diagnosis not present

## 2017-06-25 DIAGNOSIS — I480 Paroxysmal atrial fibrillation: Secondary | ICD-10-CM | POA: Diagnosis not present

## 2017-06-25 DIAGNOSIS — Z952 Presence of prosthetic heart valve: Secondary | ICD-10-CM

## 2017-06-25 DIAGNOSIS — Z5181 Encounter for therapeutic drug level monitoring: Secondary | ICD-10-CM | POA: Diagnosis not present

## 2017-06-25 LAB — POCT INR: INR: 2.2

## 2017-06-25 NOTE — Patient Instructions (Signed)
Blood pressure 96/62 Pulse 70

## 2017-07-02 ENCOUNTER — Ambulatory Visit (INDEPENDENT_AMBULATORY_CARE_PROVIDER_SITE_OTHER): Payer: Managed Care, Other (non HMO) | Admitting: Pharmacist

## 2017-07-02 DIAGNOSIS — I050 Rheumatic mitral stenosis: Secondary | ICD-10-CM

## 2017-07-02 DIAGNOSIS — I4892 Unspecified atrial flutter: Secondary | ICD-10-CM | POA: Diagnosis not present

## 2017-07-02 DIAGNOSIS — I480 Paroxysmal atrial fibrillation: Secondary | ICD-10-CM

## 2017-07-02 DIAGNOSIS — Z5181 Encounter for therapeutic drug level monitoring: Secondary | ICD-10-CM | POA: Diagnosis not present

## 2017-07-02 DIAGNOSIS — Z952 Presence of prosthetic heart valve: Secondary | ICD-10-CM

## 2017-07-02 LAB — POCT INR: INR: 2.6

## 2017-07-10 ENCOUNTER — Ambulatory Visit (INDEPENDENT_AMBULATORY_CARE_PROVIDER_SITE_OTHER): Payer: Managed Care, Other (non HMO) | Admitting: Pharmacist

## 2017-07-10 DIAGNOSIS — Z5181 Encounter for therapeutic drug level monitoring: Secondary | ICD-10-CM

## 2017-07-10 DIAGNOSIS — I050 Rheumatic mitral stenosis: Secondary | ICD-10-CM | POA: Diagnosis not present

## 2017-07-10 DIAGNOSIS — I480 Paroxysmal atrial fibrillation: Secondary | ICD-10-CM | POA: Diagnosis not present

## 2017-07-10 DIAGNOSIS — Z952 Presence of prosthetic heart valve: Secondary | ICD-10-CM | POA: Diagnosis not present

## 2017-07-10 DIAGNOSIS — I4892 Unspecified atrial flutter: Secondary | ICD-10-CM | POA: Diagnosis not present

## 2017-07-10 LAB — POCT INR: INR: 4.1

## 2017-07-24 ENCOUNTER — Ambulatory Visit (INDEPENDENT_AMBULATORY_CARE_PROVIDER_SITE_OTHER): Payer: Managed Care, Other (non HMO) | Admitting: Pharmacist

## 2017-07-24 DIAGNOSIS — I050 Rheumatic mitral stenosis: Secondary | ICD-10-CM | POA: Diagnosis not present

## 2017-07-24 DIAGNOSIS — I4892 Unspecified atrial flutter: Secondary | ICD-10-CM | POA: Diagnosis not present

## 2017-07-24 DIAGNOSIS — Z952 Presence of prosthetic heart valve: Secondary | ICD-10-CM

## 2017-07-24 DIAGNOSIS — I480 Paroxysmal atrial fibrillation: Secondary | ICD-10-CM

## 2017-07-24 DIAGNOSIS — Z5181 Encounter for therapeutic drug level monitoring: Secondary | ICD-10-CM | POA: Diagnosis not present

## 2017-07-24 LAB — POCT INR: INR: 3.4 — AB (ref 2.0–3.0)

## 2017-07-31 NOTE — Progress Notes (Signed)
HPI: Follow-up atrial fibrillation and MVR/AVR.  Patient admitted January 2019 with new onset atrial fibrillation and congestive heart failure.  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  Preoperative cardiac catheterization showed normal coronary arteries.  Preoperative carotid Dopplers showed no stenosis.  Patient subsequently underwent aortic valve replacement and mitral valve replacement with mechanical valves and a Maze procedure with ligation of left atrial appendage.  Since last seen, the patient denies any dyspnea on exertion, orthopnea, PND, pedal edema, palpitations, syncope or chest pain.   Current Outpatient Medications  Medication Sig Dispense Refill  . acetaminophen (TYLENOL) 325 MG tablet Take 2 tablets (650 mg total) by mouth every 6 (six) hours as needed for mild pain.    . metoprolol tartrate (LOPRESSOR) 25 MG tablet Take 0.5 tablets (12.5 mg total) by mouth 2 (two) times daily. (Patient taking differently: Take 12.5 mg by mouth daily. ) 30 tablet 2  . warfarin (COUMADIN) 5 MG tablet Take 1 tablet by mouth daily or as directed by coumadin clinic 30 tablet 3   No current facility-administered medications for this visit.      Past Medical History:  Diagnosis Date  . 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 AORTIC VALVE REPLACEMENT N/A 04/04/2017   Procedure: AORTIC VALVE REPLACEMENT (AVR);  Surgeon: Alleen Borne, MD;  Location: [facility] OR;  Service: Open Heart Surgery;  Laterality: N/A;  . MAZE N/A 04/04/2017   Procedure: MAZE;  Surgeon: Alleen Borne, MD;  Location: [facility] OR;  Service: Open Heart Surgery;  Laterality: N/A;  . MITRAL VALVE REPLACEMENT N/A 04/04/2017   Procedure: MITRAL VALVE (MV) REPLACEMENT;  Surgeon:  Alleen Borne, MD;  Location: [facility] OR;  Service: Open Heart Surgery;  Laterality: N/A;  . No prior surgery    . RIGHT/LEFT HEART CATH AND CORONARY ANGIOGRAPHY N/A 04/01/2017   Procedure: RIGHT/LEFT HEART CATH AND CORONARY ANGIOGRAPHY;  Surgeon: Runell Gess, MD;  Location: [facility] INVASIVE CV LAB;  Service: Cardiovascular;  Laterality: N/A;  . TEE WITHOUT CARDIOVERSION N/A 04/04/2017   Procedure: TRANSESOPHAGEAL ECHOCARDIOGRAM (TEE);  Surgeon: Alleen Borne, MD;  Location: [facility] OR;  Service: Open Heart Surgery;  Laterality: N/A;    Social History   Socioeconomic History  . Marital status: Married    Spouse name: Not on file  . Number of children: 3  . Years of education: Not on file  . Highest education level: Not on file  Occupational History  . Not on file  Social Needs  . Financial resource strain: Not on file  . Food insecurity:    Worry: Not on file    Inability: Not on file  . Transportation needs:    Medical: Not on file    Non-medical: Not on file  Tobacco Use  . Smoking status: Former Smoker    Packs/day: 1.00    Types: Cigarettes    Last attempt to quit: 03/30/2017    Years since quitting: 0.3  . Smokeless tobacco: Never Used  Substance and Sexual Activity  . Alcohol use: No    Frequency: Never  . Drug use: No  . Sexual activity: Not on file  Lifestyle  . Physical activity:    Days per week: Not on file    Minutes per session:  Not on file  . Stress: Not on file  Relationships  . Social connections:    Talks on phone: Not on file    Gets together: Not on file    Attends religious service: Not on file    Active member of club or organization: Not on file    Attends meetings of clubs or organizations: Not on file    Relationship status: Not on file  . Intimate partner violence:    Fear of current or ex partner: Not on file    Emotionally abused: Not on file    Physically abused: Not on file    Forced sexual activity: Not on file  Other Topics Concern  .  Not on file  Social History Narrative  . Not on file    Family History  Problem Relation Age of Onset  . CVA Father   . Heart attack Father     ROS: no fevers or chills, productive cough, hemoptysis, dysphasia, odynophagia, melena, hematochezia, dysuria, hematuria, rash, seizure activity, orthopnea, PND, pedal edema, claudication. Remaining systems are negative.  Physical Exam: Well-developed well-nourished in no acute distress.  Skin is warm and dry.  HEENT is normal.  Neck is supple.  Chest is clear to auscultation with normal expansion.  Cardiovascular exam is regular rate and rhythm.  Crisp mechanical valve sound Abdominal exam nontender or distended. No masses palpated. Extremities show no edema. neuro grossly intact  ECG-normal sinus rhythm at a rate of 66.  No ST changes.  Personally reviewed  A/P  1 status post mitral valve replacement/aortic valve replacement-plan to continue Coumadin.  Add aspirin 81 mg daily.  Continue SBE prophylaxis (pt provided SBE card today).  Schedule baseline echocardiogram status post aortic valve replacement.  2 paroxysmal atrial fibrillation-patient remains in sinus rhythm on examination.  She is status post Maze procedure.  She will remain on Coumadin for her valve replacements.  She was having problems with dizziness with metoprolol 12.5 mg twice daily.  She decrease this to 12.5 mg daily and her symptoms improved.  I will discontinue regular metoprolol and instead treat with 12.5 mg of Toprol nightly.  3 chronic diastolic congestive heart failure-volume status is stable and improved since valves were replaced.  Olga Millers, MD

## 2017-08-01 ENCOUNTER — Ambulatory Visit (INDEPENDENT_AMBULATORY_CARE_PROVIDER_SITE_OTHER): Payer: Managed Care, Other (non HMO) | Admitting: Pharmacist

## 2017-08-01 ENCOUNTER — Ambulatory Visit (INDEPENDENT_AMBULATORY_CARE_PROVIDER_SITE_OTHER): Payer: Managed Care, Other (non HMO) | Admitting: Cardiology

## 2017-08-01 ENCOUNTER — Encounter: Payer: Self-pay | Admitting: Cardiology

## 2017-08-01 VITALS — BP 104/68 | HR 66 | Ht 66.0 in | Wt 141.0 lb

## 2017-08-01 DIAGNOSIS — I5032 Chronic diastolic (congestive) heart failure: Secondary | ICD-10-CM

## 2017-08-01 DIAGNOSIS — I480 Paroxysmal atrial fibrillation: Secondary | ICD-10-CM | POA: Diagnosis not present

## 2017-08-01 DIAGNOSIS — Z5181 Encounter for therapeutic drug level monitoring: Secondary | ICD-10-CM | POA: Diagnosis not present

## 2017-08-01 DIAGNOSIS — Z952 Presence of prosthetic heart valve: Secondary | ICD-10-CM

## 2017-08-01 DIAGNOSIS — I4892 Unspecified atrial flutter: Secondary | ICD-10-CM | POA: Diagnosis not present

## 2017-08-01 DIAGNOSIS — I050 Rheumatic mitral stenosis: Secondary | ICD-10-CM | POA: Diagnosis not present

## 2017-08-01 LAB — POCT INR: INR: 3.8 — AB (ref 2.0–3.0)

## 2017-08-01 MED ORDER — METOPROLOL SUCCINATE ER 25 MG PO TB24: 12.5000 mg | ORAL_TABLET | Freq: Every day | ORAL | 3 refills | Status: DC

## 2017-08-01 MED ORDER — ASPIRIN EC 81 MG PO TBEC: 81.0000 mg | DELAYED_RELEASE_TABLET | Freq: Every day | ORAL | 3 refills | Status: DC

## 2017-08-01 NOTE — Patient Instructions (Signed)
Medication Instructions:   START ASPIRIN 81 MG ONCE DAILY  STOP CURRENT METOPROLOL  START METOPROLOL SUCC ER 12.5 MG DAILY AST BEDTIME= 1/2 OF THE 25 MG TABLET AT BEDTIME  Testing/Procedures:  Your physician has requested that you have an echocardiogram. Echocardiography is a painless test that uses sound waves to create images of your heart. It provides your doctor with information about the size and shape of your heart and how well your heart's chambers and valves are working. This procedure takes approximately one hour. There are no restrictions for this procedure.    Follow-Up:  Your physician recommends that you schedule a follow-up appointment in: 3 MONTHS WITH DR Jens Som   If you need a refill on your cardiac medications before your next appointment, please call your pharmacy.

## 2017-08-22 ENCOUNTER — Ambulatory Visit (HOSPITAL_COMMUNITY): Payer: Managed Care, Other (non HMO) | Attending: Cardiology

## 2017-08-22 ENCOUNTER — Other Ambulatory Visit: Payer: Self-pay

## 2017-08-22 ENCOUNTER — Encounter (INDEPENDENT_AMBULATORY_CARE_PROVIDER_SITE_OTHER): Payer: Self-pay

## 2017-08-22 DIAGNOSIS — Z952 Presence of prosthetic heart valve: Secondary | ICD-10-CM | POA: Insufficient documentation

## 2017-08-23 ENCOUNTER — Ambulatory Visit (INDEPENDENT_AMBULATORY_CARE_PROVIDER_SITE_OTHER): Payer: Managed Care, Other (non HMO) | Admitting: Pharmacist

## 2017-08-23 DIAGNOSIS — Z5181 Encounter for therapeutic drug level monitoring: Secondary | ICD-10-CM | POA: Diagnosis not present

## 2017-08-23 DIAGNOSIS — I050 Rheumatic mitral stenosis: Secondary | ICD-10-CM | POA: Diagnosis not present

## 2017-08-23 DIAGNOSIS — I4892 Unspecified atrial flutter: Secondary | ICD-10-CM | POA: Diagnosis not present

## 2017-08-23 DIAGNOSIS — Z952 Presence of prosthetic heart valve: Secondary | ICD-10-CM | POA: Diagnosis not present

## 2017-08-23 DIAGNOSIS — I480 Paroxysmal atrial fibrillation: Secondary | ICD-10-CM

## 2017-08-23 LAB — POCT INR: INR: 4 — AB (ref 2.0–3.0)

## 2017-08-28 ENCOUNTER — Other Ambulatory Visit: Payer: Self-pay | Admitting: Cardiology

## 2017-08-30 ENCOUNTER — Encounter: Payer: Self-pay | Admitting: *Deleted

## 2017-09-13 ENCOUNTER — Ambulatory Visit (INDEPENDENT_AMBULATORY_CARE_PROVIDER_SITE_OTHER): Payer: Managed Care, Other (non HMO) | Admitting: Pharmacist

## 2017-09-13 DIAGNOSIS — Z952 Presence of prosthetic heart valve: Secondary | ICD-10-CM | POA: Diagnosis not present

## 2017-09-13 DIAGNOSIS — I4892 Unspecified atrial flutter: Secondary | ICD-10-CM | POA: Diagnosis not present

## 2017-09-13 DIAGNOSIS — I480 Paroxysmal atrial fibrillation: Secondary | ICD-10-CM

## 2017-09-13 DIAGNOSIS — Z5181 Encounter for therapeutic drug level monitoring: Secondary | ICD-10-CM

## 2017-09-13 DIAGNOSIS — I050 Rheumatic mitral stenosis: Secondary | ICD-10-CM

## 2017-09-13 LAB — POCT INR: INR: 2.7 (ref 2.0–3.0)

## 2017-09-16 ENCOUNTER — Telehealth: Payer: Self-pay | Admitting: Cardiology

## 2017-09-16 NOTE — Telephone Encounter (Signed)
Talked to patient.   No pain, swelling or loss vision associated with red eye.   Patient was instructed to use moisturizing eye drops until redness resolved. Should seek urgent medical attention is loss of vision or pain experienced.

## 2017-09-16 NOTE — Telephone Encounter (Signed)
Spoke with pt, she went into the bathroom and noticed the entire bottom part and the inside part of her eye is bloody. She denies any injury, she has no pain in that eye and her vision is not impaired. Will forward to pharm md for her recommendations as the patient is on warfarin.

## 2017-09-16 NOTE — Telephone Encounter (Signed)
New message    Patient calling with concerns, blood vessel in eye broken

## 2017-10-30 ENCOUNTER — Encounter: Payer: Self-pay | Admitting: *Deleted

## 2017-10-31 NOTE — Progress Notes (Signed)
HPI: Follow-up atrial fibrillation and MVR/AVR.  Patient admitted January 2019 with new onset atrial fibrillation and congestive heart failure.  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  Preoperative cardiac catheterization showed normal coronary arteries.  Preoperative carotid Dopplers showed no stenosis.  Patient subsequently underwent aortic valve replacement and mitral valve replacement with mechanical valves and a Maze procedure with ligation of left atrial appendage.    Postoperative echocardiogram June 2019 showed ejection fraction 40 to 45%, status post aortic valve replacement with mean gradient 14 mmHg, status post mitral valve replacement 4 mmHg.  Since last seen,  she has some fatigue but denies dyspnea, chest pain, palpitations, syncope or bleeding.  Current Outpatient Medications  Medication Sig Dispense Refill  . acetaminophen (TYLENOL) 325 MG tablet Take 2 tablets (650 mg total) by mouth every 6 (six) hours as needed for mild pain.    Marland Kitchen. aspirin EC 81 MG tablet Take 1 tablet (81 mg total) by mouth daily. 90 tablet 3  . metoprolol succinate (TOPROL XL) 25 MG 24 hr tablet Take 0.5 tablets (12.5 mg total) by mouth daily. 45 tablet 3  . warfarin (COUMADIN) 5 MG tablet Take 1-2 tablets by mouth daily or as directed by coumadin clinic 40 tablet 3   No current facility-administered medications for this visit.      Past Medical History:  Diagnosis Date  . 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 AORTIC VALVE REPLACEMENT N/A 04/04/2017   Procedure: AORTIC VALVE REPLACEMENT (AVR);  Surgeon: Alleen BorneBartle, Bryan K, MD;  [facility];  Service: Open Heart Surgery;  Laterality: N/A;  . MAZE N/A 04/04/2017   Procedure: MAZE;  Surgeon:  Alleen BorneBartle, Bryan K, MD;  Location: [facility] OR;  Service: Open Heart Surgery;  Laterality: N/A;  . MITRAL VALVE REPLACEMENT N/A 04/04/2017   Procedure: MITRAL VALVE (MV) REPLACEMENT;  Surgeon: Alleen BorneBartle, Bryan K, MD;  Location: [facility] OR;  Service: Open Heart Surgery;  Laterality: N/A;  . No prior surgery    . RIGHT/LEFT HEART CATH AND CORONARY ANGIOGRAPHY N/A 04/01/2017   Procedure: RIGHT/LEFT HEART CATH AND CORONARY ANGIOGRAPHY;  Surgeon: Runell GessBerry, Jonathan J, MD;  Location: [facility] INVASIVE CV LAB;  Service: Cardiovascular;  Laterality: N/A;  . TEE WITHOUT CARDIOVERSION N/A 04/04/2017   Procedure: TRANSESOPHAGEAL ECHOCARDIOGRAM (TEE);  Surgeon: Alleen BorneBartle, Bryan K, MD;  [facility];  Service: Open Heart Surgery;  Laterality: N/A;    Social History   Socioeconomic History  . Marital status: Married    Spouse name: Not on file  . Number of children: 3  . Years of education: Not on file  . Highest education level: Not on file  Occupational History  . Not on file  Social Needs  . Financial resource strain: Not on file  . Food insecurity:    Worry: Not on file    Inability: Not on file  . Transportation needs:    Medical: Not on file    Non-medical: Not on file  Tobacco Use  . Smoking status: Former Smoker    Packs/day: 1.00    Types: Cigarettes    Last attempt to quit: 03/30/2017    Years since quitting: 0.5  . Smokeless tobacco: Never Used  Substance and Sexual Activity  . Alcohol use: No  Frequency: Never  . Drug use: No  . Sexual activity: Not on file  Lifestyle  . Physical activity:    Days per week: Not on file    Minutes per session: Not on file  . Stress: Not on file  Relationships  . Social connections:    Talks on phone: Not on file    Gets together: Not on file    Attends religious service: Not on file    Active member of club or organization: Not on file    Attends meetings of clubs or organizations: Not on file    Relationship status: Not on file  . Intimate partner violence:     Fear of current or ex partner: Not on file    Emotionally abused: Not on file    Physically abused: Not on file    Forced sexual activity: Not on file  Other Topics Concern  . Not on file  Social History Narrative  . Not on file    Family History  Problem Relation Age of Onset  . CVA Father   . Heart attack Father     ROS: no fevers or chills, productive cough, hemoptysis, dysphasia, odynophagia, melena, hematochezia, dysuria, hematuria, rash, seizure activity, orthopnea, PND, pedal edema, claudication. Remaining systems are negative.  Physical Exam: Well-developed well-nourished in no acute distress.  Skin is warm and dry.  HEENT is normal.  Neck is supple.  Chest is clear to auscultation with normal expansion.  Cardiovascular exam is regular rate and rhythm.  Crisp mechanical valve sounds.  No diastolic murmur. Abdominal exam nontender or distended. No masses palpated. Extremities show no edema. neuro grossly intact  A/P  1 prior aortic and mitral valve replacement-continue Coumadin with goal INR 2.5-3.5.  Continue aspirin.  We discussed importance of SBE prophylaxis.  2 paroxysmal atrial fibrillation-patient underwent Maze procedure at time of surgery.  We will continue with low-dose beta-blocker.  She remains in sinus rhythm on examination.  Continue Coumadin.  3 chronic diastolic congestive heart failure-patient doing well symptomatically.  Volume status has improved following valve replacement.  Olga Millers, MD

## 2017-11-01 ENCOUNTER — Encounter: Payer: Self-pay | Admitting: Cardiology

## 2017-11-01 ENCOUNTER — Ambulatory Visit (INDEPENDENT_AMBULATORY_CARE_PROVIDER_SITE_OTHER): Payer: Managed Care, Other (non HMO) | Admitting: Pharmacist

## 2017-11-01 ENCOUNTER — Ambulatory Visit (INDEPENDENT_AMBULATORY_CARE_PROVIDER_SITE_OTHER): Payer: Managed Care, Other (non HMO) | Admitting: Cardiology

## 2017-11-01 VITALS — BP 102/70 | HR 76 | Ht 66.0 in | Wt 155.0 lb

## 2017-11-01 DIAGNOSIS — I480 Paroxysmal atrial fibrillation: Secondary | ICD-10-CM | POA: Diagnosis not present

## 2017-11-01 DIAGNOSIS — Z952 Presence of prosthetic heart valve: Secondary | ICD-10-CM

## 2017-11-01 DIAGNOSIS — I4892 Unspecified atrial flutter: Secondary | ICD-10-CM

## 2017-11-01 DIAGNOSIS — Z5181 Encounter for therapeutic drug level monitoring: Secondary | ICD-10-CM | POA: Diagnosis not present

## 2017-11-01 DIAGNOSIS — I050 Rheumatic mitral stenosis: Secondary | ICD-10-CM | POA: Diagnosis not present

## 2017-11-01 LAB — POCT INR: INR: 3.3 — AB (ref 2.0–3.0)

## 2017-11-01 NOTE — Patient Instructions (Signed)
Your physician wants you to follow-up in: 6 MONTHS WITH DR CRENSHAW You will receive a reminder letter in the mail two months in advance. If you don't receive a letter, please call our office to schedule the follow-up appointment.   If you need a refill on your cardiac medications before your next appointment, please call your pharmacy.  

## 2017-12-02 ENCOUNTER — Ambulatory Visit (INDEPENDENT_AMBULATORY_CARE_PROVIDER_SITE_OTHER): Payer: Managed Care, Other (non HMO) | Admitting: Pharmacist Clinician (PhC)/ Clinical Pharmacy Specialist

## 2017-12-02 DIAGNOSIS — I050 Rheumatic mitral stenosis: Secondary | ICD-10-CM | POA: Diagnosis not present

## 2017-12-02 DIAGNOSIS — Z5181 Encounter for therapeutic drug level monitoring: Secondary | ICD-10-CM

## 2017-12-02 DIAGNOSIS — Z952 Presence of prosthetic heart valve: Secondary | ICD-10-CM | POA: Diagnosis not present

## 2017-12-02 DIAGNOSIS — I4892 Unspecified atrial flutter: Secondary | ICD-10-CM

## 2017-12-02 DIAGNOSIS — I480 Paroxysmal atrial fibrillation: Secondary | ICD-10-CM

## 2017-12-02 LAB — POCT INR: INR: 2.9 (ref 2.0–3.0)

## 2017-12-03 ENCOUNTER — Telehealth: Payer: Self-pay | Admitting: Cardiology

## 2017-12-03 ENCOUNTER — Other Ambulatory Visit: Payer: Self-pay | Admitting: *Deleted

## 2017-12-03 MED ORDER — WARFARIN SODIUM 5 MG PO TABS: ORAL_TABLET | ORAL | 5 refills | Status: DC

## 2017-12-03 NOTE — Telephone Encounter (Signed)
°*  STAT* If patient is at the pharmacy, call can be transferred to refill team.   1. Which medications need to be refilled? (please list name of each medication and dose if known) coumadin   2. Which pharmacy/location (including street and city if local pharmacy) is medication to be sent to? [facility] [address] [facility] [place]  3. Do they need a 30 day or 90 day supply? Not sure but patient states that she needs more to last.

## 2017-12-04 MED ORDER — WARFARIN SODIUM 5 MG PO TABS: ORAL_TABLET | ORAL | 0 refills | Status: DC

## 2017-12-27 ENCOUNTER — Ambulatory Visit (INDEPENDENT_AMBULATORY_CARE_PROVIDER_SITE_OTHER): Payer: Managed Care, Other (non HMO) | Admitting: Pharmacist

## 2017-12-27 DIAGNOSIS — Z5181 Encounter for therapeutic drug level monitoring: Secondary | ICD-10-CM | POA: Diagnosis not present

## 2017-12-27 DIAGNOSIS — Z952 Presence of prosthetic heart valve: Secondary | ICD-10-CM

## 2017-12-27 DIAGNOSIS — I050 Rheumatic mitral stenosis: Secondary | ICD-10-CM | POA: Diagnosis not present

## 2017-12-27 DIAGNOSIS — I4892 Unspecified atrial flutter: Secondary | ICD-10-CM

## 2017-12-27 DIAGNOSIS — I480 Paroxysmal atrial fibrillation: Secondary | ICD-10-CM

## 2017-12-27 LAB — POCT INR: INR: 3.4 — AB (ref 2.0–3.0)

## 2018-01-24 ENCOUNTER — Ambulatory Visit (INDEPENDENT_AMBULATORY_CARE_PROVIDER_SITE_OTHER): Payer: Managed Care, Other (non HMO) | Admitting: Pharmacist Clinician (PhC)/ Clinical Pharmacy Specialist

## 2018-01-24 DIAGNOSIS — I480 Paroxysmal atrial fibrillation: Secondary | ICD-10-CM

## 2018-01-24 DIAGNOSIS — Z952 Presence of prosthetic heart valve: Secondary | ICD-10-CM | POA: Diagnosis not present

## 2018-01-24 DIAGNOSIS — I050 Rheumatic mitral stenosis: Secondary | ICD-10-CM

## 2018-01-24 DIAGNOSIS — Z5181 Encounter for therapeutic drug level monitoring: Secondary | ICD-10-CM | POA: Diagnosis not present

## 2018-01-24 DIAGNOSIS — I4892 Unspecified atrial flutter: Secondary | ICD-10-CM

## 2018-01-24 LAB — POCT INR: INR: 2.9 (ref 2.0–3.0)

## 2018-02-24 ENCOUNTER — Telehealth: Payer: Self-pay | Admitting: Cardiology

## 2018-02-24 NOTE — Telephone Encounter (Signed)
° ° °  Patient would like advice, what medication OTC is best to take for head cold while on Coumadin. Please call

## 2018-02-24 NOTE — Telephone Encounter (Signed)
Returned call to patient-advised per pharmD, ok to take cough/cold medicine but avoid products with ibuprofen or aspirin.    Patient aware and verbalized understanding.

## 2018-02-28 ENCOUNTER — Ambulatory Visit (INDEPENDENT_AMBULATORY_CARE_PROVIDER_SITE_OTHER): Payer: Managed Care, Other (non HMO) | Admitting: Pharmacist

## 2018-02-28 ENCOUNTER — Encounter (INDEPENDENT_AMBULATORY_CARE_PROVIDER_SITE_OTHER): Payer: Self-pay

## 2018-02-28 DIAGNOSIS — I050 Rheumatic mitral stenosis: Secondary | ICD-10-CM

## 2018-02-28 DIAGNOSIS — I480 Paroxysmal atrial fibrillation: Secondary | ICD-10-CM | POA: Diagnosis not present

## 2018-02-28 DIAGNOSIS — Z952 Presence of prosthetic heart valve: Secondary | ICD-10-CM | POA: Diagnosis not present

## 2018-02-28 DIAGNOSIS — I4892 Unspecified atrial flutter: Secondary | ICD-10-CM | POA: Diagnosis not present

## 2018-02-28 DIAGNOSIS — Z5181 Encounter for therapeutic drug level monitoring: Secondary | ICD-10-CM | POA: Diagnosis not present

## 2018-02-28 LAB — POCT INR: INR: 3.4 — AB (ref 2.0–3.0)

## 2018-04-04 ENCOUNTER — Ambulatory Visit (INDEPENDENT_AMBULATORY_CARE_PROVIDER_SITE_OTHER): Payer: BLUE CROSS/BLUE SHIELD | Admitting: Pharmacist

## 2018-04-04 DIAGNOSIS — I4892 Unspecified atrial flutter: Secondary | ICD-10-CM

## 2018-04-04 DIAGNOSIS — I050 Rheumatic mitral stenosis: Secondary | ICD-10-CM | POA: Diagnosis not present

## 2018-04-04 DIAGNOSIS — Z5181 Encounter for therapeutic drug level monitoring: Secondary | ICD-10-CM | POA: Diagnosis not present

## 2018-04-04 DIAGNOSIS — Z952 Presence of prosthetic heart valve: Secondary | ICD-10-CM

## 2018-04-04 DIAGNOSIS — I480 Paroxysmal atrial fibrillation: Secondary | ICD-10-CM

## 2018-04-04 LAB — POCT INR: INR: 3 (ref 2.0–3.0)

## 2018-04-09 ENCOUNTER — Ambulatory Visit (INDEPENDENT_AMBULATORY_CARE_PROVIDER_SITE_OTHER): Payer: BLUE CROSS/BLUE SHIELD | Admitting: Pharmacist

## 2018-04-09 DIAGNOSIS — Z5181 Encounter for therapeutic drug level monitoring: Secondary | ICD-10-CM

## 2018-04-09 DIAGNOSIS — I4892 Unspecified atrial flutter: Secondary | ICD-10-CM | POA: Diagnosis not present

## 2018-04-09 DIAGNOSIS — I050 Rheumatic mitral stenosis: Secondary | ICD-10-CM

## 2018-04-09 DIAGNOSIS — Z952 Presence of prosthetic heart valve: Secondary | ICD-10-CM | POA: Diagnosis not present

## 2018-04-09 DIAGNOSIS — I480 Paroxysmal atrial fibrillation: Secondary | ICD-10-CM

## 2018-04-09 LAB — POCT INR: INR: 2.9 (ref 2.0–3.0)

## 2018-05-16 ENCOUNTER — Ambulatory Visit (INDEPENDENT_AMBULATORY_CARE_PROVIDER_SITE_OTHER): Payer: BLUE CROSS/BLUE SHIELD | Admitting: *Deleted

## 2018-05-16 ENCOUNTER — Other Ambulatory Visit: Payer: Self-pay

## 2018-05-16 DIAGNOSIS — I4892 Unspecified atrial flutter: Secondary | ICD-10-CM | POA: Diagnosis not present

## 2018-05-16 DIAGNOSIS — Z5181 Encounter for therapeutic drug level monitoring: Secondary | ICD-10-CM | POA: Diagnosis not present

## 2018-05-16 DIAGNOSIS — Z952 Presence of prosthetic heart valve: Secondary | ICD-10-CM | POA: Diagnosis not present

## 2018-05-16 DIAGNOSIS — I480 Paroxysmal atrial fibrillation: Secondary | ICD-10-CM | POA: Diagnosis not present

## 2018-05-16 DIAGNOSIS — I050 Rheumatic mitral stenosis: Secondary | ICD-10-CM

## 2018-05-16 LAB — POCT INR: INR: 3.8 — AB (ref 2.0–3.0)

## 2018-05-16 NOTE — Patient Instructions (Signed)
Description   Today only take 1 tablet, then  continue 1 tablet daily except for 1.5 tables every Monday, Wednesday and Friday; recheck INR in 4 weeks.

## 2018-07-16 ENCOUNTER — Other Ambulatory Visit: Payer: Self-pay | Admitting: Cardiology

## 2018-08-06 DIAGNOSIS — M545 Low back pain: Secondary | ICD-10-CM | POA: Diagnosis not present

## 2018-09-04 DIAGNOSIS — M7061 Trochanteric bursitis, right hip: Secondary | ICD-10-CM | POA: Diagnosis not present

## 2018-09-04 DIAGNOSIS — M545 Low back pain: Secondary | ICD-10-CM | POA: Diagnosis not present

## 2018-10-01 ENCOUNTER — Other Ambulatory Visit: Payer: Self-pay | Admitting: Cardiology

## 2018-10-01 NOTE — Telephone Encounter (Signed)
New Message      *STAT* If patient is at the pharmacy, call can be transferred to refill team.   1. Which medications need to be refilled? (please list name of each medication and dose if known) Warfarin, Metoprolol   2. Which pharmacy/location (including street and city if local pharmacy) is medication to be sent to? [facility] [address]   3. Do they need a 30 day or 90 day supply? 30 or 90

## 2018-10-03 MED ORDER — WARFARIN SODIUM 5 MG PO TABS: ORAL_TABLET | ORAL | 0 refills | Status: DC

## 2018-10-03 MED ORDER — METOPROLOL SUCCINATE ER 25 MG PO TB24: 12.5000 mg | ORAL_TABLET | Freq: Every day | ORAL | 1 refills | Status: DC

## 2018-10-06 ENCOUNTER — Ambulatory Visit: Payer: BC Managed Care – PPO | Admitting: Cardiology

## 2018-10-13 ENCOUNTER — Telehealth: Payer: Self-pay

## 2018-10-13 NOTE — Telephone Encounter (Signed)
lmom for overdue inr 

## 2018-10-17 ENCOUNTER — Other Ambulatory Visit: Payer: Self-pay

## 2018-10-17 ENCOUNTER — Telehealth: Payer: Self-pay

## 2018-10-17 ENCOUNTER — Ambulatory Visit (INDEPENDENT_AMBULATORY_CARE_PROVIDER_SITE_OTHER): Payer: BC Managed Care – PPO | Admitting: *Deleted

## 2018-10-17 ENCOUNTER — Telehealth: Payer: Self-pay | Admitting: Cardiology

## 2018-10-17 DIAGNOSIS — Z952 Presence of prosthetic heart valve: Secondary | ICD-10-CM | POA: Diagnosis not present

## 2018-10-17 DIAGNOSIS — I480 Paroxysmal atrial fibrillation: Secondary | ICD-10-CM

## 2018-10-17 DIAGNOSIS — Z5181 Encounter for therapeutic drug level monitoring: Secondary | ICD-10-CM | POA: Diagnosis not present

## 2018-10-17 DIAGNOSIS — I4892 Unspecified atrial flutter: Secondary | ICD-10-CM | POA: Diagnosis not present

## 2018-10-17 DIAGNOSIS — I050 Rheumatic mitral stenosis: Secondary | ICD-10-CM

## 2018-10-17 LAB — PROTIME-INR
INR: 5.4 (ref 0.8–1.2)
Prothrombin Time: 53.6 s — ABNORMAL HIGH (ref 9.1–12.0)

## 2018-10-17 LAB — POCT INR: INR: 6 — AB (ref 2.0–3.0)

## 2018-10-17 NOTE — Telephone Encounter (Signed)
Please see anticoagulation clinic note for details

## 2018-10-17 NOTE — Telephone Encounter (Signed)
Received a call from [employer] [facility] calling to report critical INR 5.4 PT 53.6.Message sent to Coumadin clinic.

## 2018-10-17 NOTE — Telephone Encounter (Signed)
New Message    Patient is calling to get an update on how much warfarin she is to take

## 2018-10-20 ENCOUNTER — Ambulatory Visit (INDEPENDENT_AMBULATORY_CARE_PROVIDER_SITE_OTHER): Payer: BC Managed Care – PPO | Admitting: *Deleted

## 2018-10-20 ENCOUNTER — Other Ambulatory Visit: Payer: Self-pay

## 2018-10-20 DIAGNOSIS — I4892 Unspecified atrial flutter: Secondary | ICD-10-CM | POA: Diagnosis not present

## 2018-10-20 DIAGNOSIS — I480 Paroxysmal atrial fibrillation: Secondary | ICD-10-CM

## 2018-10-20 DIAGNOSIS — Z952 Presence of prosthetic heart valve: Secondary | ICD-10-CM

## 2018-10-20 DIAGNOSIS — Z5181 Encounter for therapeutic drug level monitoring: Secondary | ICD-10-CM

## 2018-10-20 LAB — POCT INR: INR: 1.5 — AB (ref 2.0–3.0)

## 2018-10-20 NOTE — Patient Instructions (Signed)
Description   Today take 2 tablets, tomorrow take 1.5 tablets, then resume taking 1 tablet daily except 1.5 tablets on Mondays, Wednesdays and Fridays. Recheck in 7-10 days.

## 2018-10-28 ENCOUNTER — Ambulatory Visit (INDEPENDENT_AMBULATORY_CARE_PROVIDER_SITE_OTHER): Payer: BC Managed Care – PPO | Admitting: Pharmacist

## 2018-10-28 ENCOUNTER — Other Ambulatory Visit: Payer: Self-pay

## 2018-10-28 DIAGNOSIS — I480 Paroxysmal atrial fibrillation: Secondary | ICD-10-CM | POA: Diagnosis not present

## 2018-10-28 DIAGNOSIS — I4892 Unspecified atrial flutter: Secondary | ICD-10-CM | POA: Diagnosis not present

## 2018-10-28 DIAGNOSIS — Z5181 Encounter for therapeutic drug level monitoring: Secondary | ICD-10-CM

## 2018-10-28 DIAGNOSIS — Z952 Presence of prosthetic heart valve: Secondary | ICD-10-CM | POA: Diagnosis not present

## 2018-10-28 LAB — POCT INR: INR: 3.3 — AB (ref 2.0–3.0)

## 2018-11-18 ENCOUNTER — Inpatient Hospital Stay (HOSPITAL_COMMUNITY)
Admission: EM | Admit: 2018-11-18 | Discharge: 2018-11-26 | DRG: 377 | Disposition: A | Discharge status: Home / Self Care | Payer: BC Managed Care – PPO | Attending: Internal Medicine | Admitting: Internal Medicine

## 2018-11-18 ENCOUNTER — Other Ambulatory Visit: Payer: Self-pay

## 2018-11-18 ENCOUNTER — Encounter (HOSPITAL_COMMUNITY): Payer: Self-pay

## 2018-11-18 ENCOUNTER — Emergency Department (HOSPITAL_COMMUNITY): Payer: BC Managed Care – PPO

## 2018-11-18 DIAGNOSIS — R578 Other shock: Secondary | ICD-10-CM | POA: Diagnosis present

## 2018-11-18 DIAGNOSIS — K254 Chronic or unspecified gastric ulcer with hemorrhage: Principal | ICD-10-CM | POA: Diagnosis present

## 2018-11-18 DIAGNOSIS — D62 Acute posthemorrhagic anemia: Secondary | ICD-10-CM | POA: Diagnosis not present

## 2018-11-18 DIAGNOSIS — Z7982 Long term (current) use of aspirin: Secondary | ICD-10-CM | POA: Diagnosis not present

## 2018-11-18 DIAGNOSIS — I480 Paroxysmal atrial fibrillation: Secondary | ICD-10-CM | POA: Diagnosis not present

## 2018-11-18 DIAGNOSIS — K635 Polyp of colon: Secondary | ICD-10-CM | POA: Diagnosis not present

## 2018-11-18 DIAGNOSIS — Z823 Family history of stroke: Secondary | ICD-10-CM | POA: Diagnosis not present

## 2018-11-18 DIAGNOSIS — D689 Coagulation defect, unspecified: Secondary | ICD-10-CM | POA: Diagnosis not present

## 2018-11-18 DIAGNOSIS — I10 Essential (primary) hypertension: Secondary | ICD-10-CM | POA: Diagnosis present

## 2018-11-18 DIAGNOSIS — K621 Rectal polyp: Secondary | ICD-10-CM | POA: Diagnosis not present

## 2018-11-18 DIAGNOSIS — I4891 Unspecified atrial fibrillation: Secondary | ICD-10-CM

## 2018-11-18 DIAGNOSIS — Z79899 Other long term (current) drug therapy: Secondary | ICD-10-CM

## 2018-11-18 DIAGNOSIS — D649 Anemia, unspecified: Secondary | ICD-10-CM | POA: Diagnosis not present

## 2018-11-18 DIAGNOSIS — K222 Esophageal obstruction: Secondary | ICD-10-CM | POA: Diagnosis present

## 2018-11-18 DIAGNOSIS — R55 Syncope and collapse: Secondary | ICD-10-CM

## 2018-11-18 DIAGNOSIS — D123 Benign neoplasm of transverse colon: Secondary | ICD-10-CM | POA: Diagnosis not present

## 2018-11-18 DIAGNOSIS — Z8619 Personal history of other infectious and parasitic diseases: Secondary | ICD-10-CM | POA: Diagnosis not present

## 2018-11-18 DIAGNOSIS — Z20828 Contact with and (suspected) exposure to other viral communicable diseases: Secondary | ICD-10-CM | POA: Diagnosis present

## 2018-11-18 DIAGNOSIS — R0902 Hypoxemia: Secondary | ICD-10-CM | POA: Diagnosis not present

## 2018-11-18 DIAGNOSIS — Z8249 Family history of ischemic heart disease and other diseases of the circulatory system: Secondary | ICD-10-CM | POA: Diagnosis not present

## 2018-11-18 DIAGNOSIS — Z7901 Long term (current) use of anticoagulants: Secondary | ICD-10-CM

## 2018-11-18 DIAGNOSIS — K449 Diaphragmatic hernia without obstruction or gangrene: Secondary | ICD-10-CM | POA: Diagnosis present

## 2018-11-18 DIAGNOSIS — I342 Nonrheumatic mitral (valve) stenosis: Secondary | ICD-10-CM | POA: Diagnosis not present

## 2018-11-18 DIAGNOSIS — K3189 Other diseases of stomach and duodenum: Secondary | ICD-10-CM | POA: Diagnosis not present

## 2018-11-18 DIAGNOSIS — K922 Gastrointestinal hemorrhage, unspecified: Secondary | ICD-10-CM | POA: Diagnosis not present

## 2018-11-18 DIAGNOSIS — Z87891 Personal history of nicotine dependence: Secondary | ICD-10-CM

## 2018-11-18 DIAGNOSIS — I361 Nonrheumatic tricuspid (valve) insufficiency: Secondary | ICD-10-CM | POA: Diagnosis not present

## 2018-11-18 DIAGNOSIS — R42 Dizziness and giddiness: Secondary | ICD-10-CM | POA: Diagnosis not present

## 2018-11-18 DIAGNOSIS — R791 Abnormal coagulation profile: Secondary | ICD-10-CM

## 2018-11-18 DIAGNOSIS — K921 Melena: Secondary | ICD-10-CM | POA: Diagnosis not present

## 2018-11-18 DIAGNOSIS — Z952 Presence of prosthetic heart valve: Secondary | ICD-10-CM | POA: Diagnosis not present

## 2018-11-18 DIAGNOSIS — E876 Hypokalemia: Secondary | ICD-10-CM | POA: Diagnosis not present

## 2018-11-18 DIAGNOSIS — D120 Benign neoplasm of cecum: Secondary | ICD-10-CM | POA: Diagnosis not present

## 2018-11-18 DIAGNOSIS — K259 Gastric ulcer, unspecified as acute or chronic, without hemorrhage or perforation: Secondary | ICD-10-CM | POA: Diagnosis not present

## 2018-11-18 DIAGNOSIS — K319 Disease of stomach and duodenum, unspecified: Secondary | ICD-10-CM | POA: Diagnosis not present

## 2018-11-18 DIAGNOSIS — I499 Cardiac arrhythmia, unspecified: Secondary | ICD-10-CM | POA: Diagnosis not present

## 2018-11-18 LAB — CBC WITH DIFFERENTIAL/PLATELET
Abs Immature Granulocytes: 0 10*3/uL (ref 0.00–0.07)
Basophils Absolute: 0 10*3/uL (ref 0.0–0.1)
Basophils Relative: 0 %
Eosinophils Absolute: 0.3 10*3/uL (ref 0.0–0.5)
Eosinophils Relative: 1 %
HCT: 10.2 % — ABNORMAL LOW (ref 36.0–46.0)
Hemoglobin: 3.4 g/dL — CL (ref 12.0–15.0)
Lymphocytes Relative: 14 %
Lymphs Abs: 4.2 10*3/uL — ABNORMAL HIGH (ref 0.7–4.0)
MCH: 33 pg (ref 26.0–34.0)
MCHC: 33.3 g/dL (ref 30.0–36.0)
MCV: 99 fL (ref 80.0–100.0)
Monocytes Absolute: 2.4 10*3/uL — ABNORMAL HIGH (ref 0.1–1.0)
Monocytes Relative: 8 %
Neutro Abs: 23 10*3/uL — ABNORMAL HIGH (ref 1.7–7.7)
Neutrophils Relative %: 77 %
Platelets: 239 10*3/uL (ref 150–400)
RBC: 1.03 MIL/uL — ABNORMAL LOW (ref 3.87–5.11)
RDW: 15.1 % (ref 11.5–15.5)
WBC: 29.9 10*3/uL — ABNORMAL HIGH (ref 4.0–10.5)
nRBC: 5.4 % — ABNORMAL HIGH (ref 0.0–0.2)

## 2018-11-18 LAB — TSH: TSH: 6.143 u[IU]/mL — ABNORMAL HIGH (ref 0.350–4.500)

## 2018-11-18 LAB — COMPREHENSIVE METABOLIC PANEL
ALT: 12 U/L (ref 0–44)
AST: 17 U/L (ref 15–41)
Albumin: 2.7 g/dL — ABNORMAL LOW (ref 3.5–5.0)
Alkaline Phosphatase: 37 U/L — ABNORMAL LOW (ref 38–126)
Anion gap: 9 (ref 5–15)
BUN: 43 mg/dL — ABNORMAL HIGH (ref 6–20)
CO2: 20 mmol/L — ABNORMAL LOW (ref 22–32)
Calcium: 7.6 mg/dL — ABNORMAL LOW (ref 8.9–10.3)
Chloride: 105 mmol/L (ref 98–111)
Creatinine, Ser: 1.1 mg/dL — ABNORMAL HIGH (ref 0.44–1.00)
GFR calc Af Amer: >60 mL/min (ref >60)
GFR calc non Af Amer: 55 mL/min — ABNORMAL LOW (ref >60)
Glucose, Bld: 137 mg/dL — ABNORMAL HIGH (ref 70–99)
Potassium: 3.7 mmol/L (ref 3.5–5.1)
Sodium: 134 mmol/L — ABNORMAL LOW (ref 135–145)
Total Bilirubin: 0.5 mg/dL (ref 0.3–1.2)
Total Protein: 4.5 g/dL — ABNORMAL LOW (ref 6.5–8.1)

## 2018-11-18 LAB — CBC
HCT: 16 % — ABNORMAL LOW (ref 36.0–46.0)
Hemoglobin: 5.5 g/dL — CL (ref 12.0–15.0)
MCH: 32.7 pg (ref 26.0–34.0)
MCHC: 34.4 g/dL (ref 30.0–36.0)
MCV: 95.2 fL (ref 80.0–100.0)
Platelets: 164 10*3/uL (ref 150–400)
RBC: 1.68 MIL/uL — ABNORMAL LOW (ref 3.87–5.11)
RDW: 13.2 % (ref 11.5–15.5)
WBC: 19.4 10*3/uL — ABNORMAL HIGH (ref 4.0–10.5)
nRBC: 3.8 % — ABNORMAL HIGH (ref 0.0–0.2)

## 2018-11-18 LAB — GLUCOSE, CAPILLARY: Glucose-Capillary: 115 mg/dL — ABNORMAL HIGH (ref 70–99)

## 2018-11-18 LAB — LIPASE, BLOOD: Lipase: 31 U/L (ref 11–51)

## 2018-11-18 LAB — PROTIME-INR
INR: >10 (ref 0.8–1.2)
Prothrombin Time: >90 seconds — ABNORMAL HIGH (ref 11.4–15.2)

## 2018-11-18 LAB — TROPONIN I (HIGH SENSITIVITY)
Troponin I (High Sensitivity): 16 ng/L (ref <18)
Troponin I (High Sensitivity): 19 ng/L — ABNORMAL HIGH (ref <18)

## 2018-11-18 LAB — LACTIC ACID, PLASMA
Lactic Acid, Venous: 4.4 mmol/L (ref 0.5–1.9)
Lactic Acid, Venous: 3.1 mmol/L (ref 0.5–1.9)
Lactic Acid, Venous: 1.8 mmol/L (ref 0.5–1.9)

## 2018-11-18 LAB — PREPARE RBC (CROSSMATCH)

## 2018-11-18 LAB — BRAIN NATRIURETIC PEPTIDE: B Natriuretic Peptide: 249.3 pg/mL — ABNORMAL HIGH (ref 0.0–100.0)

## 2018-11-18 LAB — POC OCCULT BLOOD, ED: Fecal Occult Bld: POSITIVE — AB

## 2018-11-18 LAB — MAGNESIUM: Magnesium: 1.6 mg/dL — ABNORMAL LOW (ref 1.7–2.4)

## 2018-11-18 LAB — SARS CORONAVIRUS 2 BY RT PCR (HOSPITAL ORDER, PERFORMED IN ~~LOC~~ HOSPITAL LAB): SARS Coronavirus 2: NEGATIVE

## 2018-11-18 MED ORDER — ACETAMINOPHEN 325 MG PO TABS
650.0000 mg | ORAL_TABLET | Freq: Four times a day (QID) | ORAL | Status: DC | PRN
  Administered 2018-11-19: 650 mg via ORAL
  Filled 2018-11-18: qty 2

## 2018-11-18 MED ORDER — SODIUM CHLORIDE 0.9 % IV BOLUS
1000.0000 mL | Freq: Once | INTRAVENOUS | Status: AC
  Administered 2018-11-18: 1000 mL via INTRAVENOUS

## 2018-11-18 MED ORDER — CHLORHEXIDINE GLUCONATE CLOTH 2 % EX PADS
6.0000 | MEDICATED_PAD | Freq: Every day | CUTANEOUS | Status: DC
  Administered 2018-11-20 – 2018-11-22 (×2): 6 via TOPICAL

## 2018-11-18 MED ORDER — SODIUM CHLORIDE 0.9% IV SOLUTION: Freq: Once | INTRAVENOUS | Status: DC

## 2018-11-18 MED ORDER — PANTOPRAZOLE SODIUM 40 MG IV SOLR
40.0000 mg | Freq: Two times a day (BID) | INTRAVENOUS | Status: DC
  Administered 2018-11-19 – 2018-11-26 (×13): 40 mg via INTRAVENOUS
  Filled 2018-11-18 (×13): qty 40

## 2018-11-18 MED ORDER — SODIUM CHLORIDE 0.9 % IV SOLN
10.0000 mL/h | Freq: Once | INTRAVENOUS | Status: AC
  Administered 2018-11-18: 10 mL/h via INTRAVENOUS

## 2018-11-18 MED ORDER — PANTOPRAZOLE SODIUM 40 MG IV SOLR: 40.0000 mg | Freq: Two times a day (BID) | INTRAVENOUS | Status: DC

## 2018-11-18 MED ORDER — MAGNESIUM SULFATE 2 GM/50ML IV SOLN
2.0000 g | Freq: Once | INTRAVENOUS | Status: AC
  Administered 2018-11-18: 2 g via INTRAVENOUS
  Filled 2018-11-18: qty 50

## 2018-11-18 MED ORDER — VITAMIN K1 10 MG/ML IJ SOLN
5.0000 mg | INTRAVENOUS | Status: AC
  Administered 2018-11-18: 5 mg via INTRAVENOUS
  Filled 2018-11-18: qty 0.5

## 2018-11-18 MED ORDER — PANTOPRAZOLE SODIUM 40 MG IV SOLR
40.0000 mg | Freq: Once | INTRAVENOUS | Status: AC
  Administered 2018-11-18: 40 mg via INTRAVENOUS
  Filled 2018-11-18: qty 40

## 2018-11-18 NOTE — ED Provider Notes (Signed)
[facility] Provider Note   CSN: [id number] Arrival date & time: 11/18/18  1710     History   Chief Complaint Chief Complaint  Patient presents with  . Dizziness  . Hypotension  . GI Bleeding    HPI Sharon Figueroa is a 58 y.o. female.     The history is provided by the patient and medical records. No language interpreter was used.  Dizziness Quality:  Lightheadedness Severity:  Severe Onset quality:  Gradual Duration:  4 days Timing:  Constant Progression:  Waxing and waning Chronicity:  New Context: loss of consciousness and standing up   Relieved by:  Nothing Worsened by:  Nothing Associated symptoms: blood in stool, diarrhea and syncope   Associated symptoms: no chest pain, no headaches, no nausea, no palpitations, no shortness of breath, no vomiting and no weakness     Past Medical History:  Diagnosis Date  . Mitral valve disorder    "leaky" valve diagnosed 30+ yr ago    Patient Active Problem List   Diagnosis Date Noted  . Encounter for therapeutic drug monitoring 04/12/2017  . Atrial flutter with rapid ventricular response (HCC)   . S/P MVR (mitral valve replacement) 04/04/2017  . S/P AVR 04/04/2017  . Rheumatic heart disease   . Severe mitral valve stenosis   . Atrial fibrillation (HCC) 03/30/2017    Past Surgical History:  Procedure Laterality Date  . AORTIC VALVE REPLACEMENT N/A 04/04/2017   Procedure: AORTIC VALVE REPLACEMENT (AVR);  Surgeon: Alleen BorneBartle, Bryan K, MD;  [facility];  Service: Open Heart Surgery;  Laterality: N/A;  . MAZE N/A 04/04/2017   Procedure: MAZE;  Surgeon: Alleen BorneBartle, Bryan K, MD;  Location: [facility] OR;  Service: Open Heart Surgery;  Laterality: N/A;  . MITRAL VALVE REPLACEMENT N/A 04/04/2017   Procedure: MITRAL VALVE (MV) REPLACEMENT;  Surgeon: Alleen BorneBartle, Bryan K, MD;  Location: [facility] OR;  Service: Open Heart Surgery;  Laterality: N/A;  . No prior surgery    . RIGHT/LEFT HEART CATH AND CORONARY  ANGIOGRAPHY N/A 04/01/2017   Procedure: RIGHT/LEFT HEART CATH AND CORONARY ANGIOGRAPHY;  Surgeon: Runell GessBerry, Jonathan J, MD;  Location: [facility] INVASIVE CV LAB;  Service: Cardiovascular;  Laterality: N/A;  . TEE WITHOUT CARDIOVERSION N/A 04/04/2017   Procedure: TRANSESOPHAGEAL ECHOCARDIOGRAM (TEE);  Surgeon: Alleen BorneBartle, Bryan K, MD;  [facility];  Service: Open Heart Surgery;  Laterality: N/A;     OB History   No obstetric history on file.      Home Medications    Prior to Admission medications   Medication Sig Start Date End Date Taking? Authorizing Provider  acetaminophen (TYLENOL) 325 MG tablet Take 2 tablets (650 mg total) by mouth every 6 (six) hours as needed for mild pain. 04/09/17   Ardelle BallsZimmerman, Donielle M, PA-C  aspirin EC 81 MG tablet Take 1 tablet (81 mg total) by mouth daily. 08/01/17   Lewayne Buntingrenshaw, Brian S, MD  metoprolol succinate (TOPROL-XL) 25 MG 24 hr tablet Take 0.5 tablets (12.5 mg total) by mouth daily. MUST KEEP APPOINTMENT 01/05/19 WITH DR Jens SomRENSHAW FOR FUTURE REFILLS 10/03/18   Lewayne Buntingrenshaw, Brian S, MD  warfarin (COUMADIN) 5 MG tablet Take 1 to 1 and 1/2 tablets by mouth as directed by coumadin clinic 10/03/18   Lewayne Buntingrenshaw, Brian S, MD    Family History Family History  Problem Relation Age of Onset  . CVA Father   . Heart attack Father     Social History Social History   Tobacco Use  .  Smoking status: Former Smoker    Packs/day: 1.00    Types: Cigarettes    Quit date: 03/30/2017    Years since quitting: 1.6  . Smokeless tobacco: Never Used  Substance Use Topics  . Alcohol use: No    Frequency: Never  . Drug use: No     Allergies   Patient has no known allergies.   Review of Systems Review of Systems  Constitutional: Positive for fatigue. Negative for chills, diaphoresis and fever.  HENT: Negative for congestion.   Eyes: Negative for visual disturbance.  Respiratory: Negative for cough, chest tightness, shortness of breath, wheezing and stridor.   Cardiovascular:  Positive for syncope. Negative for chest pain, palpitations and leg swelling.  Gastrointestinal: Positive for blood in stool and diarrhea. Negative for abdominal pain, anal bleeding, nausea and vomiting.  Genitourinary: Negative for flank pain.  Musculoskeletal: Negative for back pain, neck pain and neck stiffness.  Neurological: Positive for syncope and light-headedness. Negative for dizziness, seizures, facial asymmetry, speech difficulty, weakness, numbness and headaches.  Psychiatric/Behavioral: Negative for agitation.  All other systems reviewed and are negative.    Physical Exam Updated Vital Signs BP 97/60 (BP Location: Right Arm)   Pulse (!) 124   Temp 98.9 F (37.2 C) (Oral)   Resp (!) 22   Ht 5' 6.5" (1.689 m)   Wt 77.1 kg   SpO2 94%   BMI 27.03 kg/m   Physical Exam Vitals signs and nursing note reviewed.  Constitutional:      General: She is not in acute distress.    Appearance: She is well-developed. She is ill-appearing. She is not toxic-appearing or diaphoretic.  HENT:     Head: Normocephalic and atraumatic.     Right Ear: External ear normal.     Left Ear: External ear normal.     Nose: Nose normal. No congestion or rhinorrhea.     Mouth/Throat:     Mouth: Mucous membranes are dry.     Pharynx: No oropharyngeal exudate or posterior oropharyngeal erythema.  Eyes:     Extraocular Movements: Extraocular movements intact.     Conjunctiva/sclera: Conjunctivae normal.     Pupils: Pupils are equal, round, and reactive to light.  Neck:     Musculoskeletal: Normal range of motion and neck supple.  Cardiovascular:     Rate and Rhythm: Regular rhythm. Tachycardia present.     Pulses: Normal pulses.     Heart sounds: Murmur present.  Pulmonary:     Effort: Pulmonary effort is normal. No respiratory distress.     Breath sounds: No stridor. No wheezing, rhonchi or rales.  Chest:     Chest wall: No tenderness.  Abdominal:     General: Abdomen is flat. There is no  distension.     Tenderness: There is no abdominal tenderness. There is no rebound.  Musculoskeletal:        General: No tenderness.     Right lower leg: No edema.     Left lower leg: No edema.  Skin:    General: Skin is warm.     Capillary Refill: Capillary refill takes less than 2 seconds.     Coloration: Skin is pale.     Findings: Bruising (on legs from dog) present. No erythema or rash.  Neurological:     General: No focal deficit present.     Mental Status: She is alert and oriented to person, place, and time.     Cranial Nerves: No cranial nerve deficit.  Sensory: No sensory deficit.     Motor: No weakness or abnormal muscle tone.     Deep Tendon Reflexes: Reflexes are normal and symmetric.  Psychiatric:        Mood and Affect: Mood normal.      ED Treatments / Results  Labs (all labs ordered are listed, but only abnormal results are displayed) Labs Reviewed  CBC WITH DIFFERENTIAL/PLATELET - Abnormal; Notable for the following components:      Result Value   WBC 29.9 (*)    RBC 1.03 (*)    Hemoglobin 3.4 (*)    HCT 10.2 (*)    nRBC 5.4 (*)    Neutro Abs 23.0 (*)    Lymphs Abs 4.2 (*)    Monocytes Absolute 2.4 (*)    All other components within normal limits  COMPREHENSIVE METABOLIC PANEL - Abnormal; Notable for the following components:   Sodium 134 (*)    CO2 20 (*)    Glucose, Bld 137 (*)    BUN 43 (*)    Creatinine, Ser 1.10 (*)    Calcium 7.6 (*)    Total Protein 4.5 (*)    Albumin 2.7 (*)    Alkaline Phosphatase 37 (*)    GFR calc non Af Amer 55 (*)    All other components within normal limits  PROTIME-INR - Abnormal; Notable for the following components:   Prothrombin Time >90.0 (*)    INR >10.0 (*)    All other components within normal limits  LACTIC ACID, PLASMA - Abnormal; Notable for the following components:   Lactic Acid, Venous 4.4 (*)    All other components within normal limits  LACTIC ACID, PLASMA - Abnormal; Notable for the following  components:   Lactic Acid, Venous 3.1 (*)    All other components within normal limits  BRAIN NATRIURETIC PEPTIDE - Abnormal; Notable for the following components:   B Natriuretic Peptide 249.3 (*)    All other components within normal limits  TSH - Abnormal; Notable for the following components:   TSH 6.143 (*)    All other components within normal limits  MAGNESIUM - Abnormal; Notable for the following components:   Magnesium 1.6 (*)    All other components within normal limits  POC OCCULT BLOOD, ED - Abnormal; Notable for the following components:   Fecal Occult Bld POSITIVE (*)    All other components within normal limits  TROPONIN I (HIGH SENSITIVITY) - Abnormal; Notable for the following components:   Troponin I (High Sensitivity) 19 (*)    All other components within normal limits  SARS CORONAVIRUS 2 (HOSPITAL ORDER, [place] [facility])  URINE CULTURE  LIPASE, BLOOD  URINALYSIS, ROUTINE W REFLEX MICROSCOPIC  PATHOLOGIST SMEAR REVIEW  HIV ANTIBODY (ROUTINE TESTING W REFLEX)  CBC  CBC  BASIC METABOLIC PANEL  MAGNESIUM  PHOSPHORUS  PROTIME-INR  LACTIC ACID, PLASMA  LACTIC ACID, PLASMA  CBC  TYPE AND SCREEN  PREPARE RBC (CROSSMATCH)  PREPARE FRESH FROZEN PLASMA  TROPONIN I (HIGH SENSITIVITY)    EKG EKG Interpretation  Date/Time:  Tuesday November 18 2018 17:18:14 EDT Ventricular Rate:  115 PR Interval:    QRS Duration: 91 QT Interval:  329 QTC Calculation: 455 R Axis:   78 Text Interpretation:  Sinus tachycardia Repol abnrm, severe global ischemia (LM/MVD) Baseline wander in lead(s) V3 When compared to prior, T wave inversions and st depression difusely.  No STEMI Confirmed by Antony Blackbird [phone]) on 11/18/2018 5:30:33 PM  Radiology Dg Chest Portable 1 View  Result Date: 11/18/2018 CLINICAL DATA:  Four-day history fatigue, dizziness and syncope. Hypotension and tachycardia upon emergency department arrival. EXAM: PORTABLE CHEST 1 VIEW  COMPARISON:  05/08/2017 and earlier. FINDINGS: Prior sternotomy for aortic and mitral valve replacement. Prior LEFT atrial appendage clipping. Cardiac silhouette upper normal in size, unchanged. Lungs clear. Bronchovascular markings normal. Pulmonary vascularity normal. No visible pleural effusions. No pneumothorax. IMPRESSION: No acute cardiopulmonary disease. Electronically Signed   By: Hulan Saas M.D.   On: 11/18/2018 18:05    Procedures Procedures (including critical care time)  CRITICAL CARE Performed by: Canary Brim Shaleigh Laubscher Total critical care time: 45 minutes Critical care time was exclusive of separately billable procedures and treating other patients. Critical care was necessary to treat or prevent imminent or life-threatening deterioration. Critical care was time spent personally by me on the following activities: development of treatment plan with patient and/or surrogate as well as nursing, discussions with consultants, evaluation of patient's response to treatment, examination of patient, obtaining history from patient or surrogate, ordering and performing treatments and interventions, ordering and review of laboratory studies, ordering and review of radiographic studies, pulse oximetry and re-evaluation of patient's condition.\    Medications Ordered in ED Medications  0.9 %  sodium chloride infusion (Manually program via Guardrails IV Fluids) (has no administration in time range)  magnesium sulfate IVPB 2 g 50 mL (has no administration in time range)  acetaminophen (TYLENOL) tablet 650 mg (has no administration in time range)  pantoprazole (PROTONIX) injection 40 mg (has no administration in time range)  sodium chloride 0.9 % bolus 1,000 mL (0 mLs Intravenous Stopped 11/18/18 2031)  0.9 %  sodium chloride infusion (0 mL/hr Intravenous Stopped 11/18/18 2031)  pantoprazole (PROTONIX) injection 40 mg (40 mg Intravenous Given 11/18/18 1927)  phytonadione (VITAMIN K) 5 mg in  dextrose 5 % 50 mL IVPB (0 mg Intravenous Stopped 11/18/18 2145)     Initial Impression / Assessment and Plan / ED Course  I have reviewed the triage vital signs and the nursing notes.  Pertinent labs & imaging results that were available during my care of the patient were reviewed by me and considered in my medical decision making (see chart for details).        Sharon Figueroa is a 58 y.o. female with a past medical history significant for prior A. fib, mitral valve replacement, aortic valve replacement on Coumadin therapy who presents with multiple syncopal episodes, lightheadedness, fatigue, pallor, dark stools with diarrhea, and hypotension with EMS.  According to patient, she was doing fine last week but then on Saturday, 4 days ago, patient started having dark stools and diarrhea.  She reports is been persistent for the last 4 days.  She denies a history of prior GI bleed however she is on the Coumadin therapy for both prior A. fib and the valve replacements.  She says that she has passed out approximately 3 times over the weekend while trying to get up and go to the bathroom or getting up from the bathroom.  She still feels very lightheaded.  EMS had a low blood pressure of 90 systolic while she was lying flat and when she tried to sit up, they felt that they almost lost pulses.  They were unable to get orthostatics on her as she was symptomatic.  Patient denies any chest pain, palpitations, shortness of breath.  She simply reports extreme fatigue and near syncopal and syncopal episodes.  She denies any urinary  symptoms, fevers, or chills.  She reports her skin looks very pale compared to normal.  She denies history of this.  On exam, patient is very pale and mucous membranes are pale.  Lungs are clear and chest is nontender.  Patient has a systolic murmur.  Abdomen is nontender.  Fecal occult test was performed and was positive.  No hemorrhoids seen.  No focal neurologic deficits.  EKG shows  diffuse global ischemia but appears to be sinus tachycardia.  Given lack of chest pain, shortness breath, palpitations, doubt STEMI at this time.    Clinically, I suspect she is having symptoms and ECG changes due to severe symptomatic anemia from likely GI bleed.   5:49 PM Patient blood pressure dropped into the 70s, will give fluids until hemoglobin has returned.  Will switch to blood if she is anemic as I suspect.  Anticipate admission for symptomatic anemia.  6:24 PM Initial hemoglobin returned at 3.4.  Fecal occult test was positive.  Patient will receive emergency release blood as she is hypotensive with appearance of ischemia on EKG and the symptomatic anemia.  We will give IV Protonix for likely upper GI bleed and will call gastroenterology.  We will stop the other fluids was not to further dilute her.  Patient will require admission for further management of symptomatic anemia and GI bleed.  7:18 PM Other labs have begun to return.  Patient's lactic acid is 4.4 and leukocytosis is present at 29.  Given her lack of fevers, urinary symptoms, cough, or pain, have low suspicion for infection at this time.  I suspect this is all reaction to the symptomatic anemia.  BNP elevated at 249 and TSH is slightly elevated.  INR returned to greater than 10.  Suspect this is contributing to her bleeding.  Patient will be given IV vitamin K per pharmacy recommendation.  Will not do other reversal agents due to the mechanical valve and not wanting to fully reverse her.  Chest x-ray shows no pneumonia.    Patient had some mild chronic back pain which she does not think is due to the blood transfusion currently but thinks is due to positioning in the bed.  She was repositioned and back pain improved.  Patient will continue getting the emergency release blood.  Awaiting gastroenterology recommendations but critical care was called who will come see patient for admission.  7:36 PM Just spoke with the  gastroenterology team from [facility] with Dr. Matthias Hughs who agreed with our management as well as making her n.p.o.  He reports that he will plan to see her in the morning for likely EGD but is on standby overnight if needed emergently.  Awaiting critical care evaluation and admission.    Final Clinical Impressions(s) / ED Diagnoses   Final diagnoses:  Gastrointestinal hemorrhage, unspecified gastrointestinal hemorrhage type  Hemorrhagic shock (HCC)  Elevated INR  Symptomatic anemia  Syncope, unspecified syncope type    Clinical Impression: 1. Gastrointestinal hemorrhage, unspecified gastrointestinal hemorrhage type   2. Hemorrhagic shock (HCC)   3. Elevated INR   4. Symptomatic anemia   5. Syncope, unspecified syncope type     Disposition: Admit  This note was prepared with assistance of Dragon voice recognition software. Occasional wrong-word or sound-a-like substitutions may have occurred due to the inherent limitations of voice recognition software.     Anabia Weatherwax, Canary Brim, MD 11/18/18 2252

## 2018-11-18 NOTE — ED Notes (Signed)
Pt c/o ack pain state this is something she has been dealing with over last 4 days. Dr. Gustavus Messing aware, at bedside and states to continue transfusion.

## 2018-11-18 NOTE — ED Notes (Signed)
ED TO INPATIENT HANDOFF REPORT  ED Nurse Name and Phone #: [phone] Shawna Orleans, RN  S Name/Age/Gender Sharon Figueroa 58 y.o. female Room/Bed: 034C/034C  Code Status   Code Status: Full Code  Home/SNF/Other Home Patient oriented to: self, place, time and situation Is this baseline? Yes   Triage Complete: Triage complete  Chief Complaint EKG Changes; Hypotensive; GI Bleed  Triage Note Pt c/o dark colored diarrhea and dizziness x 4 days. On coumadin. Hx of open heart and valve replace , mitral and aortic. 900 cc ns PTA. bp decreased with sitting up, lost radial pulses. Pt staets she has passed out 3 times over last days.   Allergies No Known Allergies  Level of Care/Admitting Diagnosis ED Disposition    ED Disposition Condition Comment   Admit  Hospital Area: [facility] [[id number]]  Level of Care: ICU [6]  Covid Evaluation: Asymptomatic Screening Protocol (No Symptoms)  Diagnosis: GI bleed [[id number]]  Admitting Physician: Ples Specter [[id number]]  Attending Physician: Ples Specter [[id number]]  Estimated length of stay: 3 - 4 days  Certification:: I certify this patient will need inpatient services for at least 2 midnights  PT Class (Do Not Modify): Inpatient [101]  PT Acc Code (Do Not Modify): Private [1]       B Medical/Surgery History Past Medical History:  Diagnosis Date  . Mitral valve disorder    "leaky" valve diagnosed 30+ yr ago   Past Surgical History:  Procedure Laterality Date  . AORTIC VALVE REPLACEMENT N/A 04/04/2017   Procedure: AORTIC VALVE REPLACEMENT (AVR);  Surgeon: Alleen Borne, MD;  Location[facility];  Service: Open Heart Surgery;  Laterality: N/A;  . CARDIAC SURGERY    . MAZE N/A 04/04/2017   Procedure: MAZE;  Surgeon: Alleen Borne, MD;  [facility];  Service: Open Heart Surgery;  Laterality: N/A;  . MITRAL VALVE REPLACEMENT N/A 04/04/2017   Procedure: MITRAL VALVE (MV) REPLACEMENT;  Surgeon: Alleen Borne, MD;  Location:  [facility] OR;  Service: Open Heart Surgery;  Laterality: N/A;  . No prior surgery    . RIGHT/LEFT HEART CATH AND CORONARY ANGIOGRAPHY N/A 04/01/2017   Procedure: RIGHT/LEFT HEART CATH AND CORONARY ANGIOGRAPHY;  Surgeon: Runell Gess, MD;  [facility]: [facility] INVASIVE CV LAB;  Service: Cardiovascular;  Laterality: N/A;  . TEE WITHOUT CARDIOVERSION N/A 04/04/2017   Procedure: TRANSESOPHAGEAL ECHOCARDIOGRAM (TEE);  Surgeon: Alleen Borne, MD;  Location: [facility] OR;  Service: Open Heart Surgery;  Laterality: N/A;     A IV Location/Drains/Wounds Patient Lines/Drains/Airways Status   Active Line/Drains/Airways    Name:   Placement date:   Placement time:   Site:   Days:   Peripheral IV 11/18/18 Left Antecubital   11/18/18    1720    Antecubital   less than 1   Peripheral IV 11/18/18 Right Antecubital   11/18/18    1753    Antecubital   less than 1   Peripheral IV 11/18/18 Left Forearm   11/18/18    2030    Forearm   less than 1   Incision (Closed) 04/04/17 Sternum Anterior;Mid   04/04/17    0917     593          Intake/Output Last 24 hours  Intake/Output Summary (Last 24 hours) at 11/18/2018 2105 Last data filed at 11/18/2018 2029 Gross per 24 hour  Intake 315 ml  Output -  Net 315 ml    Labs/Imaging Results for orders placed or  performed during the hospital encounter of 11/18/18 (from the past 48 hour(s))  Type and screen [facility]     Status: None (Preliminary result)   Collection Time: 11/18/18  5:26 PM  Result Value Ref Range   ABO/RH(D) A POS    Antibody Screen NEG    Sample Expiration 11/21/2018,2359    Unit Number [id number]    Blood Component Type RED CELLS,LR    Unit division 00    Status of Unit ISSUED    Transfusion Status OK TO TRANSFUSE    Crossmatch Result Compatible    Unit Number [id number]    Blood Component Type RED CELLS,LR    Unit division 00    Status of Unit ISSUED    Transfusion Status OK TO TRANSFUSE    Crossmatch Result Compatible     Unit Number [id number]    Blood Component Type RBC LR PHER1    Unit division 00    Status of Unit ISSUED    Unit tag comment VERBAL ORDERS PER DR TEGELER    Transfusion Status OK TO TRANSFUSE    Crossmatch Result COMPATIBLE   CBC with Differential     Status: Abnormal   Collection Time: 11/18/18  5:31 PM  Result Value Ref Range   WBC 29.9 (H) 4.0 - 10.5 K/uL    Comment: REPEATED TO VERIFY WHITE COUNT CONFIRMED ON SMEAR    RBC 1.03 (L) 3.87 - 5.11 MIL/uL   Hemoglobin 3.4 (LL) 12.0 - 15.0 g/dL    Comment: REPEATED TO VERIFY RESULTS ACCEPTED BY K.COBBS RN THIS CRITICAL RESULT HAS VERIFIED AND BEEN CALLED TO K.COBBS RN BY KATHERINE MCCORMICK ON 09 15 2020 AT 1817, AND HAS BEEN READ BACK.     HCT 10.2 (L) 36.0 - 46.0 %   MCV 99.0 80.0 - 100.0 fL   MCH 33.0 26.0 - 34.0 pg   MCHC 33.3 30.0 - 36.0 g/dL   RDW 53.615.1 64.411.5 - 03.415.5 %   Platelets 239 150 - 400 K/uL    Comment: REPEATED TO VERIFY   nRBC 5.4 (H) 0.0 - 0.2 %   Neutrophils Relative % 77 %   Neutro Abs 23.0 (H) 1.7 - 7.7 K/uL   Lymphocytes Relative 14 %   Lymphs Abs 4.2 (H) 0.7 - 4.0 K/uL   Monocytes Relative 8 %   Monocytes Absolute 2.4 (H) 0.1 - 1.0 K/uL   Eosinophils Relative 1 %   Eosinophils Absolute 0.3 0.0 - 0.5 K/uL   Basophils Relative 0 %   Basophils Absolute 0.0 0.0 - 0.1 K/uL   Abs Immature Granulocytes 0.00 0.00 - 0.07 K/uL   Polychromasia PRESENT     Comment: Performed at [facility], [address] [place], [place] [address]  Comprehensive metabolic panel     Status: Abnormal   Collection Time: 11/18/18  5:31 PM  Result Value Ref Range   Sodium 134 (L) 135 - 145 mmol/L   Potassium 3.7 3.5 - 5.1 mmol/L   Chloride 105 98 - 111 mmol/L   CO2 20 (L) 22 - 32 mmol/L   Glucose, Bld 137 (H) 70 - 99 mg/dL   BUN 43 (H) 6 - 20 mg/dL   Creatinine, Ser 5.631.10 (H) 0.44 - 1.00 mg/dL   Calcium 7.6 (L) 8.9 - 10.3 mg/dL   Total Protein 4.5 (L) 6.5 - 8.1 g/dL   Albumin 2.7 (L) 3.5 - 5.0 g/dL   AST 17 15 - 41 U/L    ALT 12  0 - 44 U/L   Alkaline Phosphatase 37 (L) 38 - 126 U/L   Total Bilirubin 0.5 0.3 - 1.2 mg/dL   GFR calc non Af Amer 55 (L) >60 mL/min   GFR calc Af Amer >60 >60 mL/min   Anion gap 9 5 - 15    Comment: Performed at [facility] [address] [place], [place] [address]  Protime-INR     Status: Abnormal   Collection Time: 11/18/18  5:31 PM  Result Value Ref Range   Prothrombin Time >90.0 (H) 11.4 - 15.2 seconds   INR >10.0 (HH) 0.8 - 1.2    Comment: REPEATED TO VERIFY CRITICAL RESULT CALLED TO, READ BACK BY AND VERIFIED WITH: M.GAGE,RN [phone] [id number] I.MANNING (NOTE) INR goal varies based on device and disease states. Performed at [facility], [place] [address] [place], [place] [address]   Troponin I (High Sensitivity)     Status: None   Collection Time: 11/18/18  5:31 PM  Result Value Ref Range   Troponin I (High Sensitivity) 16 <18 ng/L    Comment: (NOTE) Elevated high sensitivity troponin I (hsTnI) values and significant  changes across serial measurements may suggest ACS but many other  chronic and acute conditions are known to elevate hsTnI results.  Refer to the "Links" section for chest pain algorithms and additional  guidance. Performed at [facility], [place] [address], [place], [place] [address]   Lipase, blood     Status: None   Collection Time: 11/18/18  5:31 PM  Result Value Ref Range   Lipase 31 11 - 51 U/L    Comment: Performed at [facility], [place] [address], [place], [place] [address]  Magnesium     Status: Abnormal   Collection Time: 11/18/18  5:31 PM  Result Value Ref Range   Magnesium 1.6 (L) 1.7 - 2.4 mg/dL    Comment: Performed at [facility] [address], [place], [place] [address]  Lactic acid, plasma     Status: Abnormal   Collection Time: 11/18/18  5:32 PM  Result Value Ref Range   Lactic Acid, Venous 4.4 (HH) 0.5 - 1.9 mmol/L    Comment: CRITICAL RESULT CALLED TO, READ BACK BY AND VERIFIED WITH: Haywood Filler [id number] 11/18/2018 WBOND Performed at [facility], [place] [address] [place], [place] [address]   Brain natriuretic peptide     Status: Abnormal   Collection Time: 11/18/18  5:32 PM  Result Value Ref Range   B Natriuretic Peptide 249.3 (H) 0.0 - 100.0 pg/mL    Comment: Performed at [facility] [address], [place], [place] [address]  TSH     Status: Abnormal   Collection Time: 11/18/18  5:32 PM  Result Value Ref Range   TSH 6.143 (H) 0.350 - 4.500 uIU/mL    Comment: Performed by a 3rd Generation assay with a functional sensitivity of <=0.01 uIU/mL. Performed at [facility], [place] [address], [place], [place] [address]   POC occult blood, ED     Status: Abnormal   Collection Time: 11/18/18  5:36 PM  Result Value Ref Range   Fecal Occult Bld POSITIVE (A) NEGATIVE  Prepare RBC     Status: None   Collection Time: 11/18/18  6:15 PM  Result Value Ref Range   Order Confirmation      ORDER PROCESSED BY BLOOD BANK Performed at [facility], [place]  [address] [place], [place] [address]   Lactic acid, plasma     Status: Abnormal   Collection Time: 11/18/18  7:32 PM  Result Value Ref Range   Lactic Acid, Venous 3.1 (HH) 0.5 - 1.9 mmol/L    Comment: CRITICAL VALUE NOTED.  VALUE IS CONSISTENT WITH PREVIOUSLY REPORTED AND CALLED VALUE. Performed at [facility], [address] [place], [place] [address]   Troponin I (High Sensitivity)     Status: Abnormal   Collection Time: 11/18/18  7:32 PM  Result Value Ref Range   Troponin I (High Sensitivity) 19 (H) <18 ng/L    Comment: (NOTE) Elevated high sensitivity troponin I (hsTnI) values and significant  changes across serial measurements may suggest ACS but many other  chronic and acute conditions are known to elevate hsTnI results.  Refer to the "Links" section for chest pain algorithms and additional  guidance. Performed at [facility], [address] [place], [place] [address]   Prepare fresh  frozen plasma     Status: None (Preliminary result)   Collection Time: 11/18/18  7:50 PM  Result Value Ref Range   Unit Number [id number]    Blood Component Type THAWED PLASMA    Unit division 00    Status of Unit ISSUED    Transfusion Status      OK TO TRANSFUSE Performed at [facility], [address] [place], [place] [address]    Unit Number [id number]    Blood Component Type THAWED PLASMA    Unit division 00    Status of Unit ALLOCATED    Transfusion Status OK TO TRANSFUSE    Dg Chest Portable 1 View  Result Date: 11/18/2018 CLINICAL DATA:  Four-day history fatigue, dizziness and syncope. Hypotension and tachycardia upon emergency department arrival. EXAM: PORTABLE CHEST 1 VIEW COMPARISON:  05/08/2017 and earlier. FINDINGS: Prior sternotomy for aortic and mitral valve replacement. Prior LEFT atrial appendage clipping. Cardiac silhouette upper normal in size, unchanged. Lungs clear. Bronchovascular markings normal. Pulmonary vascularity normal. No visible pleural effusions. No pneumothorax. IMPRESSION: No acute cardiopulmonary disease. Electronically Signed   By: Hulan Saashomas  Lawrence M.D.   On: 11/18/2018 18:05    Pending Labs Unresulted Labs (From admission, onward)    Start     Ordered   11/19/18 0500  Basic metabolic panel  Tomorrow morning,   R     11/18/18 2008   11/19/18 0500  Magnesium  Tomorrow morning,   R     11/18/18 2008   11/19/18 0500  Phosphorus  Tomorrow morning,   R     11/18/18 2008   11/19/18 0500  Protime-INR  Daily,   R     11/18/18 2055   11/19/18 0001  Protime-INR  Tomorrow morning,   R     11/18/18 2017   11/18/18 2200  CBC  Now then every 6 hours,   R (with STAT occurrences)     11/18/18 2008   11/18/18 2100  Lactic acid, plasma  STAT Now then every 3 hours,   R (with STAT occurrences)     11/18/18 2017   11/18/18 2037  SARS Coronavirus 2 [facility](Hospital order, Performed in [facility] hospital lab) Nasopharyngeal Nasopharyngeal Swab  (COVID  Labs)  Once,   STAT    Question Answer Comment  Is this test for diagnosis or screening Screening   Symptomatic for COVID-19 as defined by CDC No   Hospitalized for COVID-19 No   Admitted to ICU for  COVID-19 No   Previously tested for COVID-19 No   Resident in a congregate (group) care setting No   Employed in healthcare setting No   Pregnant No   Pre-procedural testing Yes      11/18/18 2036   11/18/18 2005  HIV antibody (Routine Testing)  Once,   STAT     11/18/18 2008   11/18/18 1732  Urinalysis, Routine w reflex microscopic  Once,   STAT     11/18/18 1731   11/18/18 1732  Urine culture  ONCE - STAT,   STAT     11/18/18 1731   11/18/18 1731  Pathologist smear review  Once,   STAT     11/18/18 1731          Vitals/Pain Today's Vitals   11/18/18 2029 11/18/18 2030 11/18/18 2045 11/18/18 2101  BP: (!) 93/39 (!) 89/52 (!) 94/57 93/68  Pulse: (!) 108  (!) 106 (!) 103  Resp: (!) 23 (!) 21 (!) 21 (!) 25  Temp: 99.2 F (37.3 C)  98.7 F (37.1 C) 99.1 F (37.3 C)  TempSrc: Oral  Oral Oral  SpO2:   100% 100%  Weight:      Height:      PainSc:        Isolation Precautions No active isolations  Medications Medications  phytonadione (VITAMIN K) 5 mg in dextrose 5 % 50 mL IVPB (5 mg Intravenous New Bag/Given 11/18/18 2036)  0.9 %  sodium chloride infusion (Manually program via Guardrails IV Fluids) (has no administration in time range)  magnesium sulfate IVPB 2 g 50 mL (has no administration in time range)  acetaminophen (TYLENOL) tablet 650 mg (has no administration in time range)  pantoprazole (PROTONIX) injection 40 mg (has no administration in time range)  sodium chloride 0.9 % bolus 1,000 mL (0 mLs Intravenous Stopped 11/18/18 2031)  0.9 %  sodium chloride infusion (0 mL/hr Intravenous Stopped 11/18/18 2031)  pantoprazole (PROTONIX) injection 40 mg (40 mg Intravenous Given 11/18/18 1927)    Mobility walks Moderate fall risk   Focused Assessments Cardiac Assessment  Handoff:  Cardiac Rhythm: Normal sinus rhythm No results found for: CKTOTAL, CKMB, CKMBINDEX, TROPONINI No results found for: DDIMER Does the Patient currently have chest pain? No      R Recommendations: See Admitting Provider Note  Report given to:   Additional Notes:

## 2018-11-18 NOTE — ED Triage Notes (Signed)
Pt c/o dark colored diarrhea and dizziness x 4 days. On coumadin. Hx of open heart and valve replace , mitral and aortic. 900 cc ns PTA. bp decreased with sitting up, lost radial pulses. Pt staets she has passed out 3 times over last days.

## 2018-11-18 NOTE — ED Notes (Signed)
Dr. Gustavus Messing at bedside and aware of vs. Blood rate increased to 122ml/h

## 2018-11-18 NOTE — H&P (Signed)
NAME:  Sharon Figueroa, MRN:  [id number], DOB:  [birth year], LOS: 0 ADMISSION DATE:  11/18/2018, CONSULTATION DATE:  11/18/2018 REFERRING MD:  Emergency Department, CHIEF COMPLAINT:  GI bleed   Brief History   58 yo F with a history of aortic and mitral mechanical valve replacements on warfarin who presented with dark stools, and hypotension and hemoglobin of 3.4 and INR >10.    History of present illness   Patient is normally on warfarin with goal of 3-3.5 for her mechanical valve replacements (history of rheumatic fever as a child).  She started having profuse diarrhea with black stools 3 days ago on Saturday.  No vomiting, and no pain, but did have decreased appetite and did not eat much.  She kept taking her medications- warfarin, ASA 81, and metoprolol.  She denies any NSAIDs, denies history of ulcers or GI bleed in the past.  No infectious symptoms- no fevers, chills, cough, shortness of breath.  She did pass out 3 times in the last couple days, but denies ever hitting her head.  She is very lightheaded with sitting up and standing up, but no lightheadedness with lying flat.    In the ED, prior to me seeing her, she was hypotensive in the 70s/50s, mentating fine laying down.  She had received 1 unit PRBCs in addition to 2L IV fluids.  BP was starting to improve- up to 90s/50s after receiving the unit.  GI was called by the ED, and are planning to see patient in the AM.    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 rate (!) 23, height 5' 6.5" (1.689 m), weight 77.1 kg, SpO2 100 %.       No intake or output data in the 24 hours  ending 11/18/18 2020 Filed Weights   11/18/18 1726  Weight: 77.1 kg    Examination: General: mentating fine, pale, looks state age HENT: no abnormalities Lungs: clear bilaterally no crackles, wheezes Cardiovascular: mechanical click, HR 110.   Abdomen: soft, mildly tender to palpation in epigastric area and LUQ Extremities: no edema, no obvious bruising Neuro: AAOx3, conversant GU: no abnormalities  Resolved Hospital Problem list     Assessment & Plan:  58 yo F with acute blood loss anemia due to GI bleed, and supratherapeutic INR.    # Shock- hemorrhagic # Acute blood loss anemia # GI bleed, likely upper GI bleed Shock improved with blood transfusion. Has 2 18gauge PIVs, and 1 22G PIV.  S/p resuscitation with 2L IVFs, 1u emergency release PRBCs so far, and 2 additional units PRBCs ordered.  Source is likely upper GI source, and bleeding in the setting of supratherapeutic INR.  Supratherapeuitc INR could be from lack of vitamin K stores with depressed appetite.   - FFP 2units - IV vitamin K 5mg  - ppi BID - GI consulted- plan to see in the AM.  Will reengage if does not stabilize hemodynamically - NPO for likely EGD tomorrow - trend lactate - trend INR - trend CBC q6H  # History aortic and mitral mechanical valves # Chronic warfarin use # Supratherapeutic INR - IV vitamin K  and FFP 2u ordered.   - trend INR - hold warfarin - hold metoprolol - hold ASA for now  Best practice:  Diet: NPO Pain/Anxiety/Delirium protocol (if indicated): N/A VAP protocol (if indicated): n/a DVT prophylaxis: SCDs (active bleeding) GI prophylaxis: ppi Glucose control: q4h glucose checks Mobility: n/a Code Status: Full Family Communication: fiance at bedside Disposition:   Labs   CBC: Recent Labs  Lab 11/18/18 1731  WBC 29.9*  NEUTROABS 23.0*  HGB 3.4*  HCT 10.2*  MCV 99.0  PLT 239    Basic Metabolic Panel: Recent Labs  Lab 11/18/18 1731  NA 134*  K 3.7  CL 105  CO2  20*  GLUCOSE 137*  BUN 43*  CREATININE 1.10*  CALCIUM 7.6*  MG 1.6*   GFR: Estimated Creatinine Clearance: 57.6 mL/min (A) (by C-G formula based on SCr of 1.1 mg/dL (H)). Recent Labs  Lab 11/18/18 1731 11/18/18 1732  WBC 29.9*  --   LATICACIDVEN  --  4.4*    Liver Function Tests: Recent Labs  Lab 11/18/18 1731  AST 17  ALT 12  ALKPHOS 37*  BILITOT 0.5  PROT 4.5*  ALBUMIN 2.7*   Recent Labs  Lab 11/18/18 1731  LIPASE 31   No results for input(s): AMMONIA in the last 168 hours.  ABG    Component Value Date/Time   PHART 7.375 04/04/2017 2009   PCO2ART 31.8 (L) 04/04/2017 2009   PO2ART 55.0 (L) 04/04/2017 2009   HCO3 18.7 (L) 04/04/2017 2009   TCO2 27 04/06/2017 1551   ACIDBASEDEF 6.0 (H) 04/04/2017 2009   O2SAT 89.0 04/04/2017 2009     Coagulation Profile: Recent Labs  Lab 11/18/18 1731  INR >10.0*    Cardiac Enzymes: No results for input(s): CKTOTAL, CKMB, CKMBINDEX, TROPONINI in the last 168 hours.  HbA1C: Hgb A1c MFr Bld  Date/Time Value Ref Range Status  03/30/2017 05:21 PM 5.3 4.8 - 5.6 % Final    Comment:    (NOTE) Pre diabetes:          5.7%-6.4% Diabetes:              >6.4% Glycemic control for   <7.0% adults with diabetes     CBG: No results for input(s): GLUCAP in the last 168 hours.  Review of Systems:   ROS negative except for above in HPI  Past Medical History  She,  has a past medical history of Mitral valve disorder.   Surgical History    Past Surgical History:  Procedure Laterality Date  . AORTIC VALVE REPLACEMENT N/A 04/04/2017   Procedure: AORTIC VALVE REPLACEMENT (AVR);  Surgeon: Alleen BorneBartle, Bryan K, MD;  [facility];  Service: Open Heart Surgery;  Laterality: N/A;  . CARDIAC SURGERY    . MAZE N/A 04/04/2017   Procedure: MAZE;  Surgeon: Alleen BorneBartle, Bryan K, MD;  [facility];  Service: Open Heart Surgery;  Laterality: N/A;  . MITRAL VALVE REPLACEMENT N/A 04/04/2017   Procedure: MITRAL VALVE (MV) REPLACEMENT;   Surgeon: Alleen BorneBartle, Bryan K, MD;  Location: [facility] OR;  Service: Open Heart Surgery;  Laterality: N/A;  . No prior surgery    . RIGHT/LEFT HEART CATH AND CORONARY ANGIOGRAPHY N/A 04/01/2017   Procedure: RIGHT/LEFT HEART CATH AND CORONARY ANGIOGRAPHY;  Surgeon: Runell GessBerry, Jonathan J, MD;  [facility] INV[facility] LAB;  Service: Cardiovascular;  Laterality: N/A;  . TEE WITHOUT CARDIOVERSION N/A 04/04/2017   Procedure: TRANSESOPHAGEAL ECHOCARDIOGRAM (TEE);  Surgeon: Alleen BorneBartle, Bryan K, MD;  [facility];  Service:  Open Heart Surgery;  Laterality: N/A;     Social History   reports that she quit smoking about 19 months ago. Her smoking use included cigarettes. She smoked 1.00 pack per day. She has never used smokeless tobacco. She reports that she does not drink alcohol or use drugs.   Family History   Her family history includes CVA in her father; Heart attack in her father.   Allergies No Known Allergies   Home Medications  Prior to Admission medications   Medication Sig Start Date End Date Taking? Authorizing Provider  acetaminophen (TYLENOL) 325 MG tablet Take 2 tablets (650 mg total) by mouth every 6 (six) hours as needed for mild pain. 04/09/17   Nani Skillern, PA-C  aspirin EC 81 MG tablet Take 1 tablet (81 mg total) by mouth daily. 08/01/17   Lelon Perla, MD  metoprolol succinate (TOPROL-XL) 25 MG 24 hr tablet Take 0.5 tablets (12.5 mg total) by mouth daily. MUST KEEP APPOINTMENT 01/05/19 WITH DR Stanford Breed FOR FUTURE REFILLS 10/03/18   Lelon Perla, MD  warfarin (COUMADIN) 5 MG tablet Take 1 to 1 and 1/2 tablets by mouth as directed by coumadin clinic 10/03/18   Lelon Perla, MD     Critical care time: 60 minutes.

## 2018-11-19 ENCOUNTER — Inpatient Hospital Stay (HOSPITAL_COMMUNITY): Payer: BC Managed Care – PPO

## 2018-11-19 DIAGNOSIS — I342 Nonrheumatic mitral (valve) stenosis: Secondary | ICD-10-CM

## 2018-11-19 DIAGNOSIS — I361 Nonrheumatic tricuspid (valve) insufficiency: Secondary | ICD-10-CM

## 2018-11-19 LAB — PREPARE FRESH FROZEN PLASMA
Unit division: 0
Unit division: 0

## 2018-11-19 LAB — BASIC METABOLIC PANEL
Anion gap: 8 (ref 5–15)
Anion gap: 7 (ref 5–15)
BUN: 24 mg/dL — ABNORMAL HIGH (ref 6–20)
BUN: 15 mg/dL (ref 6–20)
CO2: 21 mmol/L — ABNORMAL LOW (ref 22–32)
CO2: 25 mmol/L (ref 22–32)
Calcium: 7.8 mg/dL — ABNORMAL LOW (ref 8.9–10.3)
Calcium: 7.9 mg/dL — ABNORMAL LOW (ref 8.9–10.3)
Chloride: 108 mmol/L (ref 98–111)
Chloride: 106 mmol/L (ref 98–111)
Creatinine, Ser: 0.68 mg/dL (ref 0.44–1.00)
Creatinine, Ser: 0.61 mg/dL (ref 0.44–1.00)
GFR calc Af Amer: >60 mL/min (ref >60)
GFR calc Af Amer: >60 mL/min (ref >60)
GFR calc non Af Amer: >60 mL/min (ref >60)
GFR calc non Af Amer: >60 mL/min (ref >60)
Glucose, Bld: 122 mg/dL — ABNORMAL HIGH (ref 70–99)
Glucose, Bld: 104 mg/dL — ABNORMAL HIGH (ref 70–99)
Potassium: 3.4 mmol/L — ABNORMAL LOW (ref 3.5–5.1)
Potassium: 3.7 mmol/L (ref 3.5–5.1)
Sodium: 137 mmol/L (ref 135–145)
Sodium: 138 mmol/L (ref 135–145)

## 2018-11-19 LAB — BPAM FFP
Blood Product Expiration Date: 202009192359
Blood Product Expiration Date: 202009192359
ISSUE DATE / TIME: 202009152013
Unit Type and Rh: 600
Unit Type and Rh: 6200

## 2018-11-19 LAB — HEMOGLOBIN AND HEMATOCRIT, BLOOD
HCT: 21.9 % — ABNORMAL LOW (ref 36.0–46.0)
HCT: 21.1 % — ABNORMAL LOW (ref 36.0–46.0)
HCT: 22.1 % — ABNORMAL LOW (ref 36.0–46.0)
Hemoglobin: 7.9 g/dL — ABNORMAL LOW (ref 12.0–15.0)
Hemoglobin: 7.6 g/dL — ABNORMAL LOW (ref 12.0–15.0)
Hemoglobin: 7.5 g/dL — ABNORMAL LOW (ref 12.0–15.0)

## 2018-11-19 LAB — CBC
HCT: 20.9 % — ABNORMAL LOW (ref 36.0–46.0)
Hemoglobin: 7.4 g/dL — ABNORMAL LOW (ref 12.0–15.0)
MCH: 32.3 pg (ref 26.0–34.0)
MCHC: 35.4 g/dL (ref 30.0–36.0)
MCV: 91.3 fL (ref 80.0–100.0)
Platelets: 171 10*3/uL (ref 150–400)
RBC: 2.29 MIL/uL — ABNORMAL LOW (ref 3.87–5.11)
RDW: 13.9 % (ref 11.5–15.5)
WBC: 18.3 10*3/uL — ABNORMAL HIGH (ref 4.0–10.5)
nRBC: 4.6 % — ABNORMAL HIGH (ref 0.0–0.2)

## 2018-11-19 LAB — PATHOLOGIST SMEAR REVIEW

## 2018-11-19 LAB — MRSA PCR SCREENING: MRSA by PCR: NEGATIVE

## 2018-11-19 LAB — ECHOCARDIOGRAM COMPLETE
Height: 66.5 in
Weight: 2874.8 oz

## 2018-11-19 LAB — PHOSPHORUS
Phosphorus: 2.2 mg/dL — ABNORMAL LOW (ref 2.5–4.6)
Phosphorus: 1.6 mg/dL — ABNORMAL LOW (ref 2.5–4.6)

## 2018-11-19 LAB — GLUCOSE, CAPILLARY
Glucose-Capillary: 112 mg/dL — ABNORMAL HIGH (ref 70–99)
Glucose-Capillary: 119 mg/dL — ABNORMAL HIGH (ref 70–99)
Glucose-Capillary: 122 mg/dL — ABNORMAL HIGH (ref 70–99)
Glucose-Capillary: 91 mg/dL (ref 70–99)
Glucose-Capillary: 93 mg/dL (ref 70–99)
Glucose-Capillary: 99 mg/dL (ref 70–99)

## 2018-11-19 LAB — HEPARIN LEVEL (UNFRACTIONATED): Heparin Unfractionated: 0.39 IU/mL (ref 0.30–0.70)

## 2018-11-19 LAB — PROTIME-INR
INR: 1.4 — ABNORMAL HIGH (ref 0.8–1.2)
Prothrombin Time: 17.2 seconds — ABNORMAL HIGH (ref 11.4–15.2)

## 2018-11-19 LAB — T4, FREE: Free T4: 1.11 ng/dL (ref 0.61–1.12)

## 2018-11-19 LAB — LACTIC ACID, PLASMA: Lactic Acid, Venous: 1.8 mmol/L (ref 0.5–1.9)

## 2018-11-19 LAB — MAGNESIUM
Magnesium: 2.4 mg/dL (ref 1.7–2.4)
Magnesium: 2.2 mg/dL (ref 1.7–2.4)

## 2018-11-19 LAB — HIV ANTIBODY (ROUTINE TESTING W REFLEX): HIV Screen 4th Generation wRfx: NONREACTIVE

## 2018-11-19 MED ORDER — SODIUM CHLORIDE 0.9 % IV SOLN: INTRAVENOUS | Status: AC | PRN

## 2018-11-19 MED ORDER — POTASSIUM CHLORIDE 10 MEQ/100ML IV SOLN
10.0000 meq | INTRAVENOUS | Status: AC
  Administered 2018-11-19 (×2): 10 meq via INTRAVENOUS
  Filled 2018-11-19: qty 100

## 2018-11-19 MED ORDER — POTASSIUM CHLORIDE 10 MEQ/100ML IV SOLN: 10.0000 meq | INTRAVENOUS | Status: DC

## 2018-11-19 MED ORDER — SODIUM CHLORIDE 0.9 % IV SOLN
INTRAVENOUS | Status: DC | PRN
  Administered 2018-11-19: 250 mL via INTRAVENOUS

## 2018-11-19 MED ORDER — LACTATED RINGERS IV SOLN
INTRAVENOUS | Status: DC
  Administered 2018-11-19 – 2018-11-20 (×2): via INTRAVENOUS
  Administered 2018-11-20: 1000 mL via INTRAVENOUS
  Administered 2018-11-20 (×2): via INTRAVENOUS

## 2018-11-19 MED ORDER — SODIUM CHLORIDE 0.9 % IV SOLN: INTRAVENOUS | Status: DC | PRN

## 2018-11-19 MED ORDER — HEPARIN (PORCINE) 25000 UT/250ML-% IV SOLN
950.0000 [IU]/h | INTRAVENOUS | Status: DC
  Administered 2018-11-19: 950 [IU]/h via INTRAVENOUS
  Filled 2018-11-19: qty 250

## 2018-11-19 MED ORDER — HEPARIN (PORCINE) 25000 UT/250ML-% IV SOLN: 950.0000 [IU]/h | INTRAVENOUS | Status: AC

## 2018-11-19 MED ORDER — POTASSIUM PHOSPHATES 15 MMOLE/5ML IV SOLN
30.0000 mmol | Freq: Once | INTRAVENOUS | Status: AC
  Administered 2018-11-19: 30 mmol via INTRAVENOUS
  Filled 2018-11-19: qty 10

## 2018-11-19 NOTE — H&P (View-Only) (Signed)
[facility] CONS[facility] Reason for consult:GIL bleeding  Referring Physician: [facility] hospitalist.  PCP: None.  Primary GI: Sharon Figueroa is an 58 y.o. female.  HPI: She has a long history of cardiac valvular disease due to rheumatic heart disease.  2/19 she underwent AVR and MVR along with M AZE procedure for A. fib by Dr. Virgel Gess.  She has been on Coumadin ever since and is followed by Dr. Jens Som.  The patient is never had a colonoscopy, has never had any problems with reflux or GI bleeding.  She came to the emergency room after several days of loose dark stool and progressive weakness.  She never had abdominal pain.  Her INR was greater than 10 yesterday is down to 1.4 today after vitamin K and FFP.Hemoglobin was down to 3.4 and has increased to 7.9 with transfusion.  The patient is currently not on any anticoagulation.  Her stools were positive and BUN was slightly elevated at 24.  No family history of colon cancer, or any other GI cancer.  Past Medical History:  Diagnosis Date  . 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 AORTIC VALVE REPLACEMENT N/A 04/04/2017   Procedure: AORTIC VALVE REPLACEMENT (AVR);  Surgeon: Alleen Borne, MD;  Location: [facility] OR;  Service: Open Heart Surgery;  Laterality: N/A;  . CARDIAC SURGERY    . MAZE N/A 04/04/2017   Procedure: MAZE;  Surgeon: Alleen Borne, MD;  Location: [facility] OR;  Service: Open Heart Surgery;  Laterality: N/A;  . MITRAL VALVE REPLACEMENT N/A 04/04/2017   Procedure: MITRAL VALVE (MV) REPLACEMENT;  Surgeon: Alleen Borne, MD;  Location: [facility] OR;  Service: Open Heart Surgery;  Laterality: N/A;  . No prior surgery    . RIGHT/LEFT HEART CATH AND CORONARY ANGIOGRAPHY N/A 04/01/2017   Procedure: RIGHT/LEFT HEART CATH AND CORONARY ANGIOGRAPHY;  Surgeon: Runell Gess, MD;  Location: [facility] INVASIVE CV LAB;  Service: Cardiovascular;  Laterality: N/A;  . TEE  WITHOUT CARDIOVERSION N/A 04/04/2017   Procedure: TRANSESOPHAGEAL ECHOCARDIOGRAM (TEE);  Surgeon: Alleen Borne, MD;  Location: [facility] OR;  Service: Open Heart Surgery;  Laterality: N/A;    Family History  Problem Relation Age of Onset  . CVA Father   . Heart attack Father     Social History:  reports that she quit smoking about 19 months ago. Her smoking use included cigarettes. She smoked 1.00 pack per day. She has never used smokeless tobacco. She reports that she does not drink alcohol or use drugs.  Allergies: No Known Allergies  Medications; Prior to Admission medications   Medication Sig Start Date End Date Taking? Authorizing Provider  acetaminophen (TYLENOL) 325 MG tablet Take 2 tablets (650 mg total) by mouth every 6 (six) hours as needed for mild pain. 04/09/17  Yes Doree Fudge M, PA-C  aspirin EC 81 MG tablet Take 1 tablet (81 mg total) by mouth daily. 08/01/17  Yes Lewayne Bunting, MD  metoprolol succinate (TOPROL-XL) 25 MG 24 hr tablet Take 0.5 tablets (12.5 mg total) by mouth daily. MUST KEEP APPOINTMENT 01/05/19 WITH DR Jens Som FOR FUTURE REFILLS Patient taking differently: Take 12.5 mg by mouth daily.  10/03/18  Yes Lewayne Bunting, MD  warfarin (COUMADIN) 5 MG tablet Take 1 to 1 and 1/2 tablets by mouth as directed by coumadin clinic Patient taking differently: Take 5-7.5 mg by mouth daily after supper. Take 5 mg  by mouth after supper (evening meal) on Sun/Tues/Thurs/Sat and 7.5 mg on Mon/Wed/Fri 10/03/18  Yes Crenshaw, Madolyn Frieze, MD   . sodium chloride   Intravenous Once  . Chlorhexidine Gluconate Cloth  6 each Topical Daily  . pantoprazole (PROTONIX) IV  40 mg Intravenous Q12H   PRN Meds potassium chloride **AND** sodium chloride, acetaminophen Results for orders placed or performed during the hospital encounter of 11/18/18 (from the past 48 hour(s))  Type and screen [facility]     Status: None (Preliminary result)   Collection Time: 11/18/18   5:26 PM  Result Value Ref Range   ABO/RH(D) A POS    Antibody Screen NEG    Sample Expiration      11/21/2018,2359 Performed at [facility] Lab, [address] [place], [place] [address]    Unit Number [id number]    Blood Component Type RED CELLS,LR    Unit division 00    Status of Unit ISSUED    Transfusion Status OK TO TRANSFUSE    Crossmatch Result Compatible    Unit Number [id number]    Blood Component Type RED CELLS,LR    Unit division 00    Status of Unit ISSUED    Transfusion Status OK TO TRANSFUSE    Crossmatch Result Compatible    Unit Number [id number]    Blood Component Type RBC LR PHER1    Unit division 00    Status of Unit ISSUED    Unit tag comment VERBAL ORDERS PER DR TEGELER    Transfusion Status OK TO TRANSFUSE    Crossmatch Result COMPATIBLE   CBC with Differential     Status: Abnormal   Collection Time: 11/18/18  5:31 PM  Result Value Ref Range   WBC 29.9 (H) 4.0 - 10.5 K/uL    Comment: REPEATED TO VERIFY WHITE COUNT CONFIRMED ON SMEAR    RBC 1.03 (L) 3.87 - 5.11 MIL/uL   Hemoglobin 3.4 (LL) 12.0 - 15.0 g/dL    Comment: REPEATED TO VERIFY RESULTS ACCEPTED BY K.COBBS RN THIS CRITICAL RESULT HAS VERIFIED AND BEEN CALLED TO K.COBBS RN BY KATHERINE MCCORMICK ON 09 15 2020 AT 1817, AND HAS BEEN READ BACK.     HCT 10.2 (L) 36.0 - 46.0 %   MCV 99.0 80.0 - 100.0 fL   MCH 33.0 26.0 - 34.0 pg   MCHC 33.3 30.0 - 36.0 g/dL   RDW 25.3 66.4 - 40.3 %   Platelets 239 150 - 400 K/uL    Comment: REPEATED TO VERIFY   nRBC 5.4 (H) 0.0 - 0.2 %   Neutrophils Relative % 77 %   Neutro Abs 23.0 (H) 1.7 - 7.7 K/uL   Lymphocytes Relative 14 %   Lymphs Abs 4.2 (H) 0.7 - 4.0 K/uL   Monocytes Relative 8 %   Monocytes Absolute 2.4 (H) 0.1 - 1.0 K/uL   Eosinophils Relative 1 %   Eosinophils Absolute 0.3 0.0 - 0.5 K/uL   Basophils Relative 0 %   Basophils Absolute 0.0 0.0 - 0.1 K/uL   Abs Immature Granulocytes 0.00 0.00 - 0.07 K/uL   Polychromasia PRESENT      Comment: Performed at [facility] Lab, [address] [place], [place] [address]  Comprehensive metabolic panel     Status: Abnormal   Collection Time: 11/18/18  5:31 PM  Result Value Ref Range   Sodium 134 (L) 135 - 145 mmol/L   Potassium 3.7 3.5 - 5.1 mmol/L   Chloride 105  98 - 111 mmol/L   CO2 20 (L) 22 - 32 mmol/L   Glucose, Bld 137 (H) 70 - 99 mg/dL   BUN 43 (H) 6 - 20 mg/dL   Creatinine, Ser 0.96 (H) 0.44 - 1.00 mg/dL   Calcium 7.6 (L) 8.9 - 10.3 mg/dL   Total Protein 4.5 (L) 6.5 - 8.1 g/dL   Albumin 2.7 (L) 3.5 - 5.0 g/dL   AST 17 15 - 41 U/L   ALT 12 0 - 44 U/L   Alkaline Phosphatase 37 (L) 38 - 126 U/L   Total Bilirubin 0.5 0.3 - 1.2 mg/dL   GFR calc non Af Amer 55 (L) >60 mL/min   GFR calc Af Amer >60 >60 mL/min   Anion gap 9 5 - 15    Comment: Performed at [facility], [address], [place], [place] [address]  Protime-INR     Status: Abnormal   Collection Time: 11/18/18  5:31 PM  Result Value Ref Range   Prothrombin Time >90.0 (H) 11.4 - 15.2 seconds   INR >10.0 (HH) 0.8 - 1.2    Comment: REPEATED TO VERIFY CRITICAL RESULT CALLED TO, READ BACK BY AND VERIFIED WITH: M.GAGE,RN [phone] [id number] I.MANNING (NOTE) INR goal varies based on device and disease states. Performed at [facility], [address], [place], [place] [address]   Troponin I (High Sensitivity)     Status: None   Collection Time: 11/18/18  5:31 PM  Result Value Ref Range   Troponin I (High Sensitivity) 16 <18 ng/L    Comment: (NOTE) Elevated high sensitivity troponin I (hsTnI) values and significant  changes across serial measurements may suggest ACS but many other  chronic and acute conditions are known to elevate hsTnI results.  Refer to the "Links" section for chest pain algorithms and additional  guidance. Performed at [facility], [address] [place], [place] [address]   Lipase, blood     Status: None   Collection Time: 11/18/18  5:31 PM  Result Value Ref  Range   Lipase 31 11 - 51 U/L    Comment: Performed at [facility], [address], [place], [place] [address]  Magnesium     Status: Abnormal   Collection Time: 11/18/18  5:31 PM  Result Value Ref Range   Magnesium 1.6 (L) 1.7 - 2.4 mg/dL    Comment: Performed at [facility], [address] [place], [place] [address]  Lactic acid, plasma     Status: Abnormal   Collection Time: 11/18/18  5:32 PM  Result Value Ref Range   Lactic Acid, Venous 4.4 (HH) 0.5 - 1.9 mmol/L    Comment: CRITICAL RESULT CALLED TO, READ BACK BY AND VERIFIED WITH: Dorian Pod [id number] 11/18/2018 WBOND Performed at [facility], [address] [place], [place] [address]   Brain natriuretic peptide     Status: Abnormal   Collection Time: 11/18/18  5:32 PM  Result Value Ref Range   B Natriuretic Peptide 249.3 (H) 0.0 - 100.0 pg/mL    Comment: Performed at [facility], [address] [place], [place] [address]  TSH     Status: Abnormal   Collection Time: 11/18/18  5:32 PM  Result Value Ref Range   TSH 6.143 (H) 0.350 - 4.500 uIU/mL    Comment: Performed by a 3rd Generation assay with a functional sensitivity of <=0.01 uIU/mL. Performed at [facility], [address] [place], [place] [address]   POC occult blood, ED     Status: Abnormal   Collection Time: 11/18/18  5:36 PM  Result Value Ref Range   Fecal Occult Bld POSITIVE (A) NEGATIVE  Prepare RBC     Status: None   Collection Time: 11/18/18  6:15 PM  Result Value Ref Range   Order Confirmation      ORDER PROCESSED BY BLOOD BANK Performed at [facility], [address] [place], [place] [address]   SARS Coronavirus 2 [facility] order, Performed in [facility] hospital lab) Nasopharyngeal Nasopharyngeal Swab     Status: None   Collection Time: 11/18/18  6:30 PM   Specimen: Nasopharyngeal Swab  Result Value Ref Range   SARS Coronavirus 2 NEGATIVE NEGATIVE    Comment: (NOTE) If result is NEGATIVE SARS-CoV-2 target  nucleic acids are NOT DETECTED. The SARS-CoV-2 RNA is generally detectable in upper and lower  respiratory specimens during the acute phase of infection. The lowest  concentration of SARS-CoV-2 viral copies this assay can detect is 250  copies / mL. A negative result does not preclude SARS-CoV-2 infection  and should not be used as the sole basis for treatment or other  patient management decisions.  A negative result may occur with  improper specimen collection / handling, submission of specimen other  than nasopharyngeal swab, presence of viral mutation(s) within the  areas targeted by this assay, and inadequate number of viral copies  (<250 copies / mL). A negative result must be combined with clinical  observations, patient history, and epidemiological information. If result is POSITIVE SARS-CoV-2 target nucleic acids are DETECTED. The SARS-CoV-2 RNA is generally detectable in upper and lower  respiratory specimens dur ing the acute phase of infection.  Positive  results are indicative of active infection with SARS-CoV-2.  Clinical  correlation with patient history and other diagnostic information is  necessary to determine patient infection status.  Positive results do  not rule out bacterial infection or co-infection with other viruses. If result is PRESUMPTIVE POSTIVE SARS-CoV-2 nucleic acids MAY BE PRESENT.   A presumptive positive result was obtained on the submitted specimen  and confirmed on repeat testing.  While 2019 novel coronavirus  (SARS-CoV-2) nucleic acids may be present in the submitted sample  additional confirmatory testing may be necessary for epidemiological  and / or clinical management purposes  to differentiate between  SARS-CoV-2 and other Sarbecovirus currently known to infect humans.  If clinically indicated additional testing with an alternate test  methodology [phone]) is advised. The SARS-CoV-2 RNA is generally  detectable in upper and lower  respiratory sp ecimens during the acute  phase of infection. The expected result is Negative. Fact Sheet for Patients:  BoilerBrush.com.cy Fact Sheet for Healthcare Providers: https://pope.com/ This test is not yet approved or cleared by the [place] FDA and has been authorized for detection and/or diagnosis of SARS-CoV-2 by FDA under an Emergency Use Authorization (EUA).  This EUA will remain in effect (meaning this test can be used) for the duration of the COVID-19 declaration under Section 564(b)(1) of the Act, 21 U.S.C. section 360bbb-3(b)(1), unless the authorization is terminated or revoked sooner. Performed at [facility], [address] [place], [place] [address]   Lactic acid, plasma     Status: Abnormal   Collection Time: 11/18/18  7:32 PM  Result Value Ref Range   Lactic Acid, Venous 3.1 (HH) 0.5 - 1.9 mmol/L    Comment: CRITICAL VALUE NOTED.  VALUE IS CONSISTENT WITH PREVIOUSLY REPORTED AND CALLED VALUE. Performed at [facility], [address] [place], [place] [address]   Troponin I (High Sensitivity)     Status: Abnormal   Collection Time: 11/18/18  7:32 PM  Result Value Ref Range   Troponin I (High Sensitivity) 19 (H) <18 ng/L    Comment: (NOTE) Elevated high sensitivity troponin I (hsTnI) values and significant  changes across serial measurements may suggest ACS but many other  chronic and acute conditions are known to elevate hsTnI results.  Refer to the "Links" section for chest pain algorithms and additional  guidance. Performed at [facility], [address] [place], [place] [address]   Prepare fresh frozen plasma     Status: None (Preliminary result)   Collection Time: 11/18/18  7:50 PM  Result Value Ref Range   Unit Number [id number]    Blood Component Type THAWED PLASMA    Unit division 00    Status of Unit ISSUED    Transfusion Status OK TO TRANSFUSE    Unit Number  [id number]    Blood Component Type THAWED PLASMA    Unit division 00    Status of Unit ALLOCATED    Transfusion Status OK TO TRANSFUSE   CBC     Status: Abnormal   Collection Time: 11/18/18 10:45 PM  Result Value Ref Range   WBC 19.4 (H) 4.0 - 10.5 K/uL   RBC 1.68 (L) 3.87 - 5.11 MIL/uL   Hemoglobin 5.5 (LL) 12.0 - 15.0 g/dL    Comment: REPEATED TO VERIFY POST TRANSFUSION SPECIMEN CRITICAL VALUE NOTED.  VALUE IS CONSISTENT WITH PREVIOUSLY REPORTED AND CALLED VALUE.    HCT 16.0 (L) 36.0 - 46.0 %   MCV 95.2 80.0 - 100.0 fL   MCH 32.7 26.0 - 34.0 pg   MCHC 34.4 30.0 - 36.0 g/dL   RDW 62.913.2 52.811.5 - 41.315.5 %   Platelets 164 150 - 400 K/uL    Comment: REPEATED TO VERIFY   nRBC 3.8 (H) 0.0 - 0.2 %    Comment: Performed at [facility], [address] [place], [place] [address]  Lactic acid, plasma     Status: None   Collection Time: 11/18/18 10:45 PM  Result Value Ref Range   Lactic Acid, Venous 1.8 0.5 - 1.9 mmol/L    Comment: Performed at [facility], [address] [place], [place] [address]  MRSA PCR Screening     Status: None   Collection Time: 11/18/18 11:07 PM   Specimen: Nasopharyngeal  Result Value Ref Range   MRSA by PCR NEGATIVE NEGATIVE    Comment:        The GeneXpert MRSA Assay (FDA approved for NASAL specimens only), is one component of a comprehensive MRSA colonization surveillance program. It is not intended to diagnose MRSA infection nor to guide or monitor treatment for MRSA infections. Performed at [facility], [address] [place], [place] [address]   Glucose, capillary     Status: Abnormal   Collection Time: 11/18/18 11:46 PM  Result Value Ref Range   Glucose-Capillary 115 (H) 70 - 99 mg/dL  Lactic acid, plasma     Status: None   Collection Time: 11/19/18  2:09 AM  Result Value Ref Range   Lactic Acid, Venous 1.8 0.5 - 1.9 mmol/L    Comment: Performed at [facility]  Lab, [address] [place], [place] [address]   Hemoglobin and hematocrit,  blood     Status: Abnormal   Collection Time: 11/19/18  2:09 AM  Result Value Ref Range   Hemoglobin 7.9 (L) 12.0 - 15.0 g/dL    Comment: REPEATED TO VERIFY POST TRANSFUSION SPECIMEN    HCT 21.9 (L) 36.0 - 46.0 %    Comment: Performed at [place] [address], [place], [place] [address]  CBC     Status: Abnormal   Collection Time: 11/19/18  4:11 AM  Result Value Ref Range   WBC 18.3 (H) 4.0 - 10.5 K/uL   RBC 2.29 (L) 3.87 - 5.11 MIL/uL   Hemoglobin 7.4 (L) 12.0 - 15.0 g/dL   HCT 20.9 (L) 36.0 - 46.0 %   MCV 91.3 80.0 - 100.0 fL   MCH 32.3 26.0 - 34.0 pg   MCHC 35.4 30.0 - 36.0 g/dL   RDW 13.9 11.5 - 15.5 %   Platelets 171 150 - 400 K/uL   nRBC 4.6 (H) 0.0 - 0.2 %    Comment: Performed at [facility], [place] [address] [place], [place] [address]  Basic metabolic panel     Status: Abnormal   Collection Time: 11/19/18  4:11 AM  Result Value Ref Range   Sodium 137 135 - 145 mmol/L   Potassium 3.4 (L) 3.5 - 5.1 mmol/L   Chloride 108 98 - 111 mmol/L   CO2 21 (L) 22 - 32 mmol/L   Glucose, Bld 122 (H) 70 - 99 mg/dL   BUN 24 (H) 6 - 20 mg/dL   Creatinine, Ser 0.68 0.44 - 1.00 mg/dL   Calcium 7.8 (L) 8.9 - 10.3 mg/dL   GFR calc non Af Amer >60 >60 mL/min   GFR calc Af Amer >60 >60 mL/min   Anion gap 8 5 - 15    Comment: Performed at [place] [address] [place], [place] [address]  Magnesium     Status: None   Collection Time: 11/19/18  4:11 AM  Result Value Ref Range   Magnesium 2.4 1.7 - 2.4 mg/dL    Comment: Performed at [place] [address], [place], [place] [address]  Phosphorus     Status: Abnormal   Collection Time: 11/19/18  4:11 AM  Result Value Ref Range   Phosphorus 2.2 (L) 2.5 - 4.6 mg/dL    Comment: Performed at [place] [address], [place], [place] [address]  Protime-INR     Status: Abnormal   Collection Time: 11/19/18  4:11 AM  Result Value Ref Range   Prothrombin Time  17.2 (H) 11.4 - 15.2 seconds   INR 1.4 (H) 0.8 - 1.2    Comment: (NOTE) INR goal varies based on device and disease states. Performed at [facility], [place] [address], [place], [place] [address]   Glucose, capillary     Status: Abnormal   Collection Time: 11/19/18  4:15 AM  Result Value Ref Range   Glucose-Capillary 122 (H) 70 - 99 mg/dL  Glucose, capillary     Status: Abnormal   Collection Time: 11/19/18  7:20 AM  Result Value Ref Range   Glucose-Capillary 112 (H) 70 - 99 mg/dL    Dg Chest Portable 1 View  Result Date: 11/18/2018 CLINICAL DATA:  Four-day history fatigue, dizziness and syncope. Hypotension and tachycardia upon emergency department arrival. EXAM: PORTABLE CHEST 1 VIEW COMPARISON:  05/08/2017 and earlier. FINDINGS: Prior sternotomy for aortic and mitral valve replacement. Prior LEFT atrial  appendage clipping. Cardiac silhouette upper normal in size, unchanged. Lungs clear. Bronchovascular markings normal. Pulmonary vascularity normal. No visible pleural effusions. No pneumothorax. IMPRESSION: No acute cardiopulmonary disease. Electronically Signed   By: Hulan Saashomas  Lawrence M.D.   On: 11/18/2018 18:05   ROS: Constitutional: Patient feels good most of the time denies shortness of breath with exertion HEENT: Negative Cardiovascular: No chest pain Respiratory: No shortness of breath. GI: Stools are normally brown no chronic diarrhea GU: Negative Musculoskeletal: Negative Neuro/Psychiatric: Negative Endocrine/Heme: Negative            Blood pressure (!) 89/75, pulse 87, temperature 98.9 F (37.2 C), temperature source Oral, resp. rate 20, height 5' 6.5" (1.689 m), weight 81.5 kg, SpO2 100 %.  Physical exam:   General--Pleasant white female lying in hospital bed ENT--nonicteric  neck-supple full range of motion Heart--mechanical valve clicks apparent Lungs--clear Abdomen--soft and nontender with good bowel sounds Psych--normal answers questions  appropriately   Assessment: 1.  GI bleed.  Probably upper GI.  The patient is on Protonix and this should be continued for now.  We need to go ahead with EGD as a first step.  Discussed with the endoscopy unit here and they are completely full and are unable to do her procedure until in the morning. 2.  Status post AVR and MVR on chronic anticoagulation with Coumadin 3. AFib s/p MAZE procedure  Plan: EGD tomorrow at 9 15-9 30 by Dr. Marca AnconaKarki.  We will keep her n.p.o. in case she bleeds heavily during the night and will turn off her heparin drip at 6 which should be 3-1/2 hours or so prior to the procedure.   Tresea MallJames L Twain Stenseth 11/19/2018, 10:30 AM   This note was created using voice recognition software and minor errors may Have occurred unintentionally. Pager: [phone] If no answer or after hours call [phone]

## 2018-11-19 NOTE — Progress Notes (Addendum)
ANTICOAGULATION CONSULT NOTE - Initial Consult  Pharmacy Consult for heparin Indication: atrial fibrillation and valve replacement  No Known Allergies  Patient Measurements: Height: 5' 6.5" (168.9 cm) Weight: 179 lb 10.8 oz (81.5 kg) IBW/kg (Calculated) : 60.45 Heparin Dosing Weight: 76  Vital Signs: Temp: 98.9 F (37.2 C) (09/16 0721) Temp Source: Oral (09/16 0721) BP: 89/75 (09/16 1000) Pulse Rate: 87 (09/16 1000)  Labs: Recent Labs    11/18/18 1731 11/18/18 1932 11/18/18 2245 11/19/18 0209 11/19/18 0411  HGB 3.4*  --  5.5* 7.9* 7.4*  HCT 10.2*  --  16.0* 21.9* 20.9*  PLT 239  --  164  --  171  LABPROT >90.0*  --   --   --  17.2*  INR >10.0*  --   --   --  1.4*  CREATININE 1.10*  --   --   --  0.68  TROPONINIHS 16 19*  --   --   --     Estimated Creatinine Clearance: 81.3 mL/min (by C-G formula based on SCr of 0.68 mg/dL).  Assessment: CC/HPI: dark colored diarrhea, dizziness x 4 days; hypotension/syncope while using the bathroom - initial Hgb 3.4, INR > 10. Reversed warfarin with vit K FFP  PMH: rheumatic heart disease, mitral and aortic valve replacement, chronic on warfarin; Afib  INR currently 1.4, warfarin on hold  GI seeing patient and planning scope at some point either today or tomorrow. Patient is high risk for clot s/p AC reversal and multiple valves  Goal of Therapy:  Heparin level 0.3-0.7 units/ml Monitor platelets by anticoagulation protocol: Yes   Plan:  Start heparin 950 units/hr (no bolus and low goal - will need to hold at some point for EGD but don't want to delay d/t multiple valves, afib) 1800 HL DC heparin at 0500 9/17 for EGD at 0915 Daily HL CBC  Levester Fresh, PharmD, BCPS, BCCCP Clinical Pharmacist [phone]  Please check AMION for all [place] numbers  11/19/2018 10:15 AM

## 2018-11-19 NOTE — Progress Notes (Signed)
[facility] Progress Note Patient Name: Sharon Figueroa DOB: Jun 16, 1960 MRN: [id number]   Date of Service  11/19/2018  HPI/Events of Note  K+ = 3.7, PO4--- = 1.6 and Creatinine = 0.61.  eICU Interventions  Will replace K+ and PO4---.     Intervention Category Major Interventions: Electrolyte abnormality - evaluation and management  Tamanna Whitson [place] 11/19/2018, 7:57 PM

## 2018-11-19 NOTE — Progress Notes (Signed)
  Echocardiogram 2D Echocardiogram has been performed.  Sharon Figueroa M 11/19/2018, 2:00 PM

## 2018-11-19 NOTE — Progress Notes (Signed)
ANTICOAGULATION CONSULT NOTE  Pharmacy Consult for heparin Indication: atrial fibrillation and valve replacement  No Known Allergies  Patient Measurements: Height: 5' 6.5" (168.9 cm) Weight: 179 lb 10.8 oz (81.5 kg) IBW/kg (Calculated) : 60.45 Heparin Dosing Weight: 76  Vital Signs: Temp: 98.9 F (37.2 C) (09/16 1516) Temp Source: Oral (09/16 1516) BP: 94/51 (09/16 1900) Pulse Rate: 96 (09/16 1900)  Labs: Recent Labs    11/18/18 1731 11/18/18 1932 11/18/18 2245  11/19/18 0411 11/19/18 1239 11/19/18 1836  HGB 3.4*  --  5.5*   < > 7.4* 7.6* 7.5*  HCT 10.2*  --  16.0*   < > 20.9* 21.1* 22.1*  PLT 239  --  164  --  171  --   --   LABPROT >90.0*  --   --   --  17.2*  --   --   INR >10.0*  --   --   --  1.4*  --   --   HEPARINUNFRC  --   --   --   --   --   --  0.39  CREATININE 1.10*  --   --   --  0.68  --   --   TROPONINIHS 16 19*  --   --   --   --   --    < > = values in this interval not displayed.    Estimated Creatinine Clearance: 81.3 mL/min (by C-G formula based on SCr of 0.68 mg/dL).  Assessment: CC/HPI: dark colored diarrhea, dizziness x 4 days; hypotension/syncope while using the bathroom - initial Hgb 3.4, INR > 10. Reversed warfarin with vit K FFP  PMH: rheumatic heart disease, mitral and aortic valve replacement, chronic on warfarin; Afib  INR currently 1.4, warfarin on hold  GI seeing patient and planning scope at some point either today or tomorrow. Patient is high risk for clot s/p AC reversal and multiple valves  PM heparin level therapeutic  Goal of Therapy:  Heparin level 0.3-0.7 units/ml Monitor platelets by anticoagulation protocol: Yes   Plan:  No change in heparin rate DC heparin at 0500 9/17 for EGD at 0915 Daily HL CBC  Thank you Anette Guarneri, PharmD  Please check AMION for all [facility] numbers  11/19/2018 7:26 PM

## 2018-11-19 NOTE — Anesthesia Preprocedure Evaluation (Addendum)
Anesthesia Evaluation  Patient identified by MRN, date of birth, ID band Patient awake    Reviewed: Allergy & Precautions, NPO status , Patient's Chart, lab work & pertinent test results  Airway Mallampati: II  TM Distance: >3 FB Neck ROM: Full    Dental no notable dental hx. (+) Teeth Intact, Dental Advisory Given   Pulmonary Current Smoker and Patient abstained from smoking., former smoker,    Pulmonary exam normal breath sounds clear to auscultation       Cardiovascular Pt. on home beta blockers + dysrhythmias Atrial Fibrillation Valvular problems/murmurs: MS.  Rhythm:Regular Rate:Normal  S/P MVR (mitral valve replacement) S/P AVR  ECHO 9/20  FINDINGS Left Ventricle: The left ventricle has mildly reduced systolic function, with an ejection fraction of 45-50%. The cavity size was normal. mild posterior wall hypertrophy. Left ventricular diastolic Doppler parameters are indeterminate.   Right Ventricle: The right ventricle has normal systolic function. The cavity was normal. There is no increase in right ventricular wall thickness.   Left Atrium: Left atrial size was severely dilated.   Neuro/Psych negative neurological ROS  negative psych ROS   GI/Hepatic negative GI ROS, Neg liver ROS,   Endo/Other  negative endocrine ROS  Renal/GU negative Renal ROS     Musculoskeletal negative musculoskeletal ROS (+)   Abdominal   Peds  Hematology  (+) Blood dyscrasia, anemia ,   Anesthesia Other Findings MS   Reproductive/Obstetrics                            Anesthesia Physical  Anesthesia Plan  ASA: III  Anesthesia Plan: MAC   Post-op Pain Management:    Induction: Intravenous  PONV Risk Score and Plan: 2 and Treatment may vary due to age or medical condition  Airway Management Planned: Nasal Cannula and Simple Face Mask  Additional Equipment:   Intra-op Plan:   Post-operative  Plan:   Informed Consent: I have reviewed the patients History and Physical, chart, labs and discussed the procedure including the risks, benefits and alternatives for the proposed anesthesia with the patient or authorized representative who has indicated his/her understanding and acceptance.     Dental advisory given  Plan Discussed with: CRNA and Anesthesiologist  Anesthesia Plan Comments:         Anesthesia Quick Evaluation

## 2018-11-19 NOTE — Consult Note (Signed)
[facility] CONSULT Reason for consult:GIL bleeding  Referring Physician: [facility] hospitalist.  PCP: None.  Primary GI: Unassigned  Sharon Figueroa is an 58 y.o. female.  HPI: She has a long history of cardiac valvular disease due to rheumatic heart disease.  2/19 she underwent AVR and MVR along with M AZE procedure for A. fib by Dr. Bartell.  She has been on Coumadin ever since and is followed by Dr. Crenshaw.  The patient is never had a colonoscopy, has never had any problems with reflux or GI bleeding.  She came to the emergency room after several days of loose dark stool and progressive weakness.  She never had abdominal pain.  Her INR was greater than 10 yesterday is down to 1.4 today after vitamin K and FFP.Hemoglobin was down to 3.4 and has increased to 7.9 with transfusion.  The patient is currently not on any anticoagulation.  Her stools were positive and BUN was slightly elevated at 24.  No family history of colon cancer, or any other GI cancer.  Past Medical History:  Diagnosis Date  . 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 AORTIC VALVE REPLACEMENT N/A 04/04/2017   Procedure: AORTIC VALVE REPLACEMENT (AVR);  Surgeon: Bartle, Bryan K, MD;  Location: [facility] OR;  Service: Open Heart Surgery;  Laterality: N/A;  . CARDIAC SURGERY    . MAZE N/A 04/04/2017   Procedure: MAZE;  Surgeon: Bartle, Bryan K, MD;  Location: [facility] OR;  Service: Open Heart Surgery;  Laterality: N/A;  . MITRAL VALVE REPLACEMENT N/A 04/04/2017   Procedure: MITRAL VALVE (MV) REPLACEMENT;  Surgeon: Bartle, Bryan K, MD;  Location: [facility] OR;  Service: Open Heart Surgery;  Laterality: N/A;  . No prior surgery    . RIGHT/LEFT HEART CATH AND CORONARY ANGIOGRAPHY N/A 04/01/2017   Procedure: RIGHT/LEFT HEART CATH AND CORONARY ANGIOGRAPHY;  Surgeon: Berry, Jonathan J, MD;  Location: [facility] INVASIVE [facility];  Service: Cardiovascular;  Laterality: N/A;  . TEE  WITHOUT CARDIOVERSION N/A 04/04/2017   Procedure: TRANSESOPHAGEAL ECHOCARDIOGRAM (TEE);  Surgeon: Bartle, Bryan K, MD;  Location: [facility] OR;  Service: Open Heart Surgery;  Laterality: N/A;    Family History  Problem Relation Age of Onset  . CVA Father   . Heart attack Father     Social History:  reports that she quit smoking about 19 months ago. Her smoking use included cigarettes. She smoked 1.00 pack per day. She has never used smokeless tobacco. She reports that she does not drink alcohol or use drugs.  Allergies: No Known Allergies  Medications; Prior to Admission medications   Medication Sig Start Date End Date Taking? Authorizing Provider  acetaminophen (TYLENOL) 325 MG tablet Take 2 tablets (650 mg total) by mouth every 6 (six) hours as needed for mild pain. 04/09/17  Yes Zimmerman, Donielle M, PA-C  aspirin EC 81 MG tablet Take 1 tablet (81 mg total) by mouth daily. 08/01/17  Yes Crenshaw, Brian S, MD  metoprolol succinate (TOPROL-XL) 25 MG 24 hr tablet Take 0.5 tablets (12.5 mg total) by mouth daily. MUST KEEP APPOINTMENT 01/05/19 WITH DR CRENSHAW FOR FUTURE REFILLS Patient taking differently: Take 12.5 mg by mouth daily.  10/03/18  Yes Crenshaw, Brian S, MD  warfarin (COUMADIN) 5 MG tablet Take 1 to 1 and 1/2 tablets by mouth as directed by coumadin clinic Patient taking differently: Take 5-7.5 mg by mouth daily after supper. Take 5 mg   by mouth after supper (evening meal) on Sun/Tues/Thurs/Sat and 7.5 mg on Mon/Wed/Fri 10/03/18  Yes Crenshaw, Brian S, MD   . sodium chloride   Intravenous Once  . Chlorhexidine Gluconate Cloth  6 each Topical Daily  . pantoprazole (PROTONIX) IV  40 mg Intravenous Q12H   PRN Meds potassium chloride **AND** sodium chloride, acetaminophen Results for orders placed or performed during the hospital encounter of 11/18/18 (from the past 48 hour(s))  Type and screen [facility]     Status: None (Preliminary result)   Collection Time: 11/18/18   5:26 PM  Result Value Ref Range   ABO/RH(D) A POS    Antibody Screen NEG    Sample Expiration      11/21/2018,2359 Performed at [facility], [address] [place], [place] [address]    Unit Number [id number]    Blood Component Type RED CELLS,LR    Unit division 00    Status of Unit ISSUED    Transfusion Status OK TO TRANSFUSE    Crossmatch Result Compatible    Unit Number [id number]    Blood Component Type RED CELLS,LR    Unit division 00    Status of Unit ISSUED    Transfusion Status OK TO TRANSFUSE    Crossmatch Result Compatible    Unit Number [id number]    Blood Component Type RBC LR PHER1    Unit division 00    Status of Unit ISSUED    Unit tag comment VERBAL ORDERS PER DR TEGELER    Transfusion Status OK TO TRANSFUSE    Crossmatch Result COMPATIBLE   CBC with Differential     Status: Abnormal   Collection Time: 11/18/18  5:31 PM  Result Value Ref Range   WBC 29.9 (H) 4.0 - 10.5 K/uL    Comment: REPEATED TO VERIFY WHITE COUNT CONFIRMED ON SMEAR    RBC 1.03 (L) 3.87 - 5.11 MIL/uL   Hemoglobin 3.4 (LL) 12.0 - 15.0 g/dL    Comment: REPEATED TO VERIFY RESULTS ACCEPTED BY K.COBBS RN THIS CRITICAL RESULT HAS VERIFIED AND BEEN CALLED TO K.COBBS RN BY KATHERINE MCCORMICK ON 09 15 2020 AT 1817, AND HAS BEEN READ BACK.     HCT 10.2 (L) 36.0 - 46.0 %   MCV 99.0 80.0 - 100.0 fL   MCH 33.0 26.0 - 34.0 pg   MCHC 33.3 30.0 - 36.0 g/dL   RDW 15.1 11.5 - 15.5 %   Platelets 239 150 - 400 K/uL    Comment: REPEATED TO VERIFY   nRBC 5.4 (H) 0.0 - 0.2 %   Neutrophils Relative % 77 %   Neutro Abs 23.0 (H) 1.7 - 7.7 K/uL   Lymphocytes Relative 14 %   Lymphs Abs 4.2 (H) 0.7 - 4.0 K/uL   Monocytes Relative 8 %   Monocytes Absolute 2.4 (H) 0.1 - 1.0 K/uL   Eosinophils Relative 1 %   Eosinophils Absolute 0.3 0.0 - 0.5 K/uL   Basophils Relative 0 %   Basophils Absolute 0.0 0.0 - 0.1 K/uL   Abs Immature Granulocytes 0.00 0.00 - 0.07 K/uL   Polychromasia PRESENT      Comment: Performed at  Hospital Lab, [address] [place], [place] [address]  Comprehensive metabolic panel     Status: Abnormal   Collection Time: 11/18/18  5:31 PM  Result Value Ref Range   Sodium 134 (L) 135 - 145 mmol/L   Potassium 3.7 3.5 - 5.1 mmol/L   Chloride 105   98 - 111 mmol/L   CO2 20 (L) 22 - 32 mmol/L   Glucose, Bld 137 (H) 70 - 99 mg/dL   BUN 43 (H) 6 - 20 mg/dL   Creatinine, Ser 1.10 (H) 0.44 - 1.00 mg/dL   Calcium 7.6 (L) 8.9 - 10.3 mg/dL   Total Protein 4.5 (L) 6.5 - 8.1 g/dL   Albumin 2.7 (L) 3.5 - 5.0 g/dL   AST 17 15 - 41 U/L   ALT 12 0 - 44 U/L   Alkaline Phosphatase 37 (L) 38 - 126 U/L   Total Bilirubin 0.5 0.3 - 1.2 mg/dL   GFR calc non Af Amer 55 (L) >60 mL/min   GFR calc Af Amer >60 >60 mL/min   Anion gap 9 5 - 15    Comment: Performed at [facility], [address] [place], [place] [address]  Protime-INR     Status: Abnormal   Collection Time: 11/18/18  5:31 PM  Result Value Ref Range   Prothrombin Time >90.0 (H) 11.4 - 15.2 seconds   INR >10.0 (HH) 0.8 - 1.2    Comment: REPEATED TO VERIFY CRITICAL RESULT CALLED TO, READ BACK BY AND VERIFIED WITH: M.GAGE,RN 1839 [id number] I.MANNING (NOTE) INR goal varies based on device and disease states. Performed at [facility], [address] [place], [place] [address]   Troponin I (High Sensitivity)     Status: None   Collection Time: 11/18/18  5:31 PM  Result Value Ref Range   Troponin I (High Sensitivity) 16 <18 ng/L    Comment: (NOTE) Elevated high sensitivity troponin I (hsTnI) values and significant  changes across serial measurements may suggest ACS but many other  chronic and acute conditions are known to elevate hsTnI results.  Refer to the "Links" section for chest pain algorithms and additional  guidance. Performed at [facility], [address] [place], [place] [address]   Lipase, blood     Status: None   Collection Time: 11/18/18  5:31 PM  Result Value Ref  Range   Lipase 31 11 - 51 U/L    Comment: Performed at [facility], [address] [place], [place] [address]  Magnesium     Status: Abnormal   Collection Time: 11/18/18  5:31 PM  Result Value Ref Range   Magnesium 1.6 (L) 1.7 - 2.4 mg/dL    Comment: Performed at [facility], [address] [place], [place] [address]  Lactic acid, plasma     Status: Abnormal   Collection Time: 11/18/18  5:32 PM  Result Value Ref Range   Lactic Acid, Venous 4.4 (HH) 0.5 - 1.9 mmol/L    Comment: CRITICAL RESULT CALLED TO, READ BACK BY AND VERIFIED WITH: M GABE,RN 1827 11/18/2018 WBOND Performed at [facility], [address] [place], [place] [address]   Brain natriuretic peptide     Status: Abnormal   Collection Time: 11/18/18  5:32 PM  Result Value Ref Range   B Natriuretic Peptide 249.3 (H) 0.0 - 100.0 pg/mL    Comment: Performed at [facility], [address] [place], [place] [address]  TSH     Status: Abnormal   Collection Time: 11/18/18  5:32 PM  Result Value Ref Range   TSH 6.143 (H) 0.350 - 4.500 uIU/mL    Comment: Performed by a 3rd Generation assay with a functional sensitivity of <=0.01 uIU/mL. Performed at [facility], [address] [place], [place] [address]   POC occult blood, ED     Status: Abnormal   Collection Time: 11/18/18  5:36 PM  Result Value Ref Range   Fecal Occult Bld POSITIVE (A) NEGATIVE  Prepare RBC     Status: None   Collection Time: 11/18/18  6:15 PM  Result Value Ref Range   Order Confirmation      ORDER PROCESSED BY BLOOD BANK Performed at [facility], [address] [place], [place] [address]   SARS Coronavirus 2 (Hospital order, Performed in [place] hospital lab) Nasopharyngeal Nasopharyngeal Swab     Status: None   Collection Time: 11/18/18  6:30 PM   Specimen: Nasopharyngeal Swab  Result Value Ref Range   SARS Coronavirus 2 NEGATIVE NEGATIVE    Comment: (NOTE) If result is NEGATIVE SARS-CoV-2 target  nucleic acids are NOT DETECTED. The SARS-CoV-2 RNA is generally detectable in upper and lower  respiratory specimens during the acute phase of infection. The lowest  concentration of SARS-CoV-2 viral copies this assay can detect is 250  copies / mL. A negative result does not preclude SARS-CoV-2 infection  and should not be used as the sole basis for treatment or other  patient management decisions.  A negative result may occur with  improper specimen collection / handling, submission of specimen other  than nasopharyngeal swab, presence of viral mutation(s) within the  areas targeted by this assay, and inadequate number of viral copies  (<250 copies / mL). A negative result must be combined with clinical  observations, patient history, and epidemiological information. If result is POSITIVE SARS-CoV-2 target nucleic acids are DETECTED. The SARS-CoV-2 RNA is generally detectable in upper and lower  respiratory specimens dur ing the acute phase of infection.  Positive  results are indicative of active infection with SARS-CoV-2.  Clinical  correlation with patient history and other diagnostic information is  necessary to determine patient infection status.  Positive results do  not rule out bacterial infection or co-infection with other viruses. If result is PRESUMPTIVE POSTIVE SARS-CoV-2 nucleic acids MAY BE PRESENT.   A presumptive positive result was obtained on the submitted specimen  and confirmed on repeat testing.  While 2019 novel coronavirus  (SARS-CoV-2) nucleic acids may be present in the submitted sample  additional confirmatory testing may be necessary for epidemiological  and / or clinical management purposes  to differentiate between  SARS-CoV-2 and other Sarbecovirus currently known to infect humans.  If clinically indicated additional testing with an alternate test  methodology ([id number]) is advised. The SARS-CoV-2 RNA is generally  detectable in upper and lower  respiratory sp ecimens during the acute  phase of infection. The expected result is Negative. Fact Sheet for Patients:  https://www.fda.gov/media/136312/download Fact Sheet for Healthcare Providers: https://www.fda.gov/media/136313/download This test is not yet approved or cleared by the [place] FDA and has been authorized for detection and/or diagnosis of SARS-CoV-2 by FDA under an Emergency Use Authorization (EUA).  This EUA will remain in effect (meaning this test can be used) for the duration of the COVID-19 declaration under Section 564(b)(1) of the Act, 21 U.S.C. section 360bbb-3(b)(1), unless the authorization is terminated or revoked sooner. Performed at [facility], [address] [place], [place] [address]   Lactic acid, plasma     Status: Abnormal   Collection Time: 11/18/18  7:32 PM  Result Value Ref Range   Lactic Acid, Venous 3.1 (HH) 0.5 - 1.9 mmol/L    Comment: CRITICAL VALUE NOTED.    VALUE IS CONSISTENT WITH PREVIOUSLY REPORTED AND CALLED VALUE. Performed at [facility], [address] [place], [place] [address]   Troponin I (High Sensitivity)     Status: Abnormal   Collection Time: 11/18/18  7:32 PM  Result Value Ref Range   Troponin I (High Sensitivity) 19 (H) <18 ng/L    Comment: (NOTE) Elevated high sensitivity troponin I (hsTnI) values and significant  changes across serial measurements may suggest ACS but many other  chronic and acute conditions are known to elevate hsTnI results.  Refer to the "Links" section for chest pain algorithms and additional  guidance. Performed at [facility], [address] [place], [place] [address]   Prepare fresh frozen plasma     Status: None (Preliminary result)   Collection Time: 11/18/18  7:50 PM  Result Value Ref Range   Unit Number [id number]    Blood Component Type THAWED PLASMA    Unit division 00    Status of Unit ISSUED    Transfusion Status OK TO TRANSFUSE    Unit Number  [id number]    Blood Component Type THAWED PLASMA    Unit division 00    Status of Unit ALLOCATED    Transfusion Status OK TO TRANSFUSE   CBC     Status: Abnormal   Collection Time: 11/18/18 10:45 PM  Result Value Ref Range   WBC 19.4 (H) 4.0 - 10.5 K/uL   RBC 1.68 (L) 3.87 - 5.11 MIL/uL   Hemoglobin 5.5 (LL) 12.0 - 15.0 g/dL    Comment: REPEATED TO VERIFY POST TRANSFUSION SPECIMEN CRITICAL VALUE NOTED.  VALUE IS CONSISTENT WITH PREVIOUSLY REPORTED AND CALLED VALUE.    HCT 16.0 (L) 36.0 - 46.0 %   MCV 95.2 80.0 - 100.0 fL   MCH 32.7 26.0 - 34.0 pg   MCHC 34.4 30.0 - 36.0 g/dL   RDW 13.2 11.5 - 15.5 %   Platelets 164 150 - 400 K/uL    Comment: REPEATED TO VERIFY   nRBC 3.8 (H) 0.0 - 0.2 %    Comment: Performed at [facility], [address] [place], [place] [address]  Lactic acid, plasma     Status: None   Collection Time: 11/18/18 10:45 PM  Result Value Ref Range   Lactic Acid, Venous 1.8 0.5 - 1.9 mmol/L    Comment: Performed at [facility], [address] [place], [place] [address]  MRSA PCR Screening     Status: None   Collection Time: 11/18/18 11:07 PM   Specimen: Nasopharyngeal  Result Value Ref Range   MRSA by PCR NEGATIVE NEGATIVE    Comment:        The GeneXpert MRSA Assay (FDA approved for NASAL specimens only), is one component of a comprehensive MRSA colonization surveillance program. It is not intended to diagnose MRSA infection nor to guide or monitor treatment for MRSA infections. Performed at [facility], [address] [place], [place] [address]   Glucose, capillary     Status: Abnormal   Collection Time: 11/18/18 11:46 PM  Result Value Ref Range   Glucose-Capillary 115 (H) 70 - 99 mg/dL  Lactic acid, plasma     Status: None   Collection Time: 11/19/18  2:09 AM  Result Value Ref Range   Lactic Acid, Venous 1.8 0.5 - 1.9 mmol/L    Comment: Performed at [facility], [address] , [place] [address]   Hemoglobin and hematocrit,   blood     Status: Abnormal   Collection Time: 11/19/18  2:09 AM  Result Value Ref Range   Hemoglobin 7.9 (L) 12.0 - 15.0 g/dL    Comment: REPEATED TO VERIFY POST TRANSFUSION SPECIMEN    HCT 21.9 (L) 36.0 - 46.0 %    Comment: Performed at [facility], [address] [place], [place] [address]  CBC     Status: Abnormal   Collection Time: 11/19/18  4:11 AM  Result Value Ref Range   WBC 18.3 (H) 4.0 - 10.5 K/uL   RBC 2.29 (L) 3.87 - 5.11 MIL/uL   Hemoglobin 7.4 (L) 12.0 - 15.0 g/dL   HCT 20.9 (L) 36.0 - 46.0 %   MCV 91.3 80.0 - 100.0 fL   MCH 32.3 26.0 - 34.0 pg   MCHC 35.4 30.0 - 36.0 g/dL   RDW 13.9 11.5 - 15.5 %   Platelets 171 150 - 400 K/uL   nRBC 4.6 (H) 0.0 - 0.2 %    Comment: Performed at [facility], [address] [place], [place] [address]  Basic metabolic panel     Status: Abnormal   Collection Time: 11/19/18  4:11 AM  Result Value Ref Range   Sodium 137 135 - 145 mmol/L   Potassium 3.4 (L) 3.5 - 5.1 mmol/L   Chloride 108 98 - 111 mmol/L   CO2 21 (L) 22 - 32 mmol/L   Glucose, Bld 122 (H) 70 - 99 mg/dL   BUN 24 (H) 6 - 20 mg/dL   Creatinine, Ser 0.68 0.44 - 1.00 mg/dL   Calcium 7.8 (L) 8.9 - 10.3 mg/dL   GFR calc non Af Amer >60 >60 mL/min   GFR calc Af Amer >60 >60 mL/min   Anion gap 8 5 - 15    Comment: Performed at [facility], [address] [place], [place] [address]  Magnesium     Status: None   Collection Time: 11/19/18  4:11 AM  Result Value Ref Range   Magnesium 2.4 1.7 - 2.4 mg/dL    Comment: Performed at [facility], [address] [place], [place] [address]  Phosphorus     Status: Abnormal   Collection Time: 11/19/18  4:11 AM  Result Value Ref Range   Phosphorus 2.2 (L) 2.5 - 4.6 mg/dL    Comment: Performed at [facility], [address] [place],  [address]  Protime-INR     Status: Abnormal   Collection Time: 11/19/18  4:11 AM  Result Value Ref Range   Prothrombin Time  17.2 (H) 11.4 - 15.2 seconds   INR 1.4 (H) 0.8 - 1.2    Comment: (NOTE) INR goal varies based on device and disease states. Performed at  Hospital Lab, [address] [place],  [address]   Glucose, capillary     Status: Abnormal   Collection Time: 11/19/18  4:15 AM  Result Value Ref Range   Glucose-Capillary 122 (H) 70 - 99 mg/dL  Glucose, capillary     Status: Abnormal   Collection Time: 11/19/18  7:20 AM  Result Value Ref Range   Glucose-Capillary 112 (H) 70 - 99 mg/dL    Dg Chest Portable 1 View  Result Date: 11/18/2018 CLINICAL DATA:  Four-day history fatigue, dizziness and syncope. Hypotension and tachycardia upon emergency department arrival. EXAM: PORTABLE CHEST 1 VIEW COMPARISON:  05/08/2017 and earlier. FINDINGS: Prior sternotomy for aortic and mitral valve replacement. Prior LEFT atrial   appendage clipping. Cardiac silhouette upper normal in size, unchanged. Lungs clear. Bronchovascular markings normal. Pulmonary vascularity normal. No visible pleural effusions. No pneumothorax. IMPRESSION: No acute cardiopulmonary disease. Electronically Signed   By: Thomas  Lawrence M.D.   On: 11/18/2018 18:05   ROS: Constitutional: Patient feels good most of the time denies shortness of breath with exertion HEENT: Negative Cardiovascular: No chest pain Respiratory: No shortness of breath. GI: Stools are normally brown no chronic diarrhea GU: Negative Musculoskeletal: Negative Neuro/Psychiatric: Negative Endocrine/Heme: Negative            Blood pressure (!) 89/75, pulse 87, temperature 98.9 F (37.2 C), temperature source Oral, resp. rate 20, height 5' 6.5" (1.689 m), weight 81.5 kg, SpO2 100 %.  Physical exam:   General--Pleasant white female lying in hospital bed ENT--nonicteric  neck-supple full range of motion Heart--mechanical valve clicks apparent Lungs--clear Abdomen--soft and nontender with good bowel sounds Psych--normal answers questions  appropriately   Assessment: 1.  GI bleed.  Probably upper GI.  The patient is on Protonix and this should be continued for now.  We need to go ahead with EGD as a first step.  Discussed with the endoscopy unit here and they are completely full and are unable to do her procedure until in the morning. 2.  Status post AVR and MVR on chronic anticoagulation with Coumadin 3. AFib s/p MAZE procedure  Plan: EGD tomorrow at 9 15-9 30 by Dr. Karki.  We will keep her n.p.o. in case she bleeds heavily during the night and will turn off her heparin drip at 6 which should be 3-1/2 hours or so prior to the procedure.   Nakeesha Bowler L Clell Trahan 11/19/2018, 10:30 AM   This note was created using voice recognition software and minor errors may Have occurred unintentionally. Pager: [phone] If no answer or after hours call [phone]    

## 2018-11-19 NOTE — Progress Notes (Signed)
Pt experiencing short runs of ventricular bigeminy.  Pt is asymptomatic.  Informed Georgann Housekeeper NP.  BMP, Mag, and Phos lab ordered. Will continue to monitor closely.  Irven Baltimore, RN

## 2018-11-19 NOTE — Progress Notes (Addendum)
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    History of present illness   Patient is normally on warfarin with goal of 3-3.5 for her mechanical valve replacements (history of rheumatic fever as a child).  She started having profuse diarrhea with black stools 3 days ago on Saturday.  No vomiting, and no pain, but did have decreased appetite and did not eat much.  She kept taking her medications- warfarin, ASA 81, and metoprolol.  She denies any NSAIDs, denies history of ulcers or GI bleed in the past.  No infectious symptoms- no fevers, chills, cough, shortness of breath.  She did pass out 3 times in the last couple days, but denies ever hitting her head.  She is very lightheaded with sitting up and standing up, but no lightheadedness with lying flat.    In the ED, prior to me seeing her, she was hypotensive in the 70s/50s, mentating fine laying down.  She had received 1 unit PRBCs in addition to 2L IV fluids.  BP was starting to improve- up to 90s/50s after receiving the unit.  GI was called by the ED, and are planning to see patient in the AM.  Laboratory eval significant for INR > 10.  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 No further stools/bleeding noted.   Objective   Blood pressure 109/65, pulse 90, temperature 98.9 F (37.2 C), temperature source Oral, resp. rate 14, height 5' 6.5" (1.689 m),  weight 81.5 kg, SpO2 100 %.        Intake/Output Summary (Last 24 hours) at 11/19/2018 0828 Last data filed at 11/19/2018 0200 Gross per 24 hour  Intake 1022.24 ml  Output -  Net 1022.24 ml   Filed Weights   11/18/18 1726 11/18/18 2300 11/19/18 0500  Weight: 77.1 kg 82.3 kg 81.5 kg    Examination:  General: middle aged female in NAD HENT: [place]/AT, PERRL, no JVD. Glasses.  Lungs: Clear bilateral breath sounds Cardiovascular: mechanical click. Sinus on tele. Rate 90. Abdomen: soft, non-distended. Tender over RLQ, she equates this with needing to have bowel movement.  Extremities: no edema, no obvious bruising Neuro: alert, oriented, non-focal  Resolved Hospital Problem list   Hemorrhagic shock  Assessment & Plan:  58 yo F with acute blood loss anemia due to GI bleed, and supratherapeutic INR.    Acute blood loss anemia: s/p 3 units PRBC. Of note Hgb in 04/2017 was 8.5. GI bleed, likely upper GI bleed Supratherapeutic INR: > 10 on admit. s/p Vit K and 2 units FFP 9/15 - ppi BID - GI follownig - NPO for possible EGD - continue trend H&H q6H - trend INR (1.4 today) - Will start heparin for AC given mechanical mitral/aortic valves. No bolus. Dosing per pharmacy. Target low end of therapeutic range.  - hold warfarin, ASA.   Hypotension: maps in low 60s intermittently. Patient reports normal SBP in 90s.  -  Continue maintenance  IVF - Blood products depending of H&H results.   Aortic and mitral mechanical valves (hx rheumatic disease) Chronic warfarin use with goal INR 3-3.5 - holding warfarin, asa, metoprolol.   Leukocytosis: likely reactive - trend CBC  Best practice:  Diet: NPO Pain/Anxiety/Delirium protocol (if indicated): N/A VAP protocol (if indicated): n/a DVT prophylaxis: SCDs (active bleeding) GI prophylaxis: ppi Glucose control: q4h glucose checks Mobility: n/a Code Status: Full Family Communication: patient updated Disposition: Will keep in ICU pending  heparin infusion in setting of GI bleed.   Labs   CBC: Recent Labs  Lab 11/18/18 1731 11/18/18 2245 11/19/18 0209 11/19/18 0411  WBC 29.9* 19.4*  --  18.3*  NEUTROABS 23.0*  --   --   --   HGB 3.4* 5.5* 7.9* 7.4*  HCT 10.2* 16.0* 21.9* 20.9*  MCV 99.0 95.2  --  91.3  PLT 239 164  --  831    Basic Metabolic Panel: Recent Labs  Lab 11/18/18 1731 11/19/18 0411  NA 134* 137  K 3.7 3.4*  CL 105 108  CO2 20* 21*  GLUCOSE 137* 122*  BUN 43* 24*  CREATININE 1.10* 0.68  CALCIUM 7.6* 7.8*  MG 1.6* 2.4  PHOS  --  2.2*   GFR: Estimated Creatinine Clearance: 81.3 mL/min (by C-G formula based on SCr of 0.68 mg/dL). Recent Labs  Lab 11/18/18 1731 11/18/18 1732 11/18/18 1932 11/18/18 2245 11/19/18 0209 11/19/18 0411  WBC 29.9*  --   --  19.4*  --  18.3*  LATICACIDVEN  --  4.4* 3.1* 1.8 1.8  --     Liver Function Tests: Recent Labs  Lab 11/18/18 1731  AST 17  ALT 12  ALKPHOS 37*  BILITOT 0.5  PROT 4.5*  ALBUMIN 2.7*   Recent Labs  Lab 11/18/18 1731  LIPASE 31   No results for input(s): AMMONIA in the last 168 hours.  ABG    Component Value Date/Time   PHART 7.375 04/04/2017 2009   PCO2ART 31.8 (L) 04/04/2017 2009   PO2ART 55.0 (L) 04/04/2017 2009   HCO3 18.7 (L) 04/04/2017 2009   TCO2 27 04/06/2017 1551   ACIDBASEDEF 6.0 (H) 04/04/2017 2009   O2SAT 89.0 04/04/2017 2009     Coagulation Profile: Recent Labs  Lab 11/18/18 1731 11/19/18 0411  INR >10.0* 1.4*    Cardiac Enzymes: No results for input(s): CKTOTAL, CKMB, CKMBINDEX, TROPONINI in the last 168 hours.  HbA1C: Hgb A1c MFr Bld  Date/Time Value Ref Range Status  03/30/2017 05:21 PM 5.3 4.8 - 5.6 % Final    Comment:    (NOTE) Pre diabetes:          5.7%-6.4% Diabetes:              >6.4% Glycemic control for   <7.0% adults with diabetes     CBG: Recent Labs  Lab 11/18/18 2346 11/19/18 0415 11/19/18 0720  GLUCAP 115* 122* 112*    Review of Systems:   ROS negative except for  above in HPI  Past Medical History  She,  has a past medical history of Mitral valve disorder.   Surgical History    Past Surgical History:  Procedure Laterality Date  . AORTIC VALVE REPLACEMENT N/A 04/04/2017   Procedure: AORTIC VALVE REPLACEMENT (AVR);  Surgeon: Gaye Pollack, MD;  Location: [facility];  Service: Open Heart Surgery;  Laterality: N/A;  . CARDIAC SURGERY    . MAZE N/A 04/04/2017   Procedure: MAZE;  Surgeon: Gilford Raid  K, MD;  Location: [facility] OR;  Service: Open Heart Surgery;  Laterality: N/A;  . MITRAL VALVE REPLACEMENT N/A 04/04/2017   Procedure: MITRAL VALVE (MV) REPLACEMENT;  Surgeon: Alleen Borne, MD;  Location: [facility] OR;  Service: Open Heart Surgery;  Laterality: N/A;  . No prior surgery    . RIGHT/LEFT HEART CATH AND CORONARY ANGIOGRAPHY N/A 04/01/2017   Procedure: RIGHT/LEFT HEART CATH AND CORONARY ANGIOGRAPHY;  Surgeon: Runell Gess, MD;  Location: [facility] INVASIVE CV LAB;  Service: Cardiovascular;  Laterality: N/A;  . TEE WITHOUT CARDIOVERSION N/A 04/04/2017   Procedure: TRANSESOPHAGEAL ECHOCARDIOGRAM (TEE);  Surgeon: Alleen Borne, MD;  Location: [facility];  Service: Open Heart Surgery;  Laterality: N/A;     Social History   reports that she quit smoking about 19 months ago. Her smoking use included cigarettes. She smoked 1.00 pack per day. She has never used smokeless tobacco. She reports that she does not drink alcohol or use drugs.   Family History   Her family history includes CVA in her father; Heart attack in her father.   Allergies No Known Allergies   Home Medications  Prior to Admission medications   Medication Sig Start Date End Date Taking? Authorizing Provider  acetaminophen (TYLENOL) 325 MG tablet Take 2 tablets (650 mg total) by mouth every 6 (six) hours as needed for mild pain. 04/09/17   Ardelle Balls, PA-C  aspirin EC 81 MG tablet Take 1 tablet (81 mg total) by mouth daily. 08/01/17   Lewayne Bunting, MD  metoprolol succinate (TOPROL-XL)  25 MG 24 hr tablet Take 0.5 tablets (12.5 mg total) by mouth daily. MUST KEEP APPOINTMENT 01/05/19 WITH DR Jens Som FOR FUTURE REFILLS 10/03/18   Lewayne Bunting, MD  warfarin (COUMADIN) 5 MG tablet Take 1 to 1 and 1/2 tablets by mouth as directed by coumadin clinic 10/03/18   Lewayne Bunting, MD     Critical care time: 45 mins  CRITICAL CARE Performed by: Duayne Cal  Critical care time was exclusive of separately billable procedures and treating other patients.  Critical care was necessary to treat or prevent imminent or life-threatening deterioration in the setting of: - Acute blood loss anemia - GI bleed - Need for anticoagulation - Mechanical heart valves - Hypotesion  Critical care was time spent personally by me on the following activities: development of treatment plan with patient and/or surrogate as well as nursing, discussions with consultants, evaluation of patient's response to treatment, examination of patient, obtaining history from patient or surrogate, ordering and performing treatments and interventions, ordering and review of laboratory studies, ordering and review of radiographic studies, pulse oximetry and re-evaluation of patient's condition.      Joneen Roach, AGACNP-BC [facility] Pulmonary/Critical Care Pager [phone] or [phone]  11/19/2018 8:51 AM

## 2018-11-20 ENCOUNTER — Encounter (HOSPITAL_COMMUNITY)
Admission: EM | Disposition: A | Discharge status: Home / Self Care | Payer: Self-pay | Source: Home / Self Care | Attending: Internal Medicine

## 2018-11-20 ENCOUNTER — Inpatient Hospital Stay (HOSPITAL_COMMUNITY): Payer: BC Managed Care – PPO | Admitting: Anesthesiology

## 2018-11-20 ENCOUNTER — Encounter (HOSPITAL_COMMUNITY): Payer: Self-pay | Admitting: *Deleted

## 2018-11-20 DIAGNOSIS — K449 Diaphragmatic hernia without obstruction or gangrene: Secondary | ICD-10-CM | POA: Diagnosis not present

## 2018-11-20 DIAGNOSIS — K222 Esophageal obstruction: Secondary | ICD-10-CM | POA: Diagnosis not present

## 2018-11-20 DIAGNOSIS — K259 Gastric ulcer, unspecified as acute or chronic, without hemorrhage or perforation: Secondary | ICD-10-CM | POA: Diagnosis not present

## 2018-11-20 DIAGNOSIS — K319 Disease of stomach and duodenum, unspecified: Secondary | ICD-10-CM | POA: Diagnosis not present

## 2018-11-20 DIAGNOSIS — R578 Other shock: Secondary | ICD-10-CM | POA: Diagnosis not present

## 2018-11-20 DIAGNOSIS — K254 Chronic or unspecified gastric ulcer with hemorrhage: Secondary | ICD-10-CM | POA: Diagnosis not present

## 2018-11-20 HISTORY — PX: HOT HEMOSTASIS: SHX5433

## 2018-11-20 HISTORY — PX: ESOPHAGOGASTRODUODENOSCOPY: SHX5428

## 2018-11-20 LAB — PROTIME-INR
INR: 1.1 (ref 0.8–1.2)
Prothrombin Time: 14.4 seconds (ref 11.4–15.2)

## 2018-11-20 LAB — HEPARIN LEVEL (UNFRACTIONATED)
Heparin Unfractionated: 0.11 IU/mL — ABNORMAL LOW (ref 0.30–0.70)
Heparin Unfractionated: 0.19 IU/mL — ABNORMAL LOW (ref 0.30–0.70)

## 2018-11-20 LAB — BASIC METABOLIC PANEL
Anion gap: 8 (ref 5–15)
BUN: 10 mg/dL (ref 6–20)
CO2: 25 mmol/L (ref 22–32)
Calcium: 7.5 mg/dL — ABNORMAL LOW (ref 8.9–10.3)
Chloride: 104 mmol/L (ref 98–111)
Creatinine, Ser: 0.61 mg/dL (ref 0.44–1.00)
GFR calc Af Amer: >60 mL/min (ref >60)
GFR calc non Af Amer: >60 mL/min (ref >60)
Glucose, Bld: 101 mg/dL — ABNORMAL HIGH (ref 70–99)
Potassium: 3.5 mmol/L (ref 3.5–5.1)
Sodium: 137 mmol/L (ref 135–145)

## 2018-11-20 LAB — CBC
HCT: 24.1 % — ABNORMAL LOW (ref 36.0–46.0)
Hemoglobin: 8.2 g/dL — ABNORMAL LOW (ref 12.0–15.0)
MCH: 31.7 pg (ref 26.0–34.0)
MCHC: 34 g/dL (ref 30.0–36.0)
MCV: 93.1 fL (ref 80.0–100.0)
Platelets: 189 10*3/uL (ref 150–400)
RBC: 2.59 MIL/uL — ABNORMAL LOW (ref 3.87–5.11)
RDW: 14.6 % (ref 11.5–15.5)
WBC: 12.4 10*3/uL — ABNORMAL HIGH (ref 4.0–10.5)
nRBC: 4 % — ABNORMAL HIGH (ref 0.0–0.2)

## 2018-11-20 LAB — GLUCOSE, CAPILLARY
Glucose-Capillary: 102 mg/dL — ABNORMAL HIGH (ref 70–99)
Glucose-Capillary: 74 mg/dL (ref 70–99)
Glucose-Capillary: 85 mg/dL (ref 70–99)
Glucose-Capillary: 91 mg/dL (ref 70–99)
Glucose-Capillary: 97 mg/dL (ref 70–99)
Glucose-Capillary: 99 mg/dL (ref 70–99)

## 2018-11-20 LAB — HEMOGLOBIN AND HEMATOCRIT, BLOOD
HCT: 19.6 % — ABNORMAL LOW (ref 36.0–46.0)
Hemoglobin: 6.9 g/dL — CL (ref 12.0–15.0)

## 2018-11-20 LAB — PREPARE RBC (CROSSMATCH)

## 2018-11-20 LAB — T3, FREE: T3, Free: 2 pg/mL (ref 2.0–4.4)

## 2018-11-20 LAB — MAGNESIUM: Magnesium: 2.2 mg/dL (ref 1.7–2.4)

## 2018-11-20 LAB — PHOSPHORUS: Phosphorus: 2.5 mg/dL (ref 2.5–4.6)

## 2018-11-20 SURGERY — EGD (ESOPHAGOGASTRODUODENOSCOPY)
Anesthesia: Monitor Anesthesia Care

## 2018-11-20 SURGERY — ESOPHAGOGASTRODUODENOSCOPY (EGD) WITH PROPOFOL
Anesthesia: Monitor Anesthesia Care

## 2018-11-20 MED ORDER — HEPARIN (PORCINE) 25000 UT/250ML-% IV SOLN: 950.0000 [IU]/h | INTRAVENOUS | Status: DC

## 2018-11-20 MED ORDER — ACETAMINOPHEN 325 MG PO TABS
650.0000 mg | ORAL_TABLET | Freq: Four times a day (QID) | ORAL | Status: DC | PRN
  Administered 2018-11-24 – 2018-11-26 (×2): 650 mg via ORAL
  Filled 2018-11-20 (×2): qty 2

## 2018-11-20 MED ORDER — ASPIRIN EC 81 MG PO TBEC
81.0000 mg | DELAYED_RELEASE_TABLET | Freq: Every day | ORAL | Status: DC
  Administered 2018-11-20: 81 mg via ORAL
  Filled 2018-11-20 (×2): qty 1

## 2018-11-20 MED ORDER — SODIUM CHLORIDE 0.9 % IV SOLN: INTRAVENOUS | Status: DC

## 2018-11-20 MED ORDER — LIDOCAINE 2% (20 MG/ML) 5 ML SYRINGE
INTRAMUSCULAR | Status: DC | PRN
  Administered 2018-11-20: 40 mg via INTRAVENOUS

## 2018-11-20 MED ORDER — HEPARIN (PORCINE) 25000 UT/250ML-% IV SOLN
1050.0000 [IU]/h | INTRAVENOUS | Status: AC
  Administered 2018-11-20: 950 [IU]/h via INTRAVENOUS
  Filled 2018-11-20: qty 250

## 2018-11-20 MED ORDER — PEG 3350-KCL-NA BICARB-NACL 420 G PO SOLR
4000.0000 mL | Freq: Once | ORAL | Status: AC
  Administered 2018-11-20: 4000 mL via ORAL
  Filled 2018-11-20 (×2): qty 4000

## 2018-11-20 MED ORDER — SODIUM CHLORIDE 0.9 % IV SOLN
INTRAVENOUS | Status: DC
  Administered 2018-11-20: 11:00:00 via INTRAVENOUS

## 2018-11-20 MED ORDER — SODIUM CHLORIDE 0.9% IV SOLUTION
Freq: Once | INTRAVENOUS | Status: AC
  Administered 2018-11-20: 02:00:00 via INTRAVENOUS

## 2018-11-20 MED ORDER — PROPOFOL 500 MG/50ML IV EMUL
INTRAVENOUS | Status: DC | PRN
  Administered 2018-11-20: 100 ug/kg/min via INTRAVENOUS

## 2018-11-20 NOTE — Op Note (Signed)
EGD was performed for melena and anemia requiring blood transfusion in a patient with metallic mitral and aortic valve, on Coumadin, INR has been subsequently reversed with FFP and vitamin K, IV Heparin was discontinued 3 hours prior to procedure.  Findings: Normal-appearing esophagus. Widely patent Schatzki's ring at 36 cm from insertion. Erythematous pyloric channel with a small ulcer with pigmented material.  This was treated with BiCAP cautery. Otherwise unremarkable stomach including cardia and fundus on retroflexion. Normal-appearing duodenal bulb and duodenum.   Recommendation: Clear liquid diet. Colonic prep today. Colonoscopy in a.m.. Okay to resume IV heparin, IV heparin to be held at 5 AM tomorrow.  [name], MD

## 2018-11-20 NOTE — Interval H&P Note (Signed)
History and Physical Interval Note: 58/female with melena and anemia requiring PRBC transfusion, was on coumadin, INR has been corrected, has metallic mitral and aortic valve and heparin was stopped at 6 am today, she is to get a diagnostic EGD today.  11/20/2018 9:20 AM  [name]  has presented today for Melena and anemia with the diagnosis of gi bleed.  The various methods of treatment have been discussed with the patient and family. After consideration of risks, benefits and other options for treatment, the patient has consented to  Procedure(s): ESOPHAGOGASTRODUODENOSCOPY (EGD) (N/A) as a surgical intervention.  The patient's history has been reviewed, patient examined, no change in status, stable for surgery.  I have reviewed the patient's chart and labs.  Questions were answered to the patient's satisfaction.     [name]

## 2018-11-20 NOTE — Progress Notes (Signed)
NAME:  Sharon MinerLinda Figueroa, MRN:  [id number], DOB:  06/30/1960, LOS: 2 ADMISSION DATE:  11/18/2018, CONSULTATION DATE:  11/18/2018 REFERRING MD:  Emergency Department, CHIEF COMPLAINT:  GI bleed   Brief History   58 yo F with a history of aortic and mitral mechanical valve replacements on warfarin who presented with dark stools, and hypotension and hemoglobin of 3.4 and INR >10.  INR now 1.4, Hgb stabilized. EGD on 9/17 found 4 mm non-bleeding gastric ulcer which was successfully coagulated.  History of present illness   Patient is normally on warfarin with goal of 3-3.5 for her mechanical valve replacements (history of rheumatic fever as a child).  She started having profuse diarrhea with black stools 3 days ago on Saturday.  No vomiting, and no pain, but did have decreased appetite and did not eat much.  She kept taking her medications- warfarin, ASA 81, and metoprolol.  She denies any NSAIDs, denies history of ulcers or GI bleed in the past.  No infectious symptoms- no fevers, chills, cough, shortness of breath.  She did pass out 3 times in the last couple days, but denies ever hitting her head.  She is very lightheaded with sitting up and standing up, but no lightheadedness with lying flat.    In the ED, prior to me seeing her, she was hypotensive in the 70s/50s, mentating fine laying down.  She had received 1 unit PRBCs in addition to 2L IV fluids.  BP was starting to improve- up to 90s/50s after receiving the unit.  GI was called by the ED, and are planning to see patient in the AM.  Laboratory eval significant for INR > 10.  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 9/18 Colonoscopy>>  Consults:  GI  Procedures:  9/17 EGD>>- 4 mm non-bleeding gastric ulcer with pigmented material, treated  with bipolar cautery and erythematous mucosa in the antrum.  Significant Diagnostic Tests:    Micro Data:  COVID negative H pylori test pending  Antimicrobials:  None  Interval/Subjective:  Received an additional u PRBC this am for Hgb6.9 EGD performed this am as above  Objective   Blood pressure 100/72, pulse 89, temperature 99 F (37.2 C), temperature source Oral, resp. rate 17, height 5' 6.5" (1.689 m), weight 80.2 kg, SpO2 95 %.        Intake/Output Summary (Last 24 hours) at 11/20/2018 1347 Last data filed at 11/20/2018 1200 Gross per 24 hour  Intake 2968.89 ml  Output 550 ml  Net 2418.89 ml   Filed Weights   11/18/18 2300 11/19/18 0500 11/20/18 0500  Weight: 82.3 kg 81.5 kg 80.2 kg    Examination:  General: pleasant woman, comfortable in bed HENT: Op clear, no icterus Lungs: clear B, no crackles or wheezes Cardiovascular: regular, 2/6 M with click, no edema Abdomen: soft, non-tender, + BS Extremities: no edema Neuro: awake, alert, answers questions, non-focal  Resolved Hospital Problem list   Hemorrhagic shock  Assessment & Plan:  58 yo F with acute blood loss anemia due to GI bleed, and supratherapeutic INR.    Acute blood loss anemia: s/p 3 units PRBC. Of note Hgb in 04/2017 was 8.5. GI bleed, likely upper GI bleed. EGD on 9/17 identified 4 mm non-bleeding gastric ulcer, successfully cauterized Supratherapeutic INR: > 10 on admit. s/p Vit K and 2  units FFP 9/15 Continue to follow CBC, insure stability on heparin. No active bleeding noted from gastric ulcer on EGD this am. Targeting low-end therapeutic heparin Continue PPI BID Plan for CSY on 9/18 per GI  Will check H pylori  Hypotension: maps in low 60s intermittently. Patient reports normal SBP in 90s.  Improved with blood / volume resuscitation.   Aortic and mitral mechanical valves (hx rheumatic disease) Chronic warfarin use with goal INR 3-3.5 Coumadin, ASA, b-blockade all on hold Heparin gtt  for anti-coag for valves  Leukocytosis: likely reactive Follow   Best practice:  Diet: Clear liquid diet Pain/Anxiety/Delirium protocol (if indicated): N/A VAP protocol (if indicated): n/a DVT prophylaxis: SCDs (active bleeding) GI prophylaxis: ppi Glucose control: q4h glucose checks Mobility: n/a Code Status: Full Family Communication: patient updated Disposition: Will keep in ICU pending heparin infusion in setting of GI bleed.   Labs   CBC: Recent Labs  Lab 11/18/18 1731 11/18/18 2245  11/19/18 0411 11/19/18 1239 11/19/18 1836 11/19/18 2356 11/20/18 0801  WBC 29.9* 19.4*  --  18.3*  --   --   --  12.4*  NEUTROABS 23.0*  --   --   --   --   --   --   --   HGB 3.4* 5.5*   < > 7.4* 7.6* 7.5* 6.9* 8.2*  HCT 10.2* 16.0*   < > 20.9* 21.1* 22.1* 19.6* 24.1*  MCV 99.0 95.2  --  91.3  --   --   --  93.1  PLT 239 164  --  171  --   --   --  189   < > = values in this interval not displayed.    Basic Metabolic Panel: Recent Labs  Lab 11/18/18 1731 11/19/18 0411 11/19/18 1836 11/20/18 0801  NA 134* 137 138 137  K 3.7 3.4* 3.7 3.5  CL 105 108 106 104  CO2 20* 21* 25 25  GLUCOSE 137* 122* 104* 101*  BUN 43* 24* 15 10  CREATININE 1.10* 0.68 0.61 0.61  CALCIUM 7.6* 7.8* 7.9* 7.5*  MG 1.6* 2.4 2.2 2.2  PHOS  --  2.2* 1.6* 2.5   GFR: Estimated Creatinine Clearance: 80.8 mL/min (by C-G formula based on SCr of 0.61 mg/dL). Recent Labs  Lab 11/18/18 1731 11/18/18 1732 11/18/18 1932 11/18/18 2245 11/19/18 0209 11/19/18 0411 11/20/18 0801  WBC 29.9*  --   --  19.4*  --  18.3* 12.4*  LATICACIDVEN  --  4.4* 3.1* 1.8 1.8  --   --     Liver Function Tests: Recent Labs  Lab 11/18/18 1731  AST 17  ALT 12  ALKPHOS 37*  BILITOT 0.5  PROT 4.5*  ALBUMIN 2.7*   Recent Labs  Lab 11/18/18 1731  LIPASE 31   No results for input(s): AMMONIA in the last 168 hours.  ABG    Component Value Date/Time   PHART 7.375 04/04/2017 2009   PCO2ART 31.8 (L) 04/04/2017 2009    PO2ART 55.0 (L) 04/04/2017 2009   HCO3 18.7 (L) 04/04/2017 2009   TCO2 27 04/06/2017 1551   ACIDBASEDEF 6.0 (H) 04/04/2017 2009   O2SAT 89.0 04/04/2017 2009     Coagulation Profile: Recent Labs  Lab 11/18/18 1731 11/19/18 0411 11/20/18 0801  INR >10.0* 1.4* 1.1    Cardiac Enzymes: No results for input(s): CKTOTAL, CKMB, CKMBINDEX, TROPONINI in the last 168 hours.  HbA1C: Hgb A1c MFr Bld  Date/Time Value Ref Range Status  03/30/2017 05:21 PM 5.3 4.8 -  5.6 % Final    Comment:    (NOTE) Pre diabetes:          5.7%-6.4% Diabetes:              >6.4% Glycemic control for   <7.0% adults with diabetes     CBG: Recent Labs  Lab 11/19/18 1932 11/19/18 2356 11/20/18 0421 11/20/18 0824 11/20/18 1200  GLUCAP 91 119* 102* 99 91   Independent CC time 31 minutes  Baltazar Apo, MD, PhD 11/20/2018, 1:56 PM [facility] [phone] or if no answer [phone]

## 2018-11-20 NOTE — Transfer of Care (Signed)
Immediate Anesthesia Transfer of Care Note  Patient: Sharon Figueroa  Procedure(s) Performed: ESOPHAGOGASTRODUODENOSCOPY (EGD) (N/A ) HOT HEMOSTASIS (ARGON PLASMA COAGULATION/BICAP) (N/A )  Patient Location: Endoscopy Unit  Anesthesia Type:MAC  Level of Consciousness: awake, alert  and oriented  Airway & Oxygen Therapy: Patient Spontanous Breathing and Patient connected to nasal cannula oxygen  Post-op Assessment: Report given to RN and Post -op Vital signs reviewed and stable  Post vital signs: Reviewed and stable  Last Vitals:  Vitals Value Taken Time  BP 94/58 11/20/18 0955  Temp 36.9 C 11/20/18 0941  Pulse 89 11/20/18 1006  Resp 22 11/20/18 1006  SpO2 96 % 11/20/18 1006  Vitals shown include unvalidated device data.  Last Pain:  Vitals:   11/20/18 0955  TempSrc:   PainSc: 0-No pain         Complications: No apparent anesthesia complications

## 2018-11-20 NOTE — Anesthesia Procedure Notes (Signed)
Procedure Name: MAC Date/Time: 11/20/2018 9:22 AM Performed by: Alain Marion, CRNA Pre-anesthesia Checklist: Patient identified, Emergency Drugs available, Suction available, Patient being monitored and Timeout performed Oxygen Delivery Method: Nasal cannula Placement Confirmation: positive ETCO2

## 2018-11-20 NOTE — Progress Notes (Signed)
ANTICOAGULATION CONSULT NOTE - Initial Consult  Pharmacy Consult for heparin Indication: atrial fibrillation and valve replacement  No Known Allergies  Patient Measurements: Height: 5' 6.5" (168.9 cm) Weight: 176 lb 12.9 oz (80.2 kg) IBW/kg (Calculated) : 60.45 Heparin Dosing Weight: 76  Vital Signs: Temp: 98.4 F (36.9 C) (09/17 0941) Temp Source: Temporal (09/17 0941) BP: 94/58 (09/17 0955) Pulse Rate: 85 (09/17 0955)  Labs: Recent Labs    11/18/18 1731 11/18/18 1932 11/18/18 2245  11/19/18 0411  11/19/18 1836 11/19/18 2356 11/20/18 0801  HGB 3.4*  --  5.5*   < > 7.4*   < > 7.5* 6.9* 8.2*  HCT 10.2*  --  16.0*   < > 20.9*   < > 22.1* 19.6* 24.1*  PLT 239  --  164  --  171  --   --   --  189  LABPROT >90.0*  --   --   --  17.2*  --   --   --  14.4  INR >10.0*  --   --   --  1.4*  --   --   --  1.1  HEPARINUNFRC  --   --   --   --   --   --  0.39  --  0.11*  CREATININE 1.10*  --   --   --  0.68  --  0.61  --  0.61  TROPONINIHS 16 19*  --   --   --   --   --   --   --    < > = values in this interval not displayed.    Estimated Creatinine Clearance: 80.8 mL/min (by C-G formula based on SCr of 0.61 mg/dL).  Assessment: CC/HPI: dark colored diarrhea, dizziness x 4 days; hypotension/syncope while using the bathroom - initial Hgb 3.4, INR > 10. Reversed warfarin with vit K FFP  PMH: rheumatic heart disease, mitral and aortic valve replacement, chronic on warfarin; Afib  S/p EGD this am - planning colonoscopy tomorrow GI wants heparin resumed  Goal of Therapy:  Heparin level 0.3 - 0.5 units/ml Monitor platelets by anticoagulation protocol: Yes   Plan:  Resume heparin 950 units/hr  1800 HL DC heparin at 0500 9/18 for colonoscopy Daily HL CBC  Levester Fresh, PharmD, BCPS, BCCCP Clinical Pharmacist [phone]  Please check AMION for all [place] numbers  11/20/2018 10:25 AM

## 2018-11-20 NOTE — Progress Notes (Signed)
[facility] Progress Note Patient Name: Sharon Figueroa DOB: 1960/07/02 MRN: [id number]   Date of Service  11/20/2018  HPI/Events of Note  Anemia - Hgb = 6.9.   eICU Interventions  Will transfuse 1 unit PRBC.     Intervention Category Major Interventions: Other:  Sharon Figueroa 11/20/2018, 1:20 AM

## 2018-11-20 NOTE — Op Note (Signed)
[facility] Patient Name: Sharon MinerLinda Mousel Procedure Date : 11/20/2018 MRN: [id number] Attending MD: Kerin SalenArya Angela Platner , MD Date of Birth: 10/06/1960 CSN: [id number] Age: 58 Admit Type: Inpatient Procedure:                Upper GI endoscopy Indications:               Providers:                Kerin SalenArya Analycia Khokhar, MD, Zoe LanJenny Kappus, RN, Arlee Muslimhris Chandler                            Tech., Technician, Marc MorgansShaina Gold, Technician, Larina BrasEmilee                            Smith Referring MD:              Medicines:                Monitored Anesthesia Care Complications:            No immediate complications. Estimated blood loss:                            None. Estimated Blood Loss:     Estimated blood loss: none. Procedure:                Pre-Anesthesia Assessment:                           - Prior to the procedure, a History and Physical                            was performed, and patient medications and                            allergies were reviewed. The patient's tolerance of                            previous anesthesia was also reviewed. The risks                            and benefits of the procedure and the sedation                            options and risks were discussed with the patient.                            All questions were answered, and informed consent                            was obtained. Prior Anticoagulants: The patient has                            taken Coumadin (warfarin), last dose was 2 days                            prior to procedure.She was on  IV Heparin 3 hours                            prior to procedure. ASA Grade Assessment: III - A                            patient with severe systemic disease. After                            reviewing the risks and benefits, the patient was                            deemed in satisfactory condition to undergo the                            procedure.                           After obtaining informed consent, the  endoscope was                            passed under direct vision. Throughout the                            procedure, the patient's blood pressure, pulse, and                            oxygen saturations were monitored continuously. The                            GIF-H190 ([id number] Olympus gastroscope was                            introduced through the mouth, and advanced to the                            jejunum. Scope In: Scope Out: Findings:      The examined esophagus was normal.      A widely patent Schatzki ring was found at the gastroesophageal junction.      A 2 cm hiatal hernia was present.      One non-bleeding cratered gastric ulcer with pigmented material was       found at the pylorus. The lesion was 4 mm in largest dimension.       Coagulation for hemostasis using bipolar probe was successful.      Patchy moderately erythematous mucosa without bleeding was found in the       gastric antrum.      The cardia and gastric fundus were normal on retroflexion.      The examined duodenum was normal. Impression:               - Normal esophagus.                           - Widely patent Schatzki ring.                           -  2 cm hiatal hernia.                           - Non-bleeding gastric ulcer with pigmented                            material. Treated with bipolar cautery.                           - Erythematous mucosa in the antrum.                           - Normal examined duodenum.                           - No specimens collected. Moderate Sedation:      Patient did not receive moderate sedation for this procedure, but       instead received monitored anesthesia care. Recommendation:           - Clear liquid diet.                           - Continue present medications.                           - Perform a colonoscopy tomorrow.                           - H pylori serology. Procedure Code(s):        --- Professional ---                            [phone], Esophagogastroduodenoscopy, flexible,                            transoral; with control of bleeding, any method Diagnosis Code(s):        --- Professional ---                           K22.2, Esophageal obstruction                           K44.9, Diaphragmatic hernia without obstruction or                            gangrene                           K25.9, Gastric ulcer, unspecified as acute or                            chronic, without hemorrhage or perforation                           K31.89, Other diseases of stomach and duodenum CPT copyright 2019 [employer]. All rights reserved. The codes documented in this report are preliminary and upon coder review may  be revised to meet current compliance requirements. Marcos Eke  Therisa Doyne, MD 11/20/2018 9:47:48 AM This report has been signed electronically. Number of Addenda: 0

## 2018-11-20 NOTE — Brief Op Note (Signed)
11/18/2018 - 11/20/2018  9:35 AM  PATIENT:  Sharon Figueroa  58 y.o. female  PRE-OPERATIVE DIAGNOSIS:  gi bleed  POST-OPERATIVE DIAGNOSIS:  small antral ulcer  PROCEDURE:  Procedure(s): ESOPHAGOGASTRODUODENOSCOPY (EGD) (N/A) HOT HEMOSTASIS (ARGON PLASMA COAGULATION/BICAP) (N/A)  SURGEON:  Surgeon(s) and Role:    [name], MD - Primary  PHYSICIAN ASSISTANT:   ASSISTANTS: Kingsley Plan, RN, Elspeth Cho, Tech  ANESTHESIA:   MAC  EBL:  None  BLOOD ADMINISTERED:none  DRAINS: none   LOCAL MEDICATIONS USED:  NONE  SPECIMEN:  No Specimen  DISPOSITION OF SPECIMEN:  N/A  COUNTS:  YES  TOURNIQUET:  * No tourniquets in log *  DICTATION: .Dragon Dictation  PLAN OF CARE: Admit to inpatient   PATIENT DISPOSITION:  PACU - hemodynamically stable.   Delay start of Pharmacological VTE agent (>24hrs) due to surgical blood loss or risk of bleeding: yes

## 2018-11-20 NOTE — Progress Notes (Signed)
ANTICOAGULATION CONSULT NOTE - Follow-Up Consult  Pharmacy Consult for heparin Indication: atrial fibrillation and valve replacement  No Known Allergies  Patient Measurements: Height: 5' 6.5" (168.9 cm) Weight: 176 lb 12.9 oz (80.2 kg) IBW/kg (Calculated) : 60.45 Heparin Dosing Weight: 76  Vital Signs: Temp: 98.4 F (36.9 C) (09/17 1635) Temp Source: Oral (09/17 1100) BP: 105/70 (09/17 1800) Pulse Rate: 100 (09/17 1800)  Labs: Recent Labs    11/18/18 1731 11/18/18 1932 11/18/18 2245  11/19/18 0411  11/19/18 1836 11/19/18 2356 11/20/18 0801 11/20/18 1800  HGB 3.4*  --  5.5*   < > 7.4*   < > 7.5* 6.9* 8.2*  --   HCT 10.2*  --  16.0*   < > 20.9*   < > 22.1* 19.6* 24.1*  --   PLT 239  --  164  --  171  --   --   --  189  --   LABPROT >90.0*  --   --   --  17.2*  --   --   --  14.4  --   INR >10.0*  --   --   --  1.4*  --   --   --  1.1  --   HEPARINUNFRC  --   --   --   --   --   --  0.39  --  0.11* 0.19*  CREATININE 1.10*  --   --   --  0.68  --  0.61  --  0.61  --   TROPONINIHS 16 19*  --   --   --   --   --   --   --   --    < > = values in this interval not displayed.    Estimated Creatinine Clearance: 80.8 mL/min (by C-G formula based on SCr of 0.61 mg/dL).  Assessment: CC/HPI: dark colored diarrhea, dizziness x 4 days; hypotension/syncope while using the bathroom - initial Hgb 3.4, INR > 10. Reversed warfarin with vit K FFP  PMH: rheumatic heart disease, mitral and aortic valve replacement, chronic on warfarin; Afib  S/p EGD this am - planning colonoscopy tomorrow. GI wants heparin resumed, to stop at 0500 for colonoscopy. Initial heparin level below goal, will increase slightly and defer recheck with heparin stopping soon.   Goal of Therapy:  Heparin level 0.3 - 0.5 units/ml Monitor platelets by anticoagulation protocol: Yes   Plan:  -Increase heparin to 1050 units/hr -Stop IV heparin at [place], PharmD, BCPS Clinical  Pharmacist [phone] Please check AMION for all [place] numbers 11/20/2018

## 2018-11-20 NOTE — Progress Notes (Signed)
NAME:  Sharon Figueroa, MRN:  [id number], DOB:  [birth year], LOS: 2 ADMISSION DATE:  11/18/2018, CONSULTATION DATE:  11/18/2018 REFERRING MD:  Emergency Department, CHIEF COMPLAINT:  GI bleed   Brief History   58 yo F with a history of aortic and mitral mechanical valve replacements on warfarin who presented with dark stools, and hypotension and hemoglobin of 3.4 and INR >10.  INR now 1.4, Hgb stabilized. EGD on 9/17 found 4 mm non-bleeding gastric ulcer which was successfully coagulated.  History of present illness   Patient is normally on warfarin with goal of 3-3.5 for her mechanical valve replacements (history of rheumatic fever as a child).  She started having profuse diarrhea with black stools 3 days ago on Saturday.  No vomiting, and no pain, but did have decreased appetite and did not eat much.  She kept taking her medications- warfarin, ASA 81, and metoprolol.  She denies any NSAIDs, denies history of ulcers or GI bleed in the past.  No infectious symptoms- no fevers, chills, cough, shortness of breath.  She did pass out 3 times in the last couple days, but denies ever hitting her head.  She is very lightheaded with sitting up and standing up, but no lightheadedness with lying flat.    In the ED, prior to me seeing her, she was hypotensive in the 70s/50s, mentating fine laying down.  She had received 1 unit PRBCs in addition to 2L IV fluids.  BP was starting to improve- up to 90s/50s after receiving the unit.  GI was called by the ED, and are planning to see patient in the AM.  Laboratory eval significant for INR > 10.  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 9/18 Colonoscopy>>  Consults:  GI  Procedures:  9/17 EGD>>- 4 mm non-bleeding gastric ulcer with pigmented material, treated  with bipolar cautery and erythematous mucosa in the antrum.  Significant Diagnostic Tests:    Micro Data:  COVID negative H pylori test pending  Antimicrobials:  None  Interval/Subjective:  No acute events overnight. Reports her fatigue has improved significantly today. Denies n/v, hemoptysis, hematochezia, abdominal pain, fever/chills, shortness of breath, and chest pain. No stools since Monday.  Objective   Blood pressure (!) 94/58, pulse 85, temperature 98.4 F (36.9 C), temperature source Temporal, resp. rate 18, height 5' 6.5" (1.689 m), weight 80.2 kg, SpO2 100 %.        Intake/Output Summary (Last 24 hours) at 11/20/2018 1032 Last data filed at 11/20/2018 2993 Gross per 24 hour  Intake 3272.43 ml  Output 550 ml  Net 2722.43 ml   Filed Weights   11/18/18 2300 11/19/18 0500 11/20/18 0500  Weight: 82.3 kg 81.5 kg 80.2 kg    Examination:  General: Pleasant, middle-aged female in no acute distress resting in bed HENT: Normocephalic, atraumatic. Anicteric sclerae Lungs: Clear breath sounds bilaterally with symmetric chest wall expansion and normal WOB on RA Cardiovascular: Regular rate, irregular rhythm. PAC's noted on telemetry. Systolic click. No murmurs or rubs. 1+ radial pulse. Abdomen: soft, non-distended. Non-tender Extremities: no lower extremity edema. Neuro: alert + oriented x 4, no gross deficits  Resolved Hospital Problem list   Hemorrhagic shock  Assessment & Plan:  58 yo F with acute blood loss anemia due to GI bleed, and supratherapeutic INR.    Acute blood  loss anemia: s/p 3 units PRBC. Of note Hgb in 04/2017 was 8.5. GI bleed, likely upper GI bleed. EGD on 9/17 identified 4 mm non-bleeding gastric ulcer, successfully cauterized Supratherapeutic INR: > 10 on admit. s/p Vit K and 2 units FFP 9/15 - ppi BID - GI follownig - Clear liquid diet for colonoscopy on 9/18 - continue trend H&H q6H - trend INR (1.4 today) - Continue heparin for AC given  mechanical mitral/aortic valves. No bolus. Dosing per pharmacy. Target low end of therapeutic range.  - hold home warfarin, ASA.  - GI recommends H pylori testing  Hypotension: maps in low 60s intermittently. Patient reports normal SBP in 90s.  - Continue maintenance  IVF - Blood products depending of H&H results.   Aortic and mitral mechanical valves (hx rheumatic disease) Chronic warfarin use with goal INR 3-3.5 - holding warfarin, asa, metoprolol.   Leukocytosis: likely reactive - trend CBC  Best practice:  Diet: Clear liquid diet Pain/Anxiety/Delirium protocol (if indicated): N/A VAP protocol (if indicated): n/a DVT prophylaxis: SCDs (active bleeding) GI prophylaxis: ppi Glucose control: q4h glucose checks Mobility: n/a Code Status: Full Family Communication: patient updated Disposition: Will keep in ICU pending heparin infusion in setting of GI bleed.   Labs   CBC: Recent Labs  Lab 11/18/18 1731 11/18/18 2245  11/19/18 0411 11/19/18 1239 11/19/18 1836 11/19/18 2356 11/20/18 0801  WBC 29.9* 19.4*  --  18.3*  --   --   --  12.4*  NEUTROABS 23.0*  --   --   --   --   --   --   --   HGB 3.4* 5.5*   < > 7.4* 7.6* 7.5* 6.9* 8.2*  HCT 10.2* 16.0*   < > 20.9* 21.1* 22.1* 19.6* 24.1*  MCV 99.0 95.2  --  91.3  --   --   --  93.1  PLT 239 164  --  171  --   --   --  189   < > = values in this interval not displayed.    Basic Metabolic Panel: Recent Labs  Lab 11/18/18 1731 11/19/18 0411 11/19/18 1836 11/20/18 0801  NA 134* 137 138 137  K 3.7 3.4* 3.7 3.5  CL 105 108 106 104  CO2 20* 21* 25 25  GLUCOSE 137* 122* 104* 101*  BUN 43* 24* 15 10  CREATININE 1.10* 0.68 0.61 0.61  CALCIUM 7.6* 7.8* 7.9* 7.5*  MG 1.6* 2.4 2.2 2.2  PHOS  --  2.2* 1.6* 2.5   GFR: Estimated Creatinine Clearance: 80.8 mL/min (by C-G formula based on SCr of 0.61 mg/dL). Recent Labs  Lab 11/18/18 1731 11/18/18 1732 11/18/18 1932 11/18/18 2245 11/19/18 0209 11/19/18 0411 11/20/18  0801  WBC 29.9*  --   --  19.4*  --  18.3* 12.4*  LATICACIDVEN  --  4.4* 3.1* 1.8 1.8  --   --     Liver Function Tests: Recent Labs  Lab 11/18/18 1731  AST 17  ALT 12  ALKPHOS 37*  BILITOT 0.5  PROT 4.5*  ALBUMIN 2.7*   Recent Labs  Lab 11/18/18 1731  LIPASE 31   No results for input(s): AMMONIA in the last 168 hours.  ABG    Component Value Date/Time   PHART 7.375 04/04/2017 2009   PCO2ART 31.8 (L) 04/04/2017 2009   PO2ART 55.0 (L) 04/04/2017 2009   HCO3 18.7 (L) 04/04/2017 2009   TCO2 27 04/06/2017 1551   ACIDBASEDEF 6.0 (H) 04/04/2017 2009  O2SAT 89.0 04/04/2017 2009     Coagulation Profile: Recent Labs  Lab 11/18/18 1731 11/19/18 0411 11/20/18 0801  INR >10.0* 1.4* 1.1    Cardiac Enzymes: No results for input(s): CKTOTAL, CKMB, CKMBINDEX, TROPONINI in the last 168 hours.  HbA1C: Hgb A1c MFr Bld  Date/Time Value Ref Range Status  03/30/2017 05:21 PM 5.3 4.8 - 5.6 % Final    Comment:    (NOTE) Pre diabetes:          5.7%-6.4% Diabetes:              >6.4% Glycemic control for   <7.0% adults with diabetes     CBG: Recent Labs  Lab 11/19/18 1516 11/19/18 1932 11/19/18 2356 11/20/18 0421 11/20/18 0824  GLUCAP 93 91 119* 102* 99    Review of Systems:   ROS negative except for above in HPI  Past Medical History  She,  has a past medical history of Mitral valve disorder.   Surgical History    Past Surgical History:  Procedure Laterality Date  . AORTIC VALVE REPLACEMENT N/A 04/04/2017   Procedure: AORTIC VALVE REPLACEMENT (AVR);  Surgeon: Alleen BorneBartle, Bryan K, MD;  [facility];  Service: Open Heart Surgery;  Laterality: N/A;  . CARDIAC SURGERY    . MAZE N/A 04/04/2017   Procedure: MAZE;  Surgeon: Alleen BorneBartle, Bryan K, MD;  [facility];  Service: Open Heart Surgery;  Laterality: N/A;  . MITRAL VALVE REPLACEMENT N/A 04/04/2017   Procedure: MITRAL VALVE (MV) REPLACEMENT;  Surgeon: Alleen BorneBartle, Bryan K, MD;  Location: [facility] OR;  Service: Open Heart  Surgery;  Laterality: N/A;  . No prior surgery    . RIGHT/LEFT HEART CATH AND CORONARY ANGIOGRAPHY N/A 04/01/2017   Procedure: RIGHT/LEFT HEART CATH AND CORONARY ANGIOGRAPHY;  Surgeon: Runell GessBerry, Kyli Sorter J, MD;  Location: [facility] INVASIVE CV LAB;  Service: Cardiovascular;  Laterality: N/A;  . TEE WITHOUT CARDIOVERSION N/A 04/04/2017   Procedure: TRANSESOPHAGEAL ECHOCARDIOGRAM (TEE);  Surgeon: Alleen BorneBartle, Bryan K, MD;  [facility];  Service: Open Heart Surgery;  Laterality: N/A;     Social History   reports that she quit smoking about 19 months ago. Her smoking use included cigarettes. She smoked 1.00 pack per day. She has never used smokeless tobacco. She reports that she does not drink alcohol or use drugs.   Family History   Her family history includes CVA in her father; Heart attack in her father.   Allergies No Known Allergies   Home Medications  Prior to Admission medications   Medication Sig Start Date End Date Taking? Authorizing Provider  acetaminophen (TYLENOL) 325 MG tablet Take 2 tablets (650 mg total) by mouth every 6 (six) hours as needed for mild pain. 04/09/17   Ardelle BallsZimmerman, Donielle M, PA-C  aspirin EC 81 MG tablet Take 1 tablet (81 mg total) by mouth daily. 08/01/17   Lewayne Buntingrenshaw, Brian S, MD  metoprolol succinate (TOPROL-XL) 25 MG 24 hr tablet Take 0.5 tablets (12.5 mg total) by mouth daily. MUST KEEP APPOINTMENT 01/05/19 WITH DR Jens SomRENSHAW FOR FUTURE REFILLS 10/03/18   Lewayne Buntingrenshaw, Brian S, MD  warfarin (COUMADIN) 5 MG tablet Take 1 to 1 and 1/2 tablets by mouth as directed by coumadin clinic 10/03/18   Lewayne Buntingrenshaw, Brian S, MD     Orville GovernJonathan Shaquasha Gerstel, MS4  11/20/2018 10:32 AM

## 2018-11-20 NOTE — Anesthesia Postprocedure Evaluation (Signed)
Anesthesia Post Note  Patient: Sharon Figueroa  Procedure(s) Performed: ESOPHAGOGASTRODUODENOSCOPY (EGD) (N/A ) HOT HEMOSTASIS (ARGON PLASMA COAGULATION/BICAP) (N/A )     Patient location during evaluation: PACU Anesthesia Type: MAC Level of consciousness: awake and alert Pain management: pain level controlled Vital Signs Assessment: post-procedure vital signs reviewed and stable Respiratory status: spontaneous breathing, nonlabored ventilation, respiratory function stable and patient connected to nasal cannula oxygen Cardiovascular status: stable and blood pressure returned to baseline Postop Assessment: no apparent nausea or vomiting Anesthetic complications: no    Last Vitals:  Vitals:   11/20/18 0941 11/20/18 0955  BP: (!) 81/42 (!) 94/58  Pulse:  85  Resp: (!) 22 18  Temp: 36.9 C   SpO2: 100% 100%    Last Pain:  Vitals:   11/20/18 0955  TempSrc:   PainSc: 0-No pain                 Rolfe Hartsell

## 2018-11-21 ENCOUNTER — Inpatient Hospital Stay (HOSPITAL_COMMUNITY): Payer: BC Managed Care – PPO | Admitting: Anesthesiology

## 2018-11-21 ENCOUNTER — Encounter (HOSPITAL_COMMUNITY)
Admission: EM | Disposition: A | Discharge status: Home / Self Care | Payer: Self-pay | Source: Home / Self Care | Attending: Internal Medicine

## 2018-11-21 ENCOUNTER — Encounter (HOSPITAL_COMMUNITY): Payer: Self-pay | Admitting: *Deleted

## 2018-11-21 DIAGNOSIS — K259 Gastric ulcer, unspecified as acute or chronic, without hemorrhage or perforation: Secondary | ICD-10-CM | POA: Diagnosis not present

## 2018-11-21 DIAGNOSIS — K222 Esophageal obstruction: Secondary | ICD-10-CM | POA: Diagnosis not present

## 2018-11-21 DIAGNOSIS — R578 Other shock: Secondary | ICD-10-CM | POA: Diagnosis not present

## 2018-11-21 DIAGNOSIS — K449 Diaphragmatic hernia without obstruction or gangrene: Secondary | ICD-10-CM | POA: Diagnosis not present

## 2018-11-21 DIAGNOSIS — D120 Benign neoplasm of cecum: Secondary | ICD-10-CM | POA: Diagnosis not present

## 2018-11-21 DIAGNOSIS — K621 Rectal polyp: Secondary | ICD-10-CM | POA: Diagnosis not present

## 2018-11-21 DIAGNOSIS — K3189 Other diseases of stomach and duodenum: Secondary | ICD-10-CM | POA: Diagnosis not present

## 2018-11-21 DIAGNOSIS — K921 Melena: Secondary | ICD-10-CM | POA: Diagnosis not present

## 2018-11-21 DIAGNOSIS — K254 Chronic or unspecified gastric ulcer with hemorrhage: Secondary | ICD-10-CM | POA: Diagnosis not present

## 2018-11-21 DIAGNOSIS — D123 Benign neoplasm of transverse colon: Secondary | ICD-10-CM | POA: Diagnosis not present

## 2018-11-21 HISTORY — PX: POLYPECTOMY: SHX5525

## 2018-11-21 HISTORY — PX: COLONOSCOPY WITH PROPOFOL: SHX5780

## 2018-11-21 HISTORY — PX: BIOPSY: SHX5522

## 2018-11-21 LAB — BPAM RBC
Blood Product Expiration Date: 202010042359
Blood Product Expiration Date: 202010102359
Blood Product Expiration Date: 202010112359
Blood Product Expiration Date: 202010172359
ISSUE DATE / TIME: 202009151818
ISSUE DATE / TIME: 202009151821
ISSUE DATE / TIME: 202009151821
ISSUE DATE / TIME: 202009170212
Unit Type and Rh: 5100
Unit Type and Rh: 6200
Unit Type and Rh: 6200
Unit Type and Rh: 6200

## 2018-11-21 LAB — PROTIME-INR
INR: 1.1 (ref 0.8–1.2)
Prothrombin Time: 14.5 seconds (ref 11.4–15.2)

## 2018-11-21 LAB — TYPE AND SCREEN
ABO/RH(D): A POS
Antibody Screen: NEGATIVE
Unit division: 0
Unit division: 0
Unit division: 0
Unit division: 0

## 2018-11-21 LAB — GLUCOSE, CAPILLARY
Glucose-Capillary: 89 mg/dL (ref 70–99)
Glucose-Capillary: 88 mg/dL (ref 70–99)
Glucose-Capillary: 108 mg/dL — ABNORMAL HIGH (ref 70–99)

## 2018-11-21 LAB — BASIC METABOLIC PANEL
Anion gap: 7 (ref 5–15)
BUN: 5 mg/dL — ABNORMAL LOW (ref 6–20)
CO2: 25 mmol/L (ref 22–32)
Calcium: 8 mg/dL — ABNORMAL LOW (ref 8.9–10.3)
Chloride: 107 mmol/L (ref 98–111)
Creatinine, Ser: 0.62 mg/dL (ref 0.44–1.00)
GFR calc Af Amer: >60 mL/min (ref >60)
GFR calc non Af Amer: >60 mL/min (ref >60)
Glucose, Bld: 100 mg/dL — ABNORMAL HIGH (ref 70–99)
Potassium: 3.2 mmol/L — ABNORMAL LOW (ref 3.5–5.1)
Sodium: 139 mmol/L (ref 135–145)

## 2018-11-21 LAB — HEMOGLOBIN AND HEMATOCRIT, BLOOD
HCT: 28.4 % — ABNORMAL LOW (ref 36.0–46.0)
HCT: 28.4 % — ABNORMAL LOW (ref 36.0–46.0)
Hemoglobin: 9.2 g/dL — ABNORMAL LOW (ref 12.0–15.0)
Hemoglobin: 9.5 g/dL — ABNORMAL LOW (ref 12.0–15.0)

## 2018-11-21 LAB — CBC
HCT: 26.2 % — ABNORMAL LOW (ref 36.0–46.0)
Hemoglobin: 9 g/dL — ABNORMAL LOW (ref 12.0–15.0)
MCH: 32.1 pg (ref 26.0–34.0)
MCHC: 34.4 g/dL (ref 30.0–36.0)
MCV: 93.6 fL (ref 80.0–100.0)
Platelets: 210 10*3/uL (ref 150–400)
RBC: 2.8 MIL/uL — ABNORMAL LOW (ref 3.87–5.11)
RDW: 15.9 % — ABNORMAL HIGH (ref 11.5–15.5)
WBC: 10.1 10*3/uL (ref 4.0–10.5)
nRBC: 1.3 % — ABNORMAL HIGH (ref 0.0–0.2)

## 2018-11-21 LAB — PHOSPHORUS: Phosphorus: 2.1 mg/dL — ABNORMAL LOW (ref 2.5–4.6)

## 2018-11-21 LAB — HEPARIN LEVEL (UNFRACTIONATED): Heparin Unfractionated: 0.27 IU/mL — ABNORMAL LOW (ref 0.30–0.70)

## 2018-11-21 LAB — MAGNESIUM: Magnesium: 2.2 mg/dL (ref 1.7–2.4)

## 2018-11-21 SURGERY — COLONOSCOPY WITH PROPOFOL
Anesthesia: Monitor Anesthesia Care

## 2018-11-21 MED ORDER — POTASSIUM CHLORIDE CRYS ER 20 MEQ PO TBCR
40.0000 meq | EXTENDED_RELEASE_TABLET | Freq: Once | ORAL | Status: AC
  Administered 2018-11-21: 40 meq via ORAL
  Filled 2018-11-21: qty 2

## 2018-11-21 MED ORDER — HEPARIN (PORCINE) 25000 UT/250ML-% IV SOLN
1050.0000 [IU]/h | INTRAVENOUS | Status: DC
  Administered 2018-11-21: 1050 [IU]/h via INTRAVENOUS

## 2018-11-21 MED ORDER — EPHEDRINE SULFATE 50 MG/ML IJ SOLN
INTRAMUSCULAR | Status: DC | PRN
  Administered 2018-11-21 (×2): 10 mg via INTRAVENOUS

## 2018-11-21 MED ORDER — PROPOFOL 10 MG/ML IV BOLUS
INTRAVENOUS | Status: DC | PRN
  Administered 2018-11-21: 30 mg via INTRAVENOUS
  Administered 2018-11-21: 20 mg via INTRAVENOUS
  Administered 2018-11-21: 30 mg via INTRAVENOUS

## 2018-11-21 MED ORDER — HEPARIN (PORCINE) 25000 UT/250ML-% IV SOLN
950.0000 [IU]/h | INTRAVENOUS | Status: DC
  Administered 2018-11-22: 1150 [IU]/h via INTRAVENOUS
  Administered 2018-11-23 – 2018-11-24 (×2): 950 [IU]/h via INTRAVENOUS
  Filled 2018-11-21 (×4): qty 250

## 2018-11-21 MED ORDER — K PHOS MONO-SOD PHOS DI & MONO 155-852-130 MG PO TABS
500.0000 mg | ORAL_TABLET | Freq: Three times a day (TID) | ORAL | Status: AC
  Administered 2018-11-21 – 2018-11-22 (×3): 500 mg via ORAL
  Filled 2018-11-21 (×3): qty 2

## 2018-11-21 MED ORDER — LIDOCAINE HCL (CARDIAC) PF 100 MG/5ML IV SOSY
PREFILLED_SYRINGE | INTRAVENOUS | Status: DC | PRN
  Administered 2018-11-21: 80 mg via INTRATRACHEAL

## 2018-11-21 MED ORDER — POTASSIUM PHOSPHATES 15 MMOLE/5ML IV SOLN
30.0000 mmol | Freq: Once | INTRAVENOUS | Status: DC
  Administered 2018-11-21: 30 mmol via INTRAVENOUS
  Filled 2018-11-21: qty 10

## 2018-11-21 MED ORDER — LACTATED RINGERS IV SOLN
INTRAVENOUS | Status: DC
  Administered 2018-11-21: 10:00:00 via INTRAVENOUS

## 2018-11-21 MED ORDER — HEPARIN (PORCINE) 25000 UT/250ML-% IV SOLN: 1050.0000 [IU]/h | INTRAVENOUS | Status: DC

## 2018-11-21 MED ORDER — PROPOFOL 500 MG/50ML IV EMUL
INTRAVENOUS | Status: DC | PRN
  Administered 2018-11-21: 50 ug/kg/min via INTRAVENOUS

## 2018-11-21 MED ORDER — POTASSIUM PHOSPHATE MONOBASIC 500 MG PO TABS
500.0000 mg | ORAL_TABLET | Freq: Three times a day (TID) | ORAL | Status: DC
  Filled 2018-11-21 (×3): qty 1

## 2018-11-21 MED ORDER — ONDANSETRON HCL 4 MG/2ML IJ SOLN
INTRAMUSCULAR | Status: DC | PRN
  Administered 2018-11-21: 4 mg via INTRAVENOUS

## 2018-11-21 MED ORDER — PHENYLEPHRINE HCL (PRESSORS) 10 MG/ML IV SOLN
INTRAVENOUS | Status: DC | PRN
  Administered 2018-11-21: 80 ug via INTRAVENOUS
  Administered 2018-11-21: 120 ug via INTRAVENOUS
  Administered 2018-11-21: 80 ug via INTRAVENOUS
  Administered 2018-11-21: 120 ug via INTRAVENOUS

## 2018-11-21 SURGICAL SUPPLY — 21 items

## 2018-11-21 NOTE — Progress Notes (Signed)
ANTICOAGULATION CONSULT NOTE - Follow-Up Consult  Pharmacy Consult for heparin Indication: atrial fibrillation and valve replacement  No Known Allergies  Patient Measurements: Height: 5' 6.5" (168.9 cm) Weight: 176 lb 12.9 oz (80.2 kg) IBW/kg (Calculated) : 60.45 Heparin Dosing Weight: 76  Vital Signs: Temp: 98.9 F (37.2 C) (09/18 1554) Temp Source: Oral (09/18 1554) BP: 92/55 (09/18 1800) Pulse Rate: 94 (09/18 1702)  Labs: Recent Labs    11/18/18 1932  11/19/18 0411  11/19/18 1836  11/20/18 0801 11/20/18 1800 11/21/18 0314 11/21/18 1504 11/21/18 1817  HGB  --    < > 7.4*   < > 7.5*   < > 8.2*  --  9.0* 9.2*  --   HCT  --    < > 20.9*   < > 22.1*   < > 24.1*  --  26.2* 28.4*  --   PLT  --    < > 171  --   --   --  189  --  210  --   --   LABPROT  --   --  17.2*  --   --   --  14.4  --  14.5  --   --   INR  --   --  1.4*  --   --   --  1.1  --  1.1  --   --   HEPARINUNFRC  --   --   --    < > 0.39  --  0.11* 0.19*  --   --  0.27*  CREATININE  --    < > 0.68  --  0.61  --  0.61  --  0.62  --   --   TROPONINIHS 19*  --   --   --   --   --   --   --   --   --   --    < > = values in this interval not displayed.    Estimated Creatinine Clearance: 80.8 mL/min (by C-G formula based on SCr of 0.62 mg/dL).  Assessment: CC/HPI: dark colored diarrhea, dizziness x 4 days; hypotension/syncope while using the bathroom - initial Hgb 3.4, INR > 10. Reversed warfarin with vit K FFP  PMH: rheumatic heart disease, mitral and aortic valve replacement, chronic on warfarin; Afib  Pt s/p colonoscopy this am and heparin resumed. Initial heparin level is slightly below goal at 0.27.   Goal of Therapy:  Heparin level 0.3 - 0.5 units/ml Monitor platelets by anticoagulation protocol: Yes   Plan:  -Increase heparin to 1150 units/hr -Recheck heparin level with morning labs    Arrie Senate, PharmD, BCPS Clinical Pharmacist [phone] Please check AMION for all [place]  numbers 11/21/2018

## 2018-11-21 NOTE — Op Note (Signed)
[facility] Patient Name: Sharon MinerLinda Figueroa Procedure Date : 11/21/2018 MRN: [id number] Attending MD: Kerin SalenArya Nuno Brubacher , MD Date of Birth: 04/09/1960 CSN: [id number] Age: 58 Admit Type: Inpatient Procedure:                Colonoscopy Indications:              This is the patient's first colonoscopy, Melena,                            small ulcer noted on EGD, r/o LGIB Providers:                Kerin SalenArya Clovis Mankins, MD, April HoldingJamie Bailey, RN, Kandice RobinsonsGuillaume Awaka,                            Technician Referring MD:              Medicines:                Monitored Anesthesia Care Complications:            No immediate complications. Estimated blood loss:                            Minimal. Estimated Blood Loss:     Estimated blood loss was minimal. Procedure:                Pre-Anesthesia Assessment:                           - Prior to the procedure, a History and Physical                            was performed, and patient medications and                            allergies were reviewed. The patient's tolerance of                            previous anesthesia was also reviewed. The risks                            and benefits of the procedure and the sedation                            options and risks were discussed with the patient.                            All questions were answered, and informed consent                            was obtained. Prior Anticoagulants: The patient has                            taken Coumadin (warfarin), last dose was 3 days  prior to procedure. She was on IV heparin until 3                            hours prior to presentation. ASA Grade Assessment:                            III - A patient with severe systemic disease. After                            reviewing the risks and benefits, the patient was                            deemed in satisfactory condition to undergo the                            procedure.             After obtaining informed consent, the colonoscope                            was passed under direct vision. Throughout the                            procedure, the patient's blood pressure, pulse, and                            oxygen saturations were monitored continuously. The                            PCF-H190DL ([id number] Olympus pediatric colonscope                            was introduced through the anus and advanced to the                            the terminal ileum. The colonoscopy was performed                            without difficulty. The patient tolerated the                            procedure well. The quality of the bowel                            preparation was good. Scope In: 9:52:40 AM Scope Out: 10:13:42 AM Scope Withdrawal Time: 0 hours 3 minutes 16 seconds  Total Procedure Duration: 0 hours 21 minutes 2 seconds  Findings:      The perianal and digital rectal examinations were normal.      Two sessile polyps were found in the rectum and sigmoid colon. The       polyps were 4 to 5 mm in size. These polyps were removed with a cold       biopsy forceps. Resection and retrieval were complete.      A 6 mm polyp was found in  the cecum. The polyp was sessile. The polyp       was removed with a hot snare. Resection and retrieval were complete.      A 5 mm polyp was found in the ascending colon. The polyp was sessile.       The polyp was removed with a hot snare. Resection and retrieval were       complete.      A 3 mm polyp was found in the transverse colon. The polyp was sessile.       The polyp was removed with a cold biopsy forceps. Resection and       retrieval were complete.      The terminal ileum appeared normal.      The exam was otherwise without abnormality on direct and retroflexion       views. Impression:               - Two 4 to 5 mm polyps in the rectum and in the                            sigmoid colon, removed with a cold biopsy  forceps.                            Resected and retrieved.                           - One 6 mm polyp in the cecum, removed with a hot                            snare. Resected and retrieved.                           - One 5 mm polyp in the ascending colon, removed                            with a hot snare. Resected and retrieved.                           - One 3 mm polyp in the transverse colon, removed                            with a cold biopsy forceps. Resected and retrieved.                           - The examined portion of the ileum was normal.                           - The examination was otherwise normal on direct                            and retroflexion views. Moderate Sedation:      Patient did not receive moderate sedation for this procedure, but       instead received monitored anesthesia care. Recommendation:           - Resume regular diet.                           -  Continue present medications.                           - Await pathology results.                           - Resume Coumadin (warfarin) at prior dose tomorrow. Procedure Code(s):        --- Professional ---                           [phone], Colonoscopy, flexible; with removal of                            tumor(s), polyp(s), or other lesion(s) by snare                            technique                           45380, 59, Colonoscopy, flexible; with biopsy,                            single or multiple Diagnosis Code(s):        --- Professional ---                           K62.1, Rectal polyp                           K63.5, Polyp of colon                           K92.1, Melena (includes Hematochezia) CPT copyright 2019 [employer]. All rights reserved. The codes documented in this report are preliminary and upon coder review may  be revised to meet current compliance requirements. Kerin Salen, MD 11/21/2018 10:26:38 AM This report has been signed electronically. Number  of Addenda: 0

## 2018-11-21 NOTE — Brief Op Note (Signed)
11/18/2018 - 11/21/2018  10:27 AM  PATIENT:  Sharon Figueroa  58 y.o. female  PRE-OPERATIVE DIAGNOSIS:  Melena, small antral ulcer, needs colonoscopy  POST-OPERATIVE DIAGNOSIS:  rectal polyp biopsy, cecal polyp hot snare, ascending colon polyp hot snare, transverse colon polyp biopsy, sigmoid colon polyp biopsy   PROCEDURE:  Procedure(s): COLONOSCOPY WITH PROPOFOL (N/A) BIOPSY POLYPECTOMY  SURGEON:  Surgeon(s) and Role:    [name], MD - Primary  PHYSICIAN ASSISTANT:   ASSISTANTS: Ferdinand Lango  ANESTHESIA:   MAC  EBL:  Minimal  BLOOD ADMINISTERED:none  DRAINS: none   LOCAL MEDICATIONS USED:  NONE  SPECIMEN:  Biopsy / Limited Resection  DISPOSITION OF SPECIMEN:  PATHOLOGY  COUNTS:  YES  TOURNIQUET:  * No tourniquets in log *  DICTATION: .Dragon Dictation  PLAN OF CARE: Admit to inpatient   PATIENT DISPOSITION:  PACU - hemodynamically stable.   Delay start of Pharmacological VTE agent (>24hrs) due to surgical blood loss or risk of bleeding: no

## 2018-11-21 NOTE — Op Note (Signed)
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   Findings: 2 polyps noted in rectum and sigmoid, removed via cold biopsy polypectomy. 1 polyp noted in cecum, removed via hot snare polypectomy. 1 Polyp noted in ascending colon, removed via hot snare polypectomy. 1 polyp noted in transverse colon, removed via cold biopsy polypectomy. Normal terminal ileum. Normal retroflexion.   Recommendations: Regular diet. Resume IV heparin. Okay to start Coumadin from tomorrow. Pathology may be followed as an outpatient. We will sign off, please recall as needed.  [name], MD

## 2018-11-21 NOTE — Transfer of Care (Signed)
Immediate Anesthesia Transfer of Care Note  Patient: Laelle Bridgett  Procedure(s) Performed: COLONOSCOPY WITH PROPOFOL (N/A ) BIOPSY POLYPECTOMY  Patient Location: Endoscopy Unit  Anesthesia Type:MAC  Level of Consciousness: drowsy and patient cooperative  Airway & Oxygen Therapy: Patient Spontanous Breathing  Post-op Assessment: Report given to RN and Post -op Vital signs reviewed and stable  Post vital signs: Reviewed and stable  Last Vitals:  Vitals Value Taken Time  BP 120/56 11/21/18 1024  Temp 36.6 C 11/21/18 1024  Pulse 98 11/21/18 1024  Resp 22 11/21/18 1024  SpO2 100 % 11/21/18 1024  Vitals shown include unvalidated device data.  Last Pain:  Vitals:   11/21/18 1024  TempSrc: Oral  PainSc: 0-No pain         Complications: No apparent anesthesia complications

## 2018-11-21 NOTE — Interval H&P Note (Signed)
History and Physical Interval Note: 58/female with dark stools, was on coumadin for metallic heart valves, EGD showed ulcer with pigmentation, treated with bipolar cautery, for Colonoscopy today to r/o lower GI bleeding and need to restart coumadin, 11/21/2018 9:26 AM  Sharon Figueroa  has presented today for colonoscopy, with the diagnosis of Melena, small antral ulcer, needs colonoscopy.  The various methods of treatment have been discussed with the patient and family. After consideration of risks, benefits and other options for treatment, the patient has consented to  Procedure(s): COLONOSCOPY WITH PROPOFOL (N/A) as a surgical intervention.  The patient's history has been reviewed, patient examined, no change in status, stable for surgery.  I have reviewed the patient's chart and labs.  Questions were answered to the patient's satisfaction.     [name]

## 2018-11-21 NOTE — Anesthesia Postprocedure Evaluation (Signed)
Anesthesia Post Note  Patient: Sharon Figueroa  Procedure(s) Performed: COLONOSCOPY WITH PROPOFOL (N/A ) BIOPSY POLYPECTOMY     Patient location during evaluation: Endoscopy Anesthesia Type: MAC Level of consciousness: awake and alert Pain management: pain level controlled Vital Signs Assessment: post-procedure vital signs reviewed and stable Respiratory status: spontaneous breathing, nonlabored ventilation, respiratory function stable and patient connected to nasal cannula oxygen Cardiovascular status: blood pressure returned to baseline and stable Postop Assessment: no apparent nausea or vomiting Anesthetic complications: no    Last Vitals:  Vitals:   11/21/18 1200 11/21/18 1300  BP: (!) 163/72 (!) 105/49  Pulse: (!) 51 86  Resp: 20 14  Temp:    SpO2: 98% 100%    Last Pain:  Vitals:   11/21/18 1155  TempSrc: Oral  PainSc:                  Barnet Glasgow

## 2018-11-21 NOTE — Anesthesia Preprocedure Evaluation (Addendum)
Anesthesia Evaluation  Patient identified by MRN, date of birth, ID band Patient awake    Reviewed: Allergy & Precautions, NPO status , Patient's Chart, lab work & pertinent test results  Airway Mallampati: II  TM Distance: >3 FB Neck ROM: Full    Dental no notable dental hx. (+) Teeth Intact   Pulmonary former smoker,    Pulmonary exam normal breath sounds clear to auscultation       Cardiovascular Exercise Tolerance: Good Normal cardiovascular exam+ dysrhythmias Atrial Fibrillation + Valvular Problems/Murmurs  Rhythm:Regular Rate:Normal  Echo EF 50%   Neuro/Psych negative neurological ROS  negative psych ROS   GI/Hepatic negative GI ROS, Neg liver ROS,   Endo/Other  negative endocrine ROS  Renal/GU negative Renal ROS     Musculoskeletal   Abdominal   Peds  Hematology  (+) anemia , Hgb 9.0   Anesthesia Other Findings   Reproductive/Obstetrics                                                             Anesthesia Evaluation  Patient identified by MRN, date of birth, ID band Patient awake    Reviewed: Allergy & Precautions, NPO status , Patient's Chart, lab work & pertinent test results  Airway Mallampati: II  TM Distance: >3 FB Neck ROM: Full    Dental no notable dental hx. (+) Teeth Intact, Dental Advisory Given   Pulmonary Current Smoker and Patient abstained from smoking., former smoker,    Pulmonary exam normal breath sounds clear to auscultation       Cardiovascular Pt. on home beta blockers + dysrhythmias Atrial Fibrillation Valvular problems/murmurs: MS.  Rhythm:Regular Rate:Normal  S/P MVR (mitral valve replacement) S/P AVR  ECHO 9/20  FINDINGS Left Ventricle: The left ventricle has mildly reduced systolic function, with an ejection fraction of 45-50%. The cavity size was normal. mild posterior wall hypertrophy. Left ventricular diastolic Doppler  parameters are indeterminate.   Right Ventricle: The right ventricle has normal systolic function. The cavity was normal. There is no increase in right ventricular wall thickness.   Left Atrium: Left atrial size was severely dilated.   Neuro/Psych negative neurological ROS  negative psych ROS   GI/Hepatic negative GI ROS, Neg liver ROS,   Endo/Other  negative endocrine ROS  Renal/GU negative Renal ROS     Musculoskeletal negative musculoskeletal ROS (+)   Abdominal   Peds  Hematology  (+) Blood dyscrasia, anemia ,   Anesthesia Other Findings MS   Reproductive/Obstetrics                            Anesthesia Physical  Anesthesia Plan  ASA: III  Anesthesia Plan: MAC   Post-op Pain Management:    Induction: Intravenous  PONV Risk Score and Plan: 2 and Treatment may vary due to age or medical condition  Airway Management Planned: Nasal Cannula and Simple Face Mask  Additional Equipment:   Intra-op Plan:   Post-operative Plan:   Informed Consent: I have reviewed the patients History and Physical, chart, labs and discussed the procedure including the risks, benefits and alternatives for the proposed anesthesia with the patient or authorized representative who has indicated his/her understanding and acceptance.     Dental advisory given  Plan Discussed with:  CRNA and Anesthesiologist  Anesthesia Plan Comments:         Anesthesia Quick Evaluation  Anesthesia Physical Anesthesia Plan  ASA: III  Anesthesia Plan: MAC   Post-op Pain Management:    Induction: Intravenous  PONV Risk Score and Plan: Treatment may vary due to age or medical condition  Airway Management Planned: Nasal Cannula and Natural Airway  Additional Equipment:   Intra-op Plan:   Post-operative Plan:   Informed Consent: I have reviewed the patients History and Physical, chart, labs and discussed the procedure including the risks, benefits and  alternatives for the proposed anesthesia with the patient or authorized representative who has indicated his/her understanding and acceptance.     Dental advisory given  Plan Discussed with:   Anesthesia Plan Comments:         Anesthesia Quick Evaluation                                  Anesthesia Evaluation  Patient identified by MRN, date of birth, ID band Patient awake    Reviewed: Allergy & Precautions, NPO status , Patient's Chart, lab work & pertinent test results  Airway Mallampati: II  TM Distance: >3 FB Neck ROM: Full    Dental no notable dental hx. (+) Teeth Intact, Dental Advisory Given   Pulmonary Current Smoker and Patient abstained from smoking., former smoker,    Pulmonary exam normal breath sounds clear to auscultation       Cardiovascular Pt. on home beta blockers + dysrhythmias Atrial Fibrillation Valvular problems/murmurs: MS.  Rhythm:Regular Rate:Normal  S/P MVR (mitral valve replacement) S/P AVR  ECHO 9/20  FINDINGS Left Ventricle: The left ventricle has mildly reduced systolic function, with an ejection fraction of 45-50%. The cavity size was normal. mild posterior wall hypertrophy. Left ventricular diastolic Doppler parameters are indeterminate.   Right Ventricle: The right ventricle has normal systolic function. The cavity was normal. There is no increase in right ventricular wall thickness.   Left Atrium: Left atrial size was severely dilated.   Neuro/Psych negative neurological ROS  negative psych ROS   GI/Hepatic negative GI ROS, Neg liver ROS,   Endo/Other  negative endocrine ROS  Renal/GU negative Renal ROS     Musculoskeletal negative musculoskeletal ROS (+)   Abdominal   Peds  Hematology  (+) Blood dyscrasia, anemia ,   Anesthesia Other Findings MS   Reproductive/Obstetrics                            Anesthesia Physical  Anesthesia Plan  ASA: III  Anesthesia Plan: MAC    Post-op Pain Management:    Induction: Intravenous  PONV Risk Score and Plan: 2 and Treatment may vary due to age or medical condition  Airway Management Planned: Nasal Cannula and Simple Face Mask  Additional Equipment:   Intra-op Plan:   Post-operative Plan:   Informed Consent: I have reviewed the patients History and Physical, chart, labs and discussed the procedure including the risks, benefits and alternatives for the proposed anesthesia with the patient or authorized representative who has indicated his/her understanding and acceptance.     Dental advisory given  Plan Discussed with: CRNA and Anesthesiologist  Anesthesia Plan Comments:         Anesthesia Quick Evaluation

## 2018-11-21 NOTE — Anesthesia Procedure Notes (Addendum)
Procedure Name: MAC Date/Time: 11/21/2018 9:45 AM Performed by: Kathryne Hitch, CRNA Pre-anesthesia Checklist: Patient identified, Emergency Drugs available, Suction available and Patient being monitored Patient Re-evaluated:Patient Re-evaluated prior to induction Oxygen Delivery Method: Simple face mask Preoxygenation: Pre-oxygenation with 100% oxygen Induction Type: IV induction Dental Injury: Teeth and Oropharynx as per pre-operative assessment

## 2018-11-21 NOTE — Progress Notes (Signed)
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  INR now 1.4, Hgb stabilized. EGD on 9/17 found 4 mm non-bleeding gastric ulcer which was successfully coagulated.  History of present illness   Patient is normally on warfarin with goal of 3-3.5 for her mechanical valve replacements (history of rheumatic fever as a child).  She started having profuse diarrhea with black stools 3 days ago on Saturday.  No vomiting, and no pain, but did have decreased appetite and did not eat much.  She kept taking her medications- warfarin, ASA 81, and metoprolol.  She denies any NSAIDs, denies history of ulcers or GI bleed in the past.  No infectious symptoms- no fevers, chills, cough, shortness of breath.  She did pass out 3 times in the last couple days, but denies ever hitting her head.  She is very lightheaded with sitting up and standing up, but no lightheadedness with lying flat.    In the ED, prior to me seeing her, she was hypotensive in the 70s/50s, mentating fine laying down.  She had received 1 unit PRBCs in addition to 2L IV fluids.  BP was starting to improve- up to 90s/50s after receiving the unit.  GI was called by the ED, and are planning to see patient in the AM.  Laboratory eval significant for INR > 10.  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 9/18 Colonoscopy>>  Consults:  GI  Procedures:  9/17 EGD>>- 4 mm non-bleeding gastric ulcer with pigmented material, treated  with bipolar cautery and erythematous mucosa in the antrum. Colonoscopy 9/18 >> polyps present, removed.  No other source or stigmata of bleeding noted.  Significant Diagnostic Tests:    Micro Data:  COVID negative H pylori test pending  Antimicrobials:  None  Interval/Subjective:  Hemoglobin has been stable.  No acute issues overnight Tolerating a diet Note hypokalemia and hypophosphatemia on labs this morning Planning for colonoscopy today  Objective   Blood pressure (!) 121/56, pulse 92, temperature 97.9 F (36.6 C), temperature source Oral, resp. rate (!) 25, height 5' 6.5" (1.689 m), weight 80.2 kg, SpO2 98 %.        Intake/Output Summary (Last 24 hours) at 11/21/2018 1120 Last data filed at 11/21/2018 1019 Gross per 24 hour  Intake 2048.16 ml  Output 2555 ml  Net -506.84 ml   Filed Weights   11/18/18 2300 11/19/18 0500 11/20/18 0500  Weight: 82.3 kg 81.5 kg 80.2 kg    Examination:  General: Comfortable woman lying in bed, better color today HENT: Oropharynx clear, no icterus Lungs: Clear bilaterally, no crackles, no wheezes Cardiovascular: Regular 2/6 systolic murmur, click present Abdomen: Soft, nontender, nondistended with positive bowel sounds Extremities: no significant edema Neuro: Awake, alert, interacting appropriately, answering all questions, moves all extremities, nonfocal  Resolved Hospital Problem list   Hemorrhagic shock Leukocytosis  Assessment & Plan:  58 yo F with acute blood loss anemia due to GI bleed, and supratherapeutic INR.    Acute blood loss anemia:  s/p 3 units PRBC.  GI bleed, likely upper GI bleed. EGD on 9/17 identified 4 mm non-bleeding gastric ulcer, successfully cauterized.  No additional source seen on colonoscopy 9/18 Supratherapeutic INR: > 10 on admit. s/p Vit K and 2 units FFP 9/15 -Continue to follow CBC, changed to every 12 hours -Okay for enteral diet -Continue PPI twice daily for now, transition to once daily when  okay with GI -Send H. pylori serologies today   Aortic and mitral mechanical valves (hx rheumatic disease) Chronic warfarin use with goal INR 3-3.5 -Restart heparin infusion now (post colonoscopy) -Okay to restart Coumadin as per pharmacy on 9/19 -We will restart aspirin when cleared by GI to do so -Restart her home beta-blockade when blood pressure will allow   Best practice:  Diet: Clear liquid diet, advance to heart healthy Pain/Anxiety/Delirium protocol (if indicated): N/A VAP protocol (if indicated): n/a DVT prophylaxis: SCDs (active bleeding) GI prophylaxis: ppi twice daily Glucose control: Stop glucose checks 9/18, have been at goal Mobility: n/a Code Status: Full Family Communication: patient updated Disposition: Transition to floor bed on 9/18  Labs   CBC: Recent Labs  Lab 11/18/18 1731 11/18/18 2245  11/19/18 0411 11/19/18 1239 11/19/18 1836 11/19/18 2356 11/20/18 0801 11/21/18 0314  WBC 29.9* 19.4*  --  18.3*  --   --   --  12.4* 10.1  NEUTROABS 23.0*  --   --   --   --   --   --   --   --   HGB 3.4* 5.5*   < > 7.4* 7.6* 7.5* 6.9* 8.2* 9.0*  HCT 10.2* 16.0*   < > 20.9* 21.1* 22.1* 19.6* 24.1* 26.2*  MCV 99.0 95.2  --  91.3  --   --   --  93.1 93.6  PLT 239 164  --  171  --   --   --  189 210   < > = values in this interval not displayed.    Basic Metabolic Panel: Recent Labs  Lab 11/18/18 1731 11/19/18 0411 11/19/18 1836 11/20/18 0801 11/21/18 0314  NA 134* 137 138 137 139  K 3.7 3.4* 3.7 3.5 3.2*  CL 105 108 106 104 107  CO2 20* 21* 25 25 25   GLUCOSE 137* 122* 104* 101* 100*  BUN 43* 24* 15 10 5*  CREATININE 1.10* 0.68 0.61 0.61 0.62  CALCIUM 7.6* 7.8* 7.9* 7.5* 8.0*  MG 1.6* 2.4 2.2 2.2 2.2  PHOS  --  2.2* 1.6* 2.5 2.1*   GFR: Estimated Creatinine Clearance: 80.8 mL/min (by C-G formula based on SCr of 0.62 mg/dL). Recent Labs  Lab 11/18/18 1732 11/18/18 1932 11/18/18 2245 11/19/18 0209 11/19/18 0411 11/20/18 0801 11/21/18 0314   WBC  --   --  19.4*  --  18.3* 12.4* 10.1  LATICACIDVEN 4.4* 3.1* 1.8 1.8  --   --   --     Liver Function Tests: Recent Labs  Lab 11/18/18 1731  AST 17  ALT 12  ALKPHOS 37*  BILITOT 0.5  PROT 4.5*  ALBUMIN 2.7*   Recent Labs  Lab 11/18/18 1731  LIPASE 31   No results for input(s): AMMONIA in the last 168 hours.  ABG    Component Value Date/Time   PHART 7.375 04/04/2017 2009   PCO2ART 31.8 (L) 04/04/2017 2009   PO2ART 55.0 (L) 04/04/2017 2009   HCO3 18.7 (L) 04/04/2017 2009   TCO2 27 04/06/2017 1551   ACIDBASEDEF 6.0 (H) 04/04/2017 2009   O2SAT 89.0  04/04/2017 2009     Coagulation Profile: Recent Labs  Lab 11/18/18 1731 11/19/18 0411 11/20/18 0801 11/21/18 0314  INR >10.0* 1.4* 1.1 1.1    Cardiac Enzymes: No results for input(s): CKTOTAL, CKMB, CKMBINDEX, TROPONINI in the last 168 hours.  HbA1C: Hgb A1c MFr Bld  Date/Time Value Ref Range Status  03/30/2017 05:21 PM 5.3 4.8 - 5.6 % Final    Comment:    (NOTE) Pre diabetes:          5.7%-6.4% Diabetes:              >6.4% Glycemic control for   <7.0% adults with diabetes     CBG: Recent Labs  Lab 11/20/18 1645 11/20/18 1945 11/20/18 2341 11/21/18 0328 11/21/18 0743  GLUCAP 74 85 97 89 88    Transfer to floor bed on 9/18.  Will ask TR[facility] to assume her care as of 9/19    Levy Pupaobert Drewey Begue, MD, PhD 11/21/2018, 11:20 AM [facility] [phone] or if no answer [phone]

## 2018-11-21 NOTE — Progress Notes (Signed)
NAME:  Sharon Figueroa, MRN:  [id number], DOB:  04/18/1960, LOS: 3 ADMISSION DATE:  11/18/2018, CONSULTATION DATE:  11/18/2018 REFERRING MD:  Emergency Department, CHIEF COMPLAINT:  GI bleed   Brief History   58 yo F with a history of aortic and mitral mechanical valve replacements on warfarin who presented with dark stools, and hypotension and hemoglobin of 3.4 and INR >10.  INR now 1.4, Hgb stabilized. EGD on 9/17 found 4 mm non-bleeding gastric ulcer which was successfully coagulated. Colonoscopy on 9/18 removed several polyps but found no sources of bleeding.  History of present illness   Patient is normally on warfarin with goal of 3-3.5 for her mechanical valve replacements (history of rheumatic fever as a child).  She started having profuse diarrhea with black stools 3 days ago on Saturday.  No vomiting, and no pain, but did have decreased appetite and did not eat much.  She kept taking her medications- warfarin, ASA 81, and metoprolol.  She denies any NSAIDs, denies history of ulcers or GI bleed in the past.  No infectious symptoms- no fevers, chills, cough, shortness of breath.  She did pass out 3 times in the last couple days, but denies ever hitting her head.  She is very lightheaded with sitting up and standing up, but no lightheadedness with lying flat.    In the ED, prior to me seeing her, she was hypotensive in the 70s/50s, mentating fine laying down.  She had received 1 unit PRBCs in addition to 2L IV fluids.  BP was starting to improve- up to 90s/50s after receiving the unit.  GI was called by the ED, and are planning to see patient in the AM.  Laboratory eval significant for INR > 10.  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 9/18 Colonoscopy>> Multiple polyps removed however no  findings associated with acute bleeding.  Consults:  GI PCCM  Procedures:  9/17 EGD>>- 4 mm non-bleeding gastric ulcer with pigmented material, treated with bipolar cautery and erythematous mucosa in the antrum. 9/18 Colonoscopy>> Two 4 to 5 mm polyps in the rectum and in the sigmoid colon, one 6 mm polyp in the cecum, one 5 mm polyp in the ascending colon, and one 3 mm polyp in the transverse colon resected and retrieved with a cold biopsy forceps and hot snare. Findings otherwise normal.  Significant Diagnostic Tests:    Micro Data:  COVID negative H pylori test pending  Antimicrobials:  None  Interval/Subjective:  No acute events overnight. Patient completed 2/3 of bowel prep prior to colonoscopy this AM. Continues to have improvement in fatigue, endorses increased appetite. Believes fingers are mildly swollen. Denies n/v, hemoptysis, hematochezia, abdominal pain, or fever/chills.  Objective   Blood pressure (!) 111/48, pulse 81, temperature 98.9 F (37.2 C), temperature source Oral, resp. rate 17, height 5' 6.5" (1.689 m), weight 80.2 kg, SpO2 100 %.        Intake/Output Summary (Last 24 hours) at 11/21/2018 0949 Last data filed at 11/21/2018 0800 Gross per 24 hour  Intake 1248.16 ml  Output 2550 ml  Net -1301.84 ml   Filed Weights   11/18/18 2300 11/19/18 0500 11/20/18 0500  Weight: 82.3 kg 81.5 kg 80.2 kg    Examination:  General: Pleasant, cooperative middle-aged female in no acute distress resting in bed HENT: Normocephalic, atraumatic.  Anicteric sclerae. Pupils equal, round, and reactive to light. Lungs: Clear breath sounds bilaterally with symmetric chest wall expansion and normal WOB on RA Cardiovascular: Regular rate and rhythm. Systolic click. No murmurs or rubs. 2+ radial pulse. Abdomen: soft, non-distended. Non-tender Extremities: no lower extremity edema. Neuro: alert + oriented x 4, no gross deficits  Resolved Hospital Problem list   Hemorrhagic shock   Assessment & Plan:  58 yo F with acute blood loss anemia due to GI bleed, and supratherapeutic INR.    Acute blood loss anemia: s/p 3 units PRBC. Of note Hgb in 04/2017 was 8.5. GI bleed, likely upper GI bleed. EGD on 9/17 identified 4 mm non-bleeding gastric ulcer, successfully cauterized Supratherapeutic INR: > 10 on admit. s/p Vit K and 2 units FFP 9/15 - GI following - Continue ppi BID until GI ok's QD - Resume normal diet  - Trend H&H q12H - trend INR (1.1 today) - Continue heparin for AC given mechanical mitral/aortic valves. Dosing per pharmacy  - Restart home coumadin tomorrow, dosing per pharmacy - H pylori testing ordered  Hypotension: maps in low 60s intermittently. Patient reports normal SBP in 90s at home. BP now 121/56. - Discontinued maintenance  IVF - Blood products depending of H&H results.   Aortic and mitral mechanical valves (hx rheumatic disease) Chronic warfarin use with goal INR 3-3.5 - Restarted heparin following colonoscopy - Restart home coumadin tomorrow morning  Leukocytosis: Resolved.  Hypokalemia and Hypophosphatemia: K 3.2 and Phosphorous 2.1 today. -Replenish via PO tablets  Best practice:  Diet: Clear liquid diet Pain/Anxiety/Delirium protocol (if indicated): N/A VAP protocol (if indicated): n/a DVT prophylaxis: SCDs (active bleeding) GI prophylaxis: ppi Glucose control: q4h glucose checks Mobility: n/a Code Status: Full Family Communication: patient updated Disposition: Will keep in ICU pending heparin infusion in setting of GI bleed.   Labs   CBC: Recent Labs  Lab 11/18/18 1731 11/18/18 2245  11/19/18 0411 11/19/18 1239 11/19/18 1836 11/19/18 2356 11/20/18 0801 11/21/18 0314  WBC 29.9* 19.4*  --  18.3*  --   --   --  12.4* 10.1  NEUTROABS 23.0*  --   --   --   --   --   --   --   --   HGB 3.4* 5.5*   < > 7.4* 7.6* 7.5* 6.9* 8.2* 9.0*  HCT 10.2* 16.0*   < > 20.9* 21.1* 22.1* 19.6* 24.1* 26.2*  MCV 99.0 95.2  --  91.3  --    --   --  93.1 93.6  PLT 239 164  --  171  --   --   --  189 210   < > = values in this interval not displayed.    Basic Metabolic Panel: Recent Labs  Lab 11/18/18 1731 11/19/18 0411 11/19/18 1836 11/20/18 0801 11/21/18 0314  NA 134* 137 138 137 139  K 3.7 3.4* 3.7 3.5 3.2*  CL 105 108 106 104 107  CO2 20* 21* 25 25 25   GLUCOSE 137* 122* 104* 101* 100*  BUN 43* 24* 15 10 5*  CREATININE 1.10* 0.68 0.61 0.61 0.62  CALCIUM 7.6* 7.8* 7.9* 7.5* 8.0*  MG 1.6* 2.4 2.2 2.2 2.2  PHOS  --  2.2* 1.6* 2.5 2.1*   GFR: Estimated Creatinine Clearance: 80.8 mL/min (by C-G formula based on SCr of 0.62 mg/dL). Recent Labs  Lab 11/18/18 1732 11/18/18 1932 11/18/18 2245 11/19/18 0209 11/19/18 0411 11/20/18 0801 11/21/18 0314  WBC  --   --  19.4*  --  18.3* 12.4* 10.1  LATICACIDVEN 4.4* 3.1* 1.8 1.8  --   --   --     Liver Function Tests: Recent Labs  Lab 11/18/18 1731  AST 17  ALT 12  ALKPHOS 37*  BILITOT 0.5  PROT 4.5*  ALBUMIN 2.7*   Recent Labs  Lab 11/18/18 1731  LIPASE 31   No results for input(s): AMMONIA in the last 168 hours.  ABG    Component Value Date/Time   PHART 7.375 04/04/2017 2009   PCO2ART 31.8 (L) 04/04/2017 2009   PO2ART 55.0 (L) 04/04/2017 2009   HCO3 18.7 (L) 04/04/2017 2009   TCO2 27 04/06/2017 1551   ACIDBASEDEF 6.0 (H) 04/04/2017 2009   O2SAT 89.0 04/04/2017 2009     Coagulation Profile: Recent Labs  Lab 11/18/18 1731 11/19/18 0411 11/20/18 0801 11/21/18 0314  INR >10.0* 1.4* 1.1 1.1    Cardiac Enzymes: No results for input(s): CKTOTAL, CKMB, CKMBINDEX, TROPONINI in the last 168 hours.  HbA1C: Hgb A1c MFr Bld  Date/Time Value Ref Range Status  03/30/2017 05:21 PM 5.3 4.8 - 5.6 % Final    Comment:    (NOTE) Pre diabetes:          5.7%-6.4% Diabetes:              >6.4% Glycemic control for   <7.0% adults with diabetes     CBG: Recent Labs  Lab 11/20/18 1645 11/20/18 1945 11/20/18 2341 11/21/18 0328 11/21/18 0743   GLUCAP 74 85 97 89 88    Review of Systems:   ROS negative except for above in HPI  Past Medical History  She,  has a past medical history of Mitral valve disorder.   Surgical History    Past Surgical History:  Procedure Laterality Date  . AORTIC VALVE REPLACEMENT N/A 04/04/2017   Procedure: AORTIC VALVE REPLACEMENT (AVR);  Surgeon: Alleen Borne, MD;  Location: [facility] OR;  Service: Open Heart Surgery;  Laterality: N/A;  . CARDIAC SURGERY    . ESOPHAGOGASTRODUODENOSCOPY N/A 11/20/2018   Procedure: ESOPHAGOGASTRODUODENOSCOPY (EGD);  Surgeon: Kerin Salen, MD;  Location: [facility] ENDOSCOPY;  Service: Gastroenterology;  Laterality: N/A;  . HOT HEMOSTASIS N/A 11/20/2018   Procedure: HOT HEMOSTASIS (ARGON PLASMA COAGULATION/BICAP);  Surgeon: Kerin Salen, MD;  Location: [facility] ENDOSCOPY;  Service: Gastroenterology;  Laterality: N/A;  . MAZE N/A 04/04/2017   Procedure: MAZE;  Surgeon: Alleen Borne, MD;  Location: [facility] OR;  Service: Open Heart Surgery;  Laterality: N/A;  . MITRAL VALVE REPLACEMENT N/A 04/04/2017   Procedure: MITRAL VALVE (MV) REPLACEMENT;  Surgeon: Alleen Borne, MD;  Location: [facility] OR;  Service: Open Heart Surgery;  Laterality: N/A;  . No prior surgery    . RIGHT/LEFT HEART CATH AND CORONARY ANGIOGRAPHY N/A 04/01/2017   Procedure: RIGHT/LEFT HEART CATH AND CORONARY ANGIOGRAPHY;  Surgeon: Runell Gess, MD;  Location: [facility] INVASIVE CV LAB;  Service: Cardiovascular;  Laterality: N/A;  . TEE WITHOUT CARDIOVERSION N/A 04/04/2017   Procedure: TRANSESOPHAGEAL ECHOCARDIOGRAM (TEE);  Surgeon: Alleen Borne, MD;  Location: [facility] OR;  Service: Open Heart Surgery;  Laterality: N/A;     Social History   reports that she quit smoking about 19 months ago. Her smoking use included cigarettes. She smoked 1.00 pack per day. She has never used smokeless tobacco. She reports that she does not drink alcohol or use drugs.   Family History   Her family history includes CVA in her father; Heart attack in her  father.  Allergies No Known Allergies   Home Medications  Prior to Admission medications   Medication Sig Start Date End Date Taking? Authorizing Provider  acetaminophen (TYLENOL) 325 MG tablet Take 2 tablets (650 mg total) by mouth every 6 (six) hours as needed for mild pain. 04/09/17   Ardelle BallsZimmerman, Donielle M, PA-C  aspirin EC 81 MG tablet Take 1 tablet (81 mg total) by mouth daily. 08/01/17   Lewayne Buntingrenshaw, Brian S, MD  metoprolol succinate (TOPROL-XL) 25 MG 24 hr tablet Take 0.5 tablets (12.5 mg total) by mouth daily. MUST KEEP APPOINTMENT 01/05/19 WITH DR Jens SomRENSHAW FOR FUTURE REFILLS 10/03/18   Lewayne Buntingrenshaw, Brian S, MD  warfarin (COUMADIN) 5 MG tablet Take 1 to 1 and 1/2 tablets by mouth as directed by coumadin clinic 10/03/18   Lewayne Buntingrenshaw, Brian S, MD     Orville GovernJonathan Densil Ottey, MS4  11/21/2018 9:49 AM

## 2018-11-22 ENCOUNTER — Encounter (HOSPITAL_COMMUNITY): Payer: Self-pay | Admitting: Gastroenterology

## 2018-11-22 DIAGNOSIS — R55 Syncope and collapse: Secondary | ICD-10-CM

## 2018-11-22 LAB — HEPARIN LEVEL (UNFRACTIONATED)
Heparin Unfractionated: 0.41 IU/mL (ref 0.30–0.70)
Heparin Unfractionated: 0.61 IU/mL (ref 0.30–0.70)
Heparin Unfractionated: 0.37 IU/mL (ref 0.30–0.70)

## 2018-11-22 LAB — PROTIME-INR
INR: 1 (ref 0.8–1.2)
Prothrombin Time: 13.4 seconds (ref 11.4–15.2)

## 2018-11-22 LAB — BASIC METABOLIC PANEL
Anion gap: 7 (ref 5–15)
BUN: 6 mg/dL (ref 6–20)
CO2: 26 mmol/L (ref 22–32)
Calcium: 8 mg/dL — ABNORMAL LOW (ref 8.9–10.3)
Chloride: 108 mmol/L (ref 98–111)
Creatinine, Ser: 0.72 mg/dL (ref 0.44–1.00)
GFR calc Af Amer: >60 mL/min (ref >60)
GFR calc non Af Amer: >60 mL/min (ref >60)
Glucose, Bld: 99 mg/dL (ref 70–99)
Potassium: 3.7 mmol/L (ref 3.5–5.1)
Sodium: 141 mmol/L (ref 135–145)

## 2018-11-22 LAB — HEMOGLOBIN AND HEMATOCRIT, BLOOD
HCT: 26.2 % — ABNORMAL LOW (ref 36.0–46.0)
HCT: 29.3 % — ABNORMAL LOW (ref 36.0–46.0)
Hemoglobin: 8.8 g/dL — ABNORMAL LOW (ref 12.0–15.0)
Hemoglobin: 9.7 g/dL — ABNORMAL LOW (ref 12.0–15.0)

## 2018-11-22 LAB — PHOSPHORUS: Phosphorus: 4.1 mg/dL (ref 2.5–4.6)

## 2018-11-22 MED ORDER — WARFARIN SODIUM 5 MG PO TABS
5.0000 mg | ORAL_TABLET | Freq: Once | ORAL | Status: AC
  Administered 2018-11-22: 5 mg via ORAL
  Filled 2018-11-22: qty 1

## 2018-11-22 MED ORDER — WARFARIN - PHARMACIST DOSING INPATIENT
Freq: Every day | Status: DC
  Administered 2018-11-22 – 2018-11-25 (×2)

## 2018-11-22 NOTE — Progress Notes (Addendum)
ANTICOAGULATION CONSULT NOTE - Follow-Up Consult  Pharmacy Consult for heparin Indication: atrial fibrillation and valve replacement  No Known Allergies  Patient Measurements: Height: 5' 6.5" (168.9 cm) Weight: 180 lb 12.8 oz (82 kg) IBW/kg (Calculated) : 60.45 Heparin Dosing Weight: 76  Vital Signs: Temp: 98.2 F (36.8 C) (09/19 0551) Temp Source: Oral (09/19 0551) BP: 120/65 (09/19 0551) Pulse Rate: 85 (09/19 0551)  Labs: Recent Labs    11/20/18 0801 11/20/18 1800 11/21/18 0314 11/21/18 1504 11/21/18 1817 11/22/18 0525  HGB 8.2*  --  9.0* 9.2* 9.5* 8.8*  HCT 24.1*  --  26.2* 28.4* 28.4* 26.2*  PLT 189  --  210  --   --   --   LABPROT 14.4  --  14.5  --   --  13.4  INR 1.1  --  1.1  --   --  1.0  HEPARINUNFRC 0.11* 0.19*  --   --  0.27* 0.41  CREATININE 0.61  --  0.62  --   --  0.72    Estimated Creatinine Clearance: 81.6 mL/min (by C-G formula based on SCr of 0.72 mg/dL).  Assessment: CC/HPI: dark colored diarrhea, dizziness x 4 days; hypotension/syncope while using the bathroom - initial Hgb 3.4, INR > 10. Reversed warfarin with vit K FFP  PMH: rheumatic heart disease, mitral and aortic valve replacement, chronic on warfarin; Afib. Home regimen warfarin 7.5 mg MWF, 5 mg all other days.  Heparin level this morning therapeutic at 0.41. Due to jump from 0.27, being previously therapeutic at a lower rate, and risk of bleeding, will decrease heparin rate from 1150 units/hr to 1100 units/hr. Hgb 8.8. No infusion issues or bleeding per the RN.   Starting bridge to warfarin this evening. Warfarin 5 mg x1.   Goal of Therapy:  Heparin level 0.3 - 0.5 units/ml  INR 3-3.5 Monitor platelets by anticoagulation protocol: Yes   Plan:  -Increase heparin to 1100 units/hr -Recheck confirmatory heparin level at 1400 -Restart warfarin 5 mg x1 this evening -Monitor daily CBC, heparin level, INR, and s/sx of bleeding  ADDENDUM:  Heparin level supratherapeutic at 0.61 at rate  of 1100 units/hr after slight decrease this morning. No bleeding or infusion issues per the RN. Will decrease rate to 950 units/hr and recheck heparin level in 6 hours.   Agnes Lawrence, PharmD PGY1 Pharmacy Resident

## 2018-11-22 NOTE — Progress Notes (Signed)
New Admission Note: ? Arrival Method: transfer via bed from 82M Mental Orientation: A/O x 4 Telemetry:  Assessment: Completed Skin: Refer to flowsheet IV: Left FA and Right AC  Pain: none Tubes: Safety Measures: Safety Fall Prevention Plan discussed with patient. Admission: Completed [facility] [place] Orientation: Patient has been orientated to the room, unit and the staff. Family: Orders have been reviewed and are being implemented. Will continue to monitor the patient. Call light has been placed within reach and bed alarm has been activated.  ? [employer], [place]

## 2018-11-22 NOTE — Plan of Care (Signed)
  Problem: Education: Goal: Ability to identify signs and symptoms of gastrointestinal bleeding will improve Outcome: Progressing   

## 2018-11-22 NOTE — Progress Notes (Signed)
ANTICOAGULATION CONSULT NOTE  Pharmacy Consult for heparin Indication: atrial fibrillation and valve replacement  No Known Allergies  Patient Measurements: Height: 5' 6.5" (168.9 cm) Weight: 180 lb 12.8 oz (82 kg) IBW/kg (Calculated) : 60.45 Heparin Dosing Weight: 76  Vital Signs: Temp: 98.6 F (37 C) (09/19 2034) Temp Source: Oral (09/19 2034) BP: 99/65 (09/19 2034) Pulse Rate: 77 (09/19 2034)  Labs: Recent Labs    11/20/18 0801  11/21/18 0314  11/21/18 1817 11/22/18 0525 11/22/18 1338 11/22/18 1724 11/22/18 2128  HGB 8.2*  --  9.0*   < > 9.5* 8.8*  --  9.7*  --   HCT 24.1*  --  26.2*   < > 28.4* 26.2*  --  29.3*  --   PLT 189  --  210  --   --   --   --   --   --   LABPROT 14.4  --  14.5  --   --  13.4  --   --   --   INR 1.1  --  1.1  --   --  1.0  --   --   --   HEPARINUNFRC 0.11*   < >  --   --  0.27* 0.41 0.61  --  0.37  CREATININE 0.61  --  0.62  --   --  0.72  --   --   --    < > = values in this interval not displayed.    Estimated Creatinine Clearance: 81.6 mL/min (by C-G formula based on SCr of 0.72 mg/dL).  Assessment: 80 YOF presented with dark colored diarrhea, dizziness x 4 days, hypotension/syncope while using the bathroom.  Patient had severe anemia with INR > 10 that was reversed.  Pharmacy consulted to dose IV heparin for history of Afib and valve replacement.  Coumadin restarted today.  Heparin level is therapeutic; no bleeding reported.    Goal of Therapy:  Heparin level 0.3 - 0.5 units/ml  INR 3-3.5 Monitor platelets by anticoagulation protocol: Yes   Plan:  Continue heparin gtt at 950 units/hr F/U AM labs  Cherise Fedder D. Mina Marble, PharmD, BCPS, [place] 11/22/2018, 10:28 PM

## 2018-11-22 NOTE — Progress Notes (Signed)
PROGRESS NOTE                                                                                                                                                                                                             Patient Demographics:    Sharon Figueroa, is a 58 y.o. female, DOB - 01/23/1961, RJJ:[id number]  Admit date - 11/18/2018   Admitting Physician Jacalyn Lefevre, MD  Outpatient Primary MD for the patient is Patient, No Pcp Per  LOS - 4  Chief Complaint  Patient presents with   Dizziness   Hypotension   GI Bleeding       Brief Narrative  Patient is normally on warfarin with goal of 3-3.5 for her mechanical valve replacements (history of rheumatic fever as a child).  She started having profuse diarrhea with black stools, was found to have severe acute blood loss related anemia, admitted by ICU seen by GI underwent EGD showing possible acute upper GI bleed.  She was stabilized and transferred to hospitalist care on 11/22/2018.   Subjective:    Sharon Figueroa today has, No headache, No chest pain, No abdominal pain - No Nausea, No new weakness tingling or numbness, No Cough - SOB.     Assessment  & Plan :     1.  Syncope, hypotension due to acute upper GI bleed related blood loss anemia.  Status post EGD colonoscopy, likely bleeding was from the 4 mm nonbleeding gastric ulcer found on EGD.  Colonoscopy nonacute.  She also had supratherapeutic INR upon admission.  So far she has received IV vitamin K, 2 units of FFP along with 3 units of packed RBCs in the ICU.  Now hemodynamically stable and on IV PPI twice daily along with heparin drip.  Continue to monitor H&H and monitor.   2.  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  Goal INR is 2.5-3.5.  Home once goal is reached.  DW Dr C.Bridgette cardiologist on 11/22/2018.  For now discontinue aspirin will  have her follow-up with Dr. Stanford Breed in 2 to 3 weeks and resume aspirin if needed at that time.  Hopefully by that time also would have healed completely.   3.  Paroxysmal atrial fibrillation.  Mali vas 2 score will be 2-3.  Coumadin.    Family Communication  : None  Code Status : Full  Disposition Plan  : Home once  INR reaches 2.5  Consults  : PCCM, GI  Procedures  :    9/17 EGD>>- 4 mm non-bleeding gastric ulcer with pigmented material, treated with bipolar cautery and erythematous mucosa in the antrum. Colonoscopy 9/18 >> polyps present, removed.  No other source or stigmata of bleeding noted.  DVT Prophylaxis  : Heparin Coumadin  Lab Results  Component Value Date   PLT 210 11/21/2018    Diet :  Diet Order            Diet regular Room service appropriate? Yes; Fluid consistency: Thin  Diet effective now               Inpatient Medications Scheduled Meds:  sodium chloride   Intravenous Once   aspirin EC  81 mg Oral Daily   Chlorhexidine Gluconate Cloth  6 each Topical Daily   pantoprazole (PROTONIX) IV  40 mg Intravenous Q12H   warfarin  5 mg Oral ONCE-1800   Warfarin - Pharmacist Dosing Inpatient   Does not apply q1800   Continuous Infusions:  sodium chloride Stopped (11/20/18 0612)   heparin 1,100 Units/hr (11/22/18 0750)   PRN Meds:.sodium chloride, acetaminophen  Antibiotics  :   Anti-infectives (From admission, onward)   None          Objective:   Vitals:   11/22/18 0100 11/22/18 0157 11/22/18 0551 11/22/18 0900  BP: (!) 91/53 (!) 100/59 120/65 (!) 98/57  Pulse: 77 78 85 86  Resp: 17 16 16 16   Temp:  98.4 F (36.9 C) 98.2 F (36.8 C) 98.1 F (36.7 C)  TempSrc:  Oral Oral Oral  SpO2: 93% 96% 99% 99%  Weight:   82 kg   Height:        Wt Readings from Last 3 Encounters:  11/22/18 82 kg  11/01/17 70.3 kg  08/01/17 64 kg     Intake/Output Summary (Last 24 hours) at 11/22/2018 1119 Last data filed at 11/22/2018 0900 Gross  per 24 hour  Intake 1095.18 ml  Output 1850 ml  Net -754.82 ml     Physical Exam  Awake Alert, Oriented X 3, No new F.N deficits, Normal affect [place].AT,PERRAL Supple Neck,No JVD, No cervical lymphadenopathy appriciated.  Symmetrical Chest wall movement, Good air movement bilaterally, CTAB RRR,No Gallops,Rubs , + chronic 4/5 systolic murmur, No Parasternal Heave +ve B.Sounds, Abd Soft, No tenderness, No organomegaly appriciated, No rebound - guarding or rigidity. No Cyanosis, Clubbing or edema, No new Rash or bruise      Data Review:    CBC Recent Labs  Lab 11/18/18 1731 11/18/18 2245  11/19/18 0411  11/20/18 0801 11/21/18 0314 11/21/18 1504 11/21/18 1817 11/22/18 0525  WBC 29.9* 19.4*  --  18.3*  --  12.4* 10.1  --   --   --   HGB 3.4* 5.5*   < > 7.4*   < > 8.2* 9.0* 9.2* 9.5* 8.8*  HCT 10.2* 16.0*   < > 20.9*   < > 24.1* 26.2* 28.4* 28.4* 26.2*  PLT 239 164  --  171  --  189 210  --   --   --   MCV 99.0 95.2  --  91.3  --  93.1 93.6  --   --   --   MCH 33.0 32.7  --  32.3  --  31.7 32.1  --   --   --   MCHC 33.3 34.4  --  35.4  --  34.0 34.4  --   --   --  RDW 15.1 13.2  --  13.9  --  14.6 15.9*  --   --   --   LYMPHSABS 4.2*  --   --   --   --   --   --   --   --   --   MONOABS 2.4*  --   --   --   --   --   --   --   --   --   EOSABS 0.3  --   --   --   --   --   --   --   --   --   BASOSABS 0.0  --   --   --   --   --   --   --   --   --    < > = values in this interval not displayed.    Chemistries  Recent Labs  Lab 11/18/18 1731 11/19/18 0411 11/19/18 1836 11/20/18 0801 11/21/18 0314 11/22/18 0525  NA 134* 137 138 137 139 141  K 3.7 3.4* 3.7 3.5 3.2* 3.7  CL 105 108 106 104 107 108  CO2 20* 21* 25 25 25 26   GLUCOSE 137* 122* 104* 101* 100* 99  BUN 43* 24* 15 10 5* 6  CREATININE 1.10* 0.68 0.61 0.61 0.62 0.72  CALCIUM 7.6* 7.8* 7.9* 7.5* 8.0* 8.0*  MG 1.6* 2.4 2.2 2.2 2.2  --   AST 17  --   --   --   --   --   ALT 12  --   --   --   --   --     ALKPHOS 37*  --   --   --   --   --   BILITOT 0.5  --   --   --   --   --    ------------------------------------------------------------------------------------------------------------------ No results for input(s): CHOL, HDL, LDLCALC, TRIG, CHOLHDL, LDLDIRECT in the last 72 hours.  Lab Results  Component Value Date   HGBA1C 5.3 03/30/2017   ------------------------------------------------------------------------------------------------------------------ Recent Labs    11/19/18 1239  T3FREE 2.0   ------------------------------------------------------------------------------------------------------------------ No results for input(s): VITAMINB12, FOLATE, FERRITIN, TIBC, IRON, RETICCTPCT in the last 72 hours.  Coagulation profile Recent Labs  Lab 11/18/18 1731 11/19/18 0411 11/20/18 0801 11/21/18 0314 11/22/18 0525  INR >10.0* 1.4* 1.1 1.1 1.0    No results for input(s): DDIMER in the last 72 hours.  Cardiac Enzymes No results for input(s): CKMB, TROPONINI, MYOGLOBIN in the last 168 hours.  Invalid input(s): CK ------------------------------------------------------------------------------------------------------------------    Component Value Date/Time   BNP 249.3 (H) 11/18/2018 1732    Micro Results Recent Results (from the past 240 hour(s))  SARS Coronavirus 2 [facility](Hospital order, Performed [facility]) Nasopharyngeal Nasopharyngeal Swab     Status: None   Collection Time: 11/18/18  6:30 PM   Specimen: Nasopharyngeal Swab  Result Value Ref Range Status   SARS Coronavirus 2 NEGATIVE NEGATIVE Final    Comment: (NOTE) If result is NEGATIVE SARS-CoV-2 target nucleic acids are NOT DETECTED. The SARS-CoV-2 RNA is generally detectable in upper and lower  respiratory specimens during the acute phase of infection. The lowest  concentration of SARS-CoV-2 viral copies this assay can detect is 250  copies / mL. A negative result does not preclude SARS-CoV-2  infection  and should not be used as the sole basis for treatment or other  patient management decisions.  A negative result may occur with  improper specimen collection / handling, submission of specimen other  than nasopharyngeal swab, presence of viral mutation(s) within the  areas targeted by this assay, and inadequate number of viral copies  (<250 copies / mL). A negative result must be combined with clinical  observations, patient history, and epidemiological information. If result is POSITIVE SARS-CoV-2 target nucleic acids are DETECTED. The SARS-CoV-2 RNA is generally detectable in upper and lower  respiratory specimens dur ing the acute phase of infection.  Positive  results are indicative of active infection with SARS-CoV-2.  Clinical  correlation with patient history and other diagnostic information is  necessary to determine patient infection status.  Positive results do  not rule out bacterial infection or co-infection with other viruses. If result is PRESUMPTIVE POSTIVE SARS-CoV-2 nucleic acids MAY BE PRESENT.   A presumptive positive result was obtained on the submitted specimen  and confirmed on repeat testing.  While 2019 novel coronavirus  (SARS-CoV-2) nucleic acids may be present in the submitted sample  additional confirmatory testing may be necessary for epidemiological  and / or clinical management purposes  to differentiate between  SARS-CoV-2 and other Sarbecovirus currently known to infect humans.  If clinically indicated additional testing with an alternate test  methodology [id number]) is advised. The SARS-CoV-2 RNA is generally  detectable in upper and lower respiratory sp ecimens during the acute  phase of infection. The expected result is Negative. Fact Sheet for Patients:  BoilerBrush.com.cyhttps://www.fda.gov/media/136312/download Fact Sheet for Healthcare Providers: https://pope.com/https://www.fda.gov/media/136313/download This test is not yet approved or cleared by the [place]  FDA and has been authorized for detection and/or diagnosis of SARS-CoV-2 by FDA under an Emergency Use Authorization (EUA).  This EUA will remain in effect (meaning this test can be used) for the duration of the COVID-19 declaration under Section 564(b)(1) of the Act, 21 U.S.C. section 360bbb-3(b)(1), unless the authorization is terminated or revoked sooner. Performed at [facility], [address] [place], [place] [address]   MRSA PCR Screening     Status: None   Collection Time: 11/18/18 11:07 PM   Specimen: Nasopharyngeal  Result Value Ref Range Status   MRSA by PCR NEGATIVE NEGATIVE Final    Comment:        The GeneXpert MRSA Assay (FDA approved for NASAL specimens only), is one component of a comprehensive MRSA colonization surveillance program. It is not intended to diagnose MRSA infection nor to guide or monitor treatment for MRSA infections. Performed at [facility], [address] [place], [place] [address]     Radiology Reports Dg Chest Portable 1 View  Result Date: 11/18/2018 CLINICAL DATA:  Four-day history fatigue, dizziness and syncope. Hypotension and tachycardia upon emergency department arrival. EXAM: PORTABLE CHEST 1 VIEW COMPARISON:  05/08/2017 and earlier. FINDINGS: Prior sternotomy for aortic and mitral valve replacement. Prior LEFT atrial appendage clipping. Cardiac silhouette upper normal in size, unchanged. Lungs clear. Bronchovascular markings normal. Pulmonary vascularity normal. No visible pleural effusions. No pneumothorax. IMPRESSION: No acute cardiopulmonary disease. Electronically Signed   By: Hulan Saashomas  Lawrence M.D.   On: 11/18/2018 18:05    Time Spent in minutes  30   Susa RaringPrashant Ngozi Alvidrez M.D on 11/22/2018 at 11:19 AM  To page go to [email or web address] - password [email or web address]

## 2018-11-23 LAB — CBC
HCT: 28.1 % — ABNORMAL LOW (ref 36.0–46.0)
Hemoglobin: 9.2 g/dL — ABNORMAL LOW (ref 12.0–15.0)
MCH: 31.6 pg (ref 26.0–34.0)
MCHC: 32.7 g/dL (ref 30.0–36.0)
MCV: 96.6 fL (ref 80.0–100.0)
Platelets: 281 10*3/uL (ref 150–400)
RBC: 2.91 MIL/uL — ABNORMAL LOW (ref 3.87–5.11)
RDW: 15.2 % (ref 11.5–15.5)
WBC: 7 10*3/uL (ref 4.0–10.5)
nRBC: 0 % (ref 0.0–0.2)

## 2018-11-23 LAB — HEPARIN LEVEL (UNFRACTIONATED): Heparin Unfractionated: 0.37 IU/mL (ref 0.30–0.70)

## 2018-11-23 LAB — PROTIME-INR
INR: 1 (ref 0.8–1.2)
Prothrombin Time: 13.1 seconds (ref 11.4–15.2)

## 2018-11-23 MED ORDER — WARFARIN SODIUM 5 MG PO TABS
5.0000 mg | ORAL_TABLET | Freq: Once | ORAL | Status: AC
  Administered 2018-11-23: 5 mg via ORAL
  Filled 2018-11-23: qty 1

## 2018-11-23 NOTE — Progress Notes (Addendum)
Cook for heparin Indication: atrial fibrillation and valve replacement  No Known Allergies  Patient Measurements: Height: 5' 6.5" (168.9 cm) Weight: 180 lb 12.8 oz (82 kg) IBW/kg (Calculated) : 60.45 Heparin Dosing Weight: 76  Vital Signs: Temp: 98.4 F (36.9 C) (09/20 0451) Temp Source: Oral (09/20 0451) BP: 94/56 (09/20 0451) Pulse Rate: 75 (09/20 0451)  Labs: Recent Labs    11/20/18 0801  11/21/18 0314  11/22/18 0525 11/22/18 1338 11/22/18 1724 11/22/18 2128 11/23/18 0554  HGB 8.2*  --  9.0*   < > 8.8*  --  9.7*  --  9.2*  HCT 24.1*  --  26.2*   < > 26.2*  --  29.3*  --  28.1*  PLT 189  --  210  --   --   --   --   --  281  LABPROT 14.4  --  14.5  --  13.4  --   --   --  13.1  INR 1.1  --  1.1  --  1.0  --   --   --  1.0  HEPARINUNFRC 0.11*   < >  --    < > 0.41 0.61  --  0.37 0.37  CREATININE 0.61  --  0.62  --  0.72  --   --   --   --    < > = values in this interval not displayed.    Estimated Creatinine Clearance: 81.6 mL/min (by C-G formula based on SCr of 0.72 mg/dL).  Assessment: 21 YOF presented with dark colored diarrhea, dizziness x 4 days, hypotension/syncope while using the bathroom.  Patient had severe anemia with INR > 10 that was reversed.  Pharmacy consulted to dose IV heparin for history of Afib and valve replacement. Coumadin restarted yesterday. Home Coumadin regimen 7.5 MWF, 5 mg STTS. Continue conservative Coumadin dosing d/t elevated INR and bleeding on admission.   Hgb 9.2, Plt WNL. Heparin level is therapeutic at 0.37, INR 1; no bleeding or infusion issues per the RN.     Goal of Therapy:  Heparin level 0.3 - 0.5 units/ml  INR 2.5-3.5 Monitor platelets by anticoagulation protocol: Yes   Plan:  Continue heparin gtt at 950 units/hr Give warfarin 5 mg x1 this evening Monitor daily HL, INR, CBC, and s/sx of bleeding  Agnes Lawrence, PharmD PGY1 Pharmacy Resident

## 2018-11-23 NOTE — Progress Notes (Signed)
PROGRESS NOTE                                                                                                                                                                                                             Patient Demographics:    Sharon Figueroa, is a 58 y.o. female, DOB - 10-25-1960, ZOX:[id number]  Admit date - 11/18/2018   Admitting Physician Ples Specter, MD  Outpatient Primary MD for the patient is Patient, No Pcp Per  LOS - 5  Chief Complaint  Patient presents with  . Dizziness  . Hypotension  . GI Bleeding       Brief Narrative  Patient is normally on warfarin with goal of 3-3.5 for her mechanical valve replacements (history of rheumatic fever as a child).  She started having profuse diarrhea with black stools, was found to have severe acute blood loss related anemia, admitted by ICU seen by GI underwent EGD showing possible acute upper GI bleed.  She was stabilized and transferred to hospitalist care on 11/22/2018.   Subjective:   Patient in bed, appears comfortable, denies any headache, no fever, no chest pain or pressure, no shortness of breath , no abdominal pain. No focal weakness.   Assessment  & Plan :     1.  Syncope, hypotension due to acute upper GI bleed related blood loss anemia.  Status post EGD colonoscopy, likely bleeding was from the 4 mm nonbleeding gastric ulcer found on EGD.  Colonoscopy nonacute.  She also had supratherapeutic INR upon admission.  So far she has received IV vitamin K, 2 units of FFP along with 3 units of packed RBCs in the ICU.  Her H&H and hemodynamics are now stable, she is currently on combination of IV PPI twice daily along with IV heparin and Coumadin combination, pharmacy monitoring INR goal INR is again 2.5-3.5.   2.  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  Goal INR is 2.5-3.5.  Home once goal  is reached.  DW Dr C.Bridgette cardiologist on 11/22/2018.  For now discontinue aspirin will have her follow-up with Dr. Jens Som in 2 to 3 weeks and resume aspirin if needed at that time.  Hopefully by that time also would have healed completely.   3.  Paroxysmal atrial fibrillation.  [place] vas 2 score will be 2-3.  Coumadin, goal INR 2.5-3.5.    Family Communication  :  None  Code Status : Full  Disposition Plan  : Home once INR reaches 2.5  Consults  : PCCM, GI  Procedures  :    9/17 EGD>>- 4 mm non-bleeding gastric ulcer with pigmented material, treated with bipolar cautery and erythematous mucosa in the antrum. Colonoscopy 9/18 >> polyps present, removed.  No other source or stigmata of bleeding noted.  DVT Prophylaxis  : Heparin Coumadin  Lab Results  Component Value Date   PLT 281 11/23/2018    Diet :  Diet Order            Diet regular Room service appropriate? Yes; Fluid consistency: Thin  Diet effective now               Inpatient Medications Scheduled Meds: . Chlorhexidine Gluconate Cloth  6 each Topical Daily  . pantoprazole (PROTONIX) IV  40 mg Intravenous Q12H  . warfarin  5 mg Oral ONCE-1800  . Warfarin - Pharmacist Dosing Inpatient   Does not apply q1800   Continuous Infusions: . sodium chloride Stopped (11/20/18 0612)  . heparin 950 Units/hr (11/23/18 0019)   PRN Meds:.sodium chloride, acetaminophen  Antibiotics  :   Anti-infectives (From admission, onward)   None          Objective:   Vitals:   11/22/18 0900 11/22/18 1737 11/22/18 2034 11/23/18 0451  BP: (!) 98/57 109/67 99/65 (!) 94/56  Pulse: 86 85 77 75  Resp: 16 18 16 18   Temp: 98.1 F (36.7 C) 98.3 F (36.8 C) 98.6 F (37 C) 98.4 F (36.9 C)  TempSrc: Oral Oral Oral Oral  SpO2: 99% 100% 100% 96%  Weight:      Height:        Wt Readings from Last 3 Encounters:  11/22/18 82 kg  11/01/17 70.3 kg  08/01/17 64 kg     Intake/Output Summary (Last 24 hours) at 11/23/2018  0924 Last data filed at 11/23/2018 0443 Gross per 24 hour  Intake 540 ml  Output 0 ml  Net 540 ml     Physical Exam  Awake Alert, Oriented X 3, No new F.N deficits, Normal affect [place].AT,PERRAL Supple Neck,No JVD, No cervical lymphadenopathy appriciated.  Symmetrical Chest wall movement, Good air movement bilaterally, CTAB RRR,No Gallops, Rubs, positive systolic murmur, No Parasternal Heave +ve B.Sounds, Abd Soft, No tenderness, No organomegaly appriciated, No rebound - guarding or rigidity. No Cyanosis, Clubbing or edema, No new Rash or bruise    Data Review:    CBC Recent Labs  Lab 11/18/18 1731 11/18/18 2245  11/19/18 0411  11/20/18 0801 11/21/18 0314 11/21/18 1504 11/21/18 1817 11/22/18 0525 11/22/18 1724 11/23/18 0554  WBC 29.9* 19.4*  --  18.3*  --  12.4* 10.1  --   --   --   --  7.0  HGB 3.4* 5.5*   < > 7.4*   < > 8.2* 9.0* 9.2* 9.5* 8.8* 9.7* 9.2*  HCT 10.2* 16.0*   < > 20.9*   < > 24.1* 26.2* 28.4* 28.4* 26.2* 29.3* 28.1*  PLT 239 164  --  171  --  189 210  --   --   --   --  281  MCV 99.0 95.2  --  91.3  --  93.1 93.6  --   --   --   --  96.6  MCH 33.0 32.7  --  32.3  --  31.7 32.1  --   --   --   --  31.6  MCHC 33.3 34.4  --  35.4  --  34.0 34.4  --   --   --   --  32.7  RDW 15.1 13.2  --  13.9  --  14.6 15.9*  --   --   --   --  15.2  LYMPHSABS 4.2*  --   --   --   --   --   --   --   --   --   --   --   MONOABS 2.4*  --   --   --   --   --   --   --   --   --   --   --   EOSABS 0.3  --   --   --   --   --   --   --   --   --   --   --   BASOSABS 0.0  --   --   --   --   --   --   --   --   --   --   --    < > = values in this interval not displayed.    Chemistries  Recent Labs  Lab 11/18/18 1731 11/19/18 0411 11/19/18 1836 11/20/18 0801 11/21/18 0314 11/22/18 0525  NA 134* 137 138 137 139 141  K 3.7 3.4* 3.7 3.5 3.2* 3.7  CL 105 108 106 104 107 108  CO2 20* 21* 25 25 25 26   GLUCOSE 137* 122* 104* 101* 100* 99  BUN 43* 24* 15 10 5* 6   CREATININE 1.10* 0.68 0.61 0.61 0.62 0.72  CALCIUM 7.6* 7.8* 7.9* 7.5* 8.0* 8.0*  MG 1.6* 2.4 2.2 2.2 2.2  --   AST 17  --   --   --   --   --   ALT 12  --   --   --   --   --   ALKPHOS 37*  --   --   --   --   --   BILITOT 0.5  --   --   --   --   --    ------------------------------------------------------------------------------------------------------------------ No results for input(s): CHOL, HDL, LDLCALC, TRIG, CHOLHDL, LDLDIRECT in the last 72 hours.  Lab Results  Component Value Date   HGBA1C 5.3 03/30/2017   ------------------------------------------------------------------------------------------------------------------ No results for input(s): TSH, T4TOTAL, T3FREE, THYROIDAB in the last 72 hours.  Invalid input(s): FREET3 ------------------------------------------------------------------------------------------------------------------ No results for input(s): VITAMINB12, FOLATE, FERRITIN, TIBC, IRON, RETICCTPCT in the last 72 hours.  Coagulation profile Recent Labs  Lab 11/19/18 0411 11/20/18 0801 11/21/18 0314 11/22/18 0525 11/23/18 0554  INR 1.4* 1.1 1.1 1.0 1.0    No results for input(s): DDIMER in the last 72 hours.  Cardiac Enzymes No results for input(s): CKMB, TROPONINI, MYOGLOBIN in the last 168 hours.  Invalid input(s): CK ------------------------------------------------------------------------------------------------------------------    Component Value Date/Time   BNP 249.3 (H) 11/18/2018 1732    Micro Results Recent Results (from the past 240 hour(s))  SARS Coronavirus 2 [facility](Hospital order, [facility]) Nasopharyngeal Nasopharyngeal Swab     Status: None   Collection Time: 11/18/18  6:30 PM   Specimen: Nasopharyngeal Swab  Result Value Ref Range Status   SARS Coronavirus 2 NEGATIVE NEGATIVE Final    Comment: (NOTE) If result is NEGATIVE SARS-CoV-2 target nucleic acids are NOT DETECTED. The SARS-CoV-2 RNA is  generally detectable in upper and lower  respiratory specimens  during the acute phase of infection. The lowest  concentration of SARS-CoV-2 viral copies this assay can detect is 250  copies / mL. A negative result does not preclude SARS-CoV-2 infection  and should not be used as the sole basis for treatment or other  patient management decisions.  A negative result may occur with  improper specimen collection / handling, submission of specimen other  than nasopharyngeal swab, presence of viral mutation(s) within the  areas targeted by this assay, and inadequate number of viral copies  (<250 copies / mL). A negative result must be combined with clinical  observations, patient history, and epidemiological information. If result is POSITIVE SARS-CoV-2 target nucleic acids are DETECTED. The SARS-CoV-2 RNA is generally detectable in upper and lower  respiratory specimens dur ing the acute phase of infection.  Positive  results are indicative of active infection with SARS-CoV-2.  Clinical  correlation with patient history and other diagnostic information is  necessary to determine patient infection status.  Positive results do  not rule out bacterial infection or co-infection with other viruses. If result is PRESUMPTIVE POSTIVE SARS-CoV-2 nucleic acids MAY BE PRESENT.   A presumptive positive result was obtained on the submitted specimen  and confirmed on repeat testing.  While 2019 novel coronavirus  (SARS-CoV-2) nucleic acids may be present in the submitted sample  additional confirmatory testing may be necessary for epidemiological  and / or clinical management purposes  to differentiate between  SARS-CoV-2 and other Sarbecovirus currently known to infect humans.  If clinically indicated additional testing with an alternate test  methodology [id number]) is advised. The SARS-CoV-2 RNA is generally  detectable in upper and lower respiratory sp ecimens during the acute  phase of infection.  The expected result is Negative. Fact Sheet for Patients:  BoilerBrush.com.cy Fact Sheet for Healthcare Providers: https://pope.com/ This test is not yet approved or cleared by the [place] FDA and has been authorized for detection and/or diagnosis of SARS-CoV-2 by FDA under an Emergency Use Authorization (EUA).  This EUA will remain in effect (meaning this test can be used) for the duration of the COVID-19 declaration under Section 564(b)(1) of the Act, 21 U.S.C. section 360bbb-3(b)(1), unless the authorization is terminated or revoked sooner. Performed at [facility], [address], [place], [place] [address]   MRSA PCR Screening     Status: None   Collection Time: 11/18/18 11:07 PM   Specimen: Nasopharyngeal  Result Value Ref Range Status   MRSA by PCR NEGATIVE NEGATIVE Final    Comment:        The GeneXpert MRSA Assay (FDA approved for NASAL specimens only), is one component of a comprehensive MRSA colonization surveillance program. It is not intended to diagnose MRSA infection nor to guide or monitor treatment for MRSA infections. Performed at [facility], [address] [place], [place] [address]     Radiology Reports Dg Chest Portable 1 View  Result Date: 11/18/2018 CLINICAL DATA:  Four-day history fatigue, dizziness and syncope. Hypotension and tachycardia upon emergency department arrival. EXAM: PORTABLE CHEST 1 VIEW COMPARISON:  05/08/2017 and earlier. FINDINGS: Prior sternotomy for aortic and mitral valve replacement. Prior LEFT atrial appendage clipping. Cardiac silhouette upper normal in size, unchanged. Lungs clear. Bronchovascular markings normal. Pulmonary vascularity normal. No visible pleural effusions. No pneumothorax. IMPRESSION: No acute cardiopulmonary disease. Electronically Signed   By: Hulan Saas M.D.   On: 11/18/2018 18:05    Time Spent in minutes  30   Susa Raring  M.D on  11/23/2018 at 9:24 AM  To page go to [email or web address] - password [email or web address]

## 2018-11-24 LAB — CBC
HCT: 30.3 % — ABNORMAL LOW (ref 36.0–46.0)
Hemoglobin: 9.5 g/dL — ABNORMAL LOW (ref 12.0–15.0)
MCH: 30.4 pg (ref 26.0–34.0)
MCHC: 31.4 g/dL (ref 30.0–36.0)
MCV: 97.1 fL (ref 80.0–100.0)
Platelets: 351 10*3/uL (ref 150–400)
RBC: 3.12 MIL/uL — ABNORMAL LOW (ref 3.87–5.11)
RDW: 14.8 % (ref 11.5–15.5)
WBC: 6.2 10*3/uL (ref 4.0–10.5)
nRBC: 0 % (ref 0.0–0.2)

## 2018-11-24 LAB — PROTIME-INR
INR: 1.1 (ref 0.8–1.2)
Prothrombin Time: 14.3 seconds (ref 11.4–15.2)

## 2018-11-24 LAB — H PYLORI, IGM, IGG, IGA AB
H Pylori IgG: 0.4 Index Value (ref 0.00–0.79)
H. Pylogi, Iga Abs: <9 units (ref 0.0–8.9)
H. Pylogi, Igm Abs: <9 units (ref 0.0–8.9)

## 2018-11-24 LAB — HEPARIN LEVEL (UNFRACTIONATED): Heparin Unfractionated: 0.36 IU/mL (ref 0.30–0.70)

## 2018-11-24 LAB — SURGICAL PATHOLOGY

## 2018-11-24 MED ORDER — WARFARIN SODIUM 10 MG PO TABS
10.0000 mg | ORAL_TABLET | Freq: Once | ORAL | Status: AC
  Administered 2018-11-24: 10 mg via ORAL
  Filled 2018-11-24: qty 1

## 2018-11-24 NOTE — Progress Notes (Signed)
ANTICOAGULATION CONSULT NOTE  Pharmacy Consult for Heparin + Warfarin Indication: atrial fibrillation and valve replacement  No Known Allergies  Patient Measurements: Height: 5' 6.5" (168.9 cm) Weight: 178 lb 8 oz (81 kg) IBW/kg (Calculated) : 60.45 Heparin Dosing Weight: 76  Vital Signs: Temp: 98.6 F (37 C) (09/21 0418) Temp Source: Oral (09/21 0418) BP: 94/52 (09/21 0418) Pulse Rate: 73 (09/21 0418)  Labs: Recent Labs    11/22/18 0525  11/22/18 1724 11/22/18 2128 11/23/18 0554 11/24/18 0617  HGB 8.8*  --  9.7*  --  9.2* 9.5*  HCT 26.2*  --  29.3*  --  28.1* 30.3*  PLT  --   --   --   --  281 351  LABPROT 13.4  --   --   --  13.1 14.3  INR 1.0  --   --   --  1.0 1.1  HEPARINUNFRC 0.41   < >  --  0.37 0.37 0.36  CREATININE 0.72  --   --   --   --   --    < > = values in this interval not displayed.    Estimated Creatinine Clearance: 81.1 mL/min (by C-G formula based on SCr of 0.72 mg/dL).  Assessment: 7 YOF presented with dark colored diarrhea, dizziness x 4 days, hypotension/syncope while using the bathroom.  Patient had severe anemia with INR > 10 that was reversed.  Pharmacy consulted to dose IV heparin for history of Afib and valve replacement. Warfarin resumed 9/19  Home Coumadin regimen 7.5 MWF, 5 mg STTS.   Heparin level this morning is therapeutic (HL 0.36 << 0.37, goal of 0.3-0.5). INR today remains SUBtherapeutic and will likely be slow to trend up due to Vit K administration on admission (INR 1.1 << 1, goal of 2.5-3.5).     Goal of Therapy:  Heparin level 0.3 - 0.5 units/ml  INR 2.5-3.5 Monitor platelets by anticoagulation protocol: Yes   Plan:  - Continue Heparin at 950 units/hr (9.5 ml/hr) - Warfarin 10 mg x 1 dose at 1800 today - Will continue to monitor for any signs/symptoms of bleeding and will follow up with heparin level and INR in the a.m.   Thank you for allowing pharmacy to be a part of this patient's care.  Alycia Rossetti, PharmD,  BCPS Clinical Pharmacist 11/24/2018 7:49 AM   **Pharmacist phone directory can now be found on [email or web address] ([email or web address]).  Listed under [place].

## 2018-11-24 NOTE — Progress Notes (Signed)
PROGRESS NOTE                                                                                                                                                                                                             Patient Demographics:    Sharon Figueroa, is a 58 y.o. female, DOB - [birth year], FHL:[id number]  Admit date - 11/18/2018   Admitting Physician Ples Specter, MD  Outpatient Primary MD for the patient is Patient, No Pcp Per  LOS - 6  Chief Complaint  Patient presents with  . Dizziness  . Hypotension  . GI Bleeding       Brief Narrative  Patient is normally on warfarin with goal of 3-3.5 for her mechanical valve replacements (history of rheumatic fever as a child).  She started having profuse diarrhea with black stools, was found to have severe acute blood loss related anemia, admitted by ICU seen by GI underwent EGD showing possible acute upper GI bleed.  She was stabilized and transferred to hospitalist care on 11/22/2018.   Subjective:   Patient in bed, appears comfortable, denies any headache, no fever, no chest pain or pressure, no shortness of breath , no abdominal pain. No focal weakness.    Assessment  & Plan :     1.  Syncope, hypotension due to acute upper GI bleed related blood loss anemia.  Status post EGD colonoscopy, likely bleeding was from the 4 mm nonbleeding gastric ulcer found on EGD.  Colonoscopy nonacute.  She also had supratherapeutic INR upon admission.  So far she has received IV vitamin K, 2 units of FFP along with 3 units of packed RBCs in the ICU.  Her H&H and hemodynamics are now stable, she is currently on combination of IV PPI twice daily along with IV heparin and Coumadin combination, pharmacy monitoring INR goal INR is again 2.5-3.5.   2.  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  Goal INR is 2.5-3.5.  Home once goal  is reached.  DW Dr C.Bridgette cardiologist on 11/22/2018.  For now discontinue aspirin will have her follow-up with Dr. Jens Som in 2 to 3 weeks and resume aspirin if needed at that time.  Hopefully by that time also would have healed completely.  Pharmacist was requested to talk to the patient on 11/24/2018 as she wanted update on her Coumadin dosing.   3.  Paroxysmal atrial fibrillation.  [place] vas 2 score will be 2-3.  Coumadin, goal INR 2.5-3.5.    Family Communication  : None  Code Status : Full  Disposition Plan  : Home once INR reaches 2.5  Consults  : PCCM, GI  Procedures  :    9/17 EGD>>- 4 mm non-bleeding gastric ulcer with pigmented material, treated with bipolar cautery and erythematous mucosa in the antrum. Colonoscopy 9/18 >> polyps present, removed.  No other source or stigmata of bleeding noted.  DVT Prophylaxis  : Heparin Coumadin  Lab Results  Component Value Date   PLT 351 11/24/2018    Diet :  Diet Order            Diet regular Room service appropriate? Yes; Fluid consistency: Thin  Diet effective now               Inpatient Medications Scheduled Meds: . Chlorhexidine Gluconate Cloth  6 each Topical Daily  . pantoprazole (PROTONIX) IV  40 mg Intravenous Q12H  . warfarin  10 mg Oral ONCE-1800  . Warfarin - Pharmacist Dosing Inpatient   Does not apply q1800   Continuous Infusions: . sodium chloride Stopped (11/20/18 0612)  . heparin 950 Units/hr (11/24/18 0424)   PRN Meds:.sodium chloride, acetaminophen  Antibiotics  :   Anti-infectives (From admission, onward)   None          Objective:   Vitals:   11/23/18 1714 11/23/18 2024 11/24/18 0418 11/24/18 0901  BP: (!) 108/48 (!) 110/52 (!) 94/52 (!) 102/58  Pulse: 73 78 73 75  Resp: 18 16 16 18   Temp: 98.4 F (36.9 C) 99 F (37.2 C) 98.6 F (37 C) 97.8 F (36.6 C)  TempSrc: Oral Oral Oral Oral  SpO2: 99% 98% 93% 98%  Weight:   81 kg   Height:        Wt Readings from Last 3  Encounters:  11/24/18 81 kg  11/01/17 70.3 kg  08/01/17 64 kg    No intake or output data in the 24 hours ending 11/24/18 1107   Physical Exam   UNCHANGED FROM 11/23/18  Awake Alert, Oriented X 3, No new F.N deficits, Normal affect [place].AT,PERRAL Supple Neck,No JVD, No cervical lymphadenopathy appriciated.  Symmetrical Chest wall movement, Good air movement bilaterally, CTAB RRR,No Gallops, Rubs, positive systolic murmur, No Parasternal Heave +ve B.Sounds, Abd Soft, No tenderness, No organomegaly appriciated, No rebound - guarding or rigidity. No Cyanosis, Clubbing or edema, No new Rash or bruise    Data Review:    CBC Recent Labs  Lab 11/18/18 1731  11/19/18 0411  11/20/18 0801 11/21/18 0314  11/21/18 1817 11/22/18 0525 11/22/18 1724 11/23/18 0554 11/24/18 0617  WBC 29.9*   < > 18.3*  --  12.4* 10.1  --   --   --   --  7.0 6.2  HGB 3.4*   < > 7.4*   < > 8.2* 9.0*   < > 9.5* 8.8* 9.7* 9.2* 9.5*  HCT 10.2*   < > 20.9*   < > 24.1* 26.2*   < > 28.4* 26.2* 29.3* 28.1* 30.3*  PLT 239   < > 171  --  189 210  --   --   --   --  281 351  MCV 99.0   < > 91.3  --  93.1 93.6  --   --   --   --  96.6 97.1  MCH 33.0   < > 32.3  --  31.7 32.1  --   --   --   --  31.6 30.4  MCHC 33.3   < > 35.4  --  34.0 34.4  --   --   --   --  32.7 31.4  RDW 15.1   < > 13.9  --  14.6 15.9*  --   --   --   --  15.2 14.8  LYMPHSABS 4.2*  --   --   --   --   --   --   --   --   --   --   --   MONOABS 2.4*  --   --   --   --   --   --   --   --   --   --   --   EOSABS 0.3  --   --   --   --   --   --   --   --   --   --   --   BASOSABS 0.0  --   --   --   --   --   --   --   --   --   --   --    < > = values in this interval not displayed.    Chemistries  Recent Labs  Lab 11/18/18 1731 11/19/18 0411 11/19/18 1836 11/20/18 0801 11/21/18 0314 11/22/18 0525  NA 134* 137 138 137 139 141  K 3.7 3.4* 3.7 3.5 3.2* 3.7  CL 105 108 106 104 107 108  CO2 20* 21* 25 25 25 26   GLUCOSE 137* 122* 104*  101* 100* 99  BUN 43* 24* 15 10 5* 6  CREATININE 1.10* 0.68 0.61 0.61 0.62 0.72  CALCIUM 7.6* 7.8* 7.9* 7.5* 8.0* 8.0*  MG 1.6* 2.4 2.2 2.2 2.2  --   AST 17  --   --   --   --   --   ALT 12  --   --   --   --   --   ALKPHOS 37*  --   --   --   --   --   BILITOT 0.5  --   --   --   --   --    ------------------------------------------------------------------------------------------------------------------ No results for input(s): CHOL, HDL, LDLCALC, TRIG, CHOLHDL, LDLDIRECT in the last 72 hours.  Lab Results  Component Value Date   HGBA1C 5.3 03/30/2017   ------------------------------------------------------------------------------------------------------------------ No results for input(s): TSH, T4TOTAL, T3FREE, THYROIDAB in the last 72 hours.  Invalid input(s): FREET3 ------------------------------------------------------------------------------------------------------------------ No results for input(s): VITAMINB12, FOLATE, FERRITIN, TIBC, IRON, RETICCTPCT in the last 72 hours.  Coagulation profile Recent Labs  Lab 11/20/18 0801 11/21/18 0314 11/22/18 0525 11/23/18 0554 11/24/18 0617  INR 1.1 1.1 1.0 1.0 1.1    No results for input(s): DDIMER in the last 72 hours.  Cardiac Enzymes No results for input(s): CKMB, TROPONINI, MYOGLOBIN in the last 168 hours.  Invalid input(s): CK ------------------------------------------------------------------------------------------------------------------    Component Value Date/Time   BNP 249.3 (H) 11/18/2018 1732    Micro Results Recent Results (from the past 240 hour(s))  SARS Coronavirus 2 [facility] order, Performed in [facility] hospital lab) Nasopharyngeal Nasopharyngeal Swab     Status: None   Collection Time: 11/18/18  6:30 PM   Specimen: Nasopharyngeal Swab  Result Value Ref Range Status   SARS Coronavirus 2 NEGATIVE NEGATIVE Final    Comment: (NOTE) If result is NEGATIVE SARS-CoV-2 target nucleic acids are NOT  DETECTED. The SARS-CoV-2 RNA is generally detectable in upper  and lower  respiratory specimens during the acute phase of infection. The lowest  concentration of SARS-CoV-2 viral copies this assay can detect is 250  copies / mL. A negative result does not preclude SARS-CoV-2 infection  and should not be used as the sole basis for treatment or other  patient management decisions.  A negative result may occur with  improper specimen collection / handling, submission of specimen other  than nasopharyngeal swab, presence of viral mutation(s) within the  areas targeted by this assay, and inadequate number of viral copies  (<250 copies / mL). A negative result must be combined with clinical  observations, patient history, and epidemiological information. If result is POSITIVE SARS-CoV-2 target nucleic acids are DETECTED. The SARS-CoV-2 RNA is generally detectable in upper and lower  respiratory specimens dur ing the acute phase of infection.  Positive  results are indicative of active infection with SARS-CoV-2.  Clinical  correlation with patient history and other diagnostic information is  necessary to determine patient infection status.  Positive results do  not rule out bacterial infection or co-infection with other viruses. If result is PRESUMPTIVE POSTIVE SARS-CoV-2 nucleic acids MAY BE PRESENT.   A presumptive positive result was obtained on the submitted specimen  and confirmed on repeat testing.  While 2019 novel coronavirus  (SARS-CoV-2) nucleic acids may be present in the submitted sample  additional confirmatory testing may be necessary for epidemiological  and / or clinical management purposes  to differentiate between  SARS-CoV-2 and other Sarbecovirus currently known to infect humans.  If clinically indicated additional testing with an alternate test  methodology [phone]([id number]) is advised. The SARS-CoV-2 RNA is generally  detectable in upper and lower respiratory sp ecimens during  the acute  phase of infection. The expected result is Negative. Fact Sheet for Patients:  BoilerBrush.com.cyhttps://www.fda.gov/media/136312/download Fact Sheet for Healthcare Providers: https://pope.com/https://www.fda.gov/media/136313/download This test is not yet approved or cleared by the [place] FDA and has been authorized for detection and/or diagnosis of SARS-CoV-2 by FDA under an Emergency Use Authorization (EUA).  This EUA will remain in effect (meaning this test can be used) for the duration of the COVID-19 declaration under Section 564(b)(1) of the Act, 21 U.S.C. section 360bbb-3(b)(1), unless the authorization is terminated or revoked sooner. Performed at [facility], [address] [place], [place] [address]   MRSA PCR Screening     Status: None   Collection Time: 11/18/18 11:07 PM   Specimen: Nasopharyngeal  Result Value Ref Range Status   MRSA by PCR NEGATIVE NEGATIVE Final    Comment:        The GeneXpert MRSA Assay (FDA approved for NASAL specimens only), is one component of a comprehensive MRSA colonization surveillance program. It is not intended to diagnose MRSA infection nor to guide or monitor treatment for MRSA infections. Performed at [facility], [address] [place], [place] [address]     Radiology Reports Dg Chest Portable 1 View  Result Date: 11/18/2018 CLINICAL DATA:  Four-day history fatigue, dizziness and syncope. Hypotension and tachycardia upon emergency department arrival. EXAM: PORTABLE CHEST 1 VIEW COMPARISON:  05/08/2017 and earlier. FINDINGS: Prior sternotomy for aortic and mitral valve replacement. Prior LEFT atrial appendage clipping. Cardiac silhouette upper normal in size, unchanged. Lungs clear. Bronchovascular markings normal. Pulmonary vascularity normal. No visible pleural effusions. No pneumothorax. IMPRESSION: No acute cardiopulmonary disease. Electronically Signed   By: Hulan Saashomas  Lawrence M.D.   On: 11/18/2018 18:05    Time Spent in minutes  [place] M.D on 11/24/2018 at 11:07 AM  To page go to [email or web address] - password [email or web address]

## 2018-11-25 LAB — HEPARIN LEVEL (UNFRACTIONATED): Heparin Unfractionated: 0.32 IU/mL (ref 0.30–0.70)

## 2018-11-25 LAB — PROTIME-INR
INR: 1.3 — ABNORMAL HIGH (ref 0.8–1.2)
Prothrombin Time: 15.6 seconds — ABNORMAL HIGH (ref 11.4–15.2)

## 2018-11-25 LAB — CBC
HCT: 30.1 % — ABNORMAL LOW (ref 36.0–46.0)
Hemoglobin: 9.5 g/dL — ABNORMAL LOW (ref 12.0–15.0)
MCH: 30.7 pg (ref 26.0–34.0)
MCHC: 31.6 g/dL (ref 30.0–36.0)
MCV: 97.4 fL (ref 80.0–100.0)
Platelets: 367 10*3/uL (ref 150–400)
RBC: 3.09 MIL/uL — ABNORMAL LOW (ref 3.87–5.11)
RDW: 14.6 % (ref 11.5–15.5)
WBC: 7.4 10*3/uL (ref 4.0–10.5)
nRBC: 0 % (ref 0.0–0.2)

## 2018-11-25 MED ORDER — ENOXAPARIN (LOVENOX) PATIENT EDUCATION KIT
PACK | Freq: Once | Status: AC
  Administered 2018-11-25: 10:00:00
  Filled 2018-11-25: qty 1

## 2018-11-25 MED ORDER — WARFARIN SODIUM 10 MG PO TABS
10.0000 mg | ORAL_TABLET | Freq: Once | ORAL | Status: AC
  Administered 2018-11-25: 10 mg via ORAL
  Filled 2018-11-25: qty 1

## 2018-11-25 MED ORDER — ENOXAPARIN SODIUM 80 MG/0.8ML ~~LOC~~ SOLN
80.0000 mg | Freq: Two times a day (BID) | SUBCUTANEOUS | Status: DC
  Administered 2018-11-25 – 2018-11-26 (×3): 80 mg via SUBCUTANEOUS
  Filled 2018-11-25 (×3): qty 0.8

## 2018-11-25 NOTE — TOC Benefit Eligibility Note (Signed)
Transition of Care [facility]) Benefit Eligibility Note    Patient Details  Name: Sharon Figueroa MRN: [id number] Date of Birth: 1960/11/29   Medication/Dose: Enoxaparin 80mg  bid x7 days  Covered?: Yes     Prescription Coverage Preferred Pharmacy: most major retail pharmacies  Spoke with Person/Company/Phone Number:: [employer]  Co-Pay: $331  Prior Approval: No  Deductible: Unmet(deductible is 1750, remaining amount $618.57)       [place] Phone Number: 11/25/2018, 3:06 PM

## 2018-11-25 NOTE — Progress Notes (Signed)
PROGRESS NOTE                                                                                                                                                                                                             Patient Demographics:    Sharon Figueroa, is a 58 y.o. female, DOB - Dec 04, 1960, LOV:[id number]  Admit date - 11/18/2018   Admitting Physician Jacalyn Lefevre, MD  Outpatient Primary MD for the patient is Patient, No Pcp Per  LOS - 7  Chief Complaint  Patient presents with  . Dizziness  . Hypotension  . GI Bleeding       Brief Narrative  Patient is normally on warfarin with goal of 3-3.5 for her mechanical valve replacements (history of rheumatic fever as a child).  She started having profuse diarrhea with black stools, was found to have severe acute blood loss related anemia, admitted by ICU seen by GI underwent EGD showing possible acute upper GI bleed.  She was stabilized and transferred to hospitalist care on 11/22/2018.   Subjective:   Patient in bed, appears comfortable, denies any headache, no fever, no chest pain or pressure, no shortness of breath , no abdominal pain. No focal weakness.    Assessment  & Plan :     1.  Syncope, hypotension due to acute upper GI bleed related blood loss anemia.  Status post EGD colonoscopy, likely bleeding was from the 4 mm nonbleeding gastric ulcer found on EGD.  Colonoscopy nonacute.  She also had supratherapeutic INR upon admission.  So far she has received IV vitamin K, 2 units of FFP along with 3 units of packed RBCs in the ICU.  Her H&H and hemodynamics are now stable, she is currently on combination of IV PPI twice daily along with IV heparin and Coumadin combination, pharmacy monitoring INR goal INR is again 2.5-3.5.  Discussed with cardiac pharmacist on 11/25/2018 safe to switch from heparin drip to Lovenox for bridging, will do later today.   2.  History of rheumatic heart disease with aortic and mitral valve  replacement (mechanical) - per cardiac pharmacist she was switched to Lovenox from heparin drip along with Coumadin on 11/25/2018.  Goal INR is 2.5-3.5.  Home once goal is reached.  DW Dr C.Bridgette cardiologist on 11/22/2018.  For now discontinue aspirin will have her follow-up with Dr. Stanford Breed in 2 to 3 weeks and resume aspirin if needed at that time.  Hopefully by that time also would have healed completely.  If H&H INR remained stable likely discharge on Lovenox and Coumadin bridge on 11/26/2018.  Case management to talk to patient about Lovenox co-pay.   3.  Paroxysmal atrial fibrillation.  [place] vas 2 score will be 2-3.  Coumadin, goal INR 2.5-3.5.    Family Communication  : None  Code Status : Full  Disposition Plan  : Home once INR reaches 2.5  Consults  : PCCM, GI  Procedures  :    9/17 EGD>>- 4 mm non-bleeding gastric ulcer with pigmented material, treated with bipolar cautery and erythematous mucosa in the antrum. Colonoscopy 9/18 >> polyps present, removed.  No other source or stigmata of bleeding noted.  DVT Prophylaxis  : Heparin/Lovenox later today with Coumadin  Lab Results  Component Value Date   PLT 367 11/25/2018   Lab Results  Component Value Date   INR 1.3 (H) 11/25/2018   INR 1.1 11/24/2018   INR 1.0 11/23/2018     Diet :  Diet Order            Diet regular Room service appropriate? Yes; Fluid consistency: Thin  Diet effective now               Inpatient Medications Scheduled Meds: . Chlorhexidine Gluconate Cloth  6 each Topical Daily  . enoxaparin (LOVENOX) injection  80 mg Subcutaneous Q12H  . enoxaparin   Does not apply Once  . pantoprazole (PROTONIX) IV  40 mg Intravenous Q12H  . warfarin  10 mg Oral ONCE-1800  . Warfarin - Pharmacist Dosing Inpatient   Does not apply q1800   Continuous Infusions: . sodium chloride Stopped (11/20/18 0612)   PRN Meds:.sodium chloride, acetaminophen  Antibiotics  :   Anti-infectives (From admission,  onward)   None          Objective:   Vitals:   11/24/18 0901 11/24/18 1622 11/24/18 2037 11/25/18 0522  BP: (!) 102/58 (!) 99/58 (!) 110/59 (!) 117/54  Pulse: 75 73 80 73  Resp: 18 18 18 18   Temp: 97.8 F (36.6 C) (!) 97.5 F (36.4 C) 98.3 F (36.8 C) 98.9 F (37.2 C)  TempSrc: Oral Oral Oral Oral  SpO2: 98% 98% 100% 94%  Weight:   81 kg   Height:        Wt Readings from Last 3 Encounters:  11/24/18 81 kg  11/01/17 70.3 kg  08/01/17 64 kg     Intake/Output Summary (Last 24 hours) at 11/25/2018 0941 Last data filed at 11/25/2018 0800 Gross per 24 hour  Intake 840 ml  Output 0 ml  Net 840 ml     Physical Exam   Awake Alert, Oriented X 3, No new F.N deficits, Normal affect [place].AT,PERRAL Supple Neck,No JVD, No cervical lymphadenopathy appriciated.  Symmetrical Chest wall movement, Good air movement bilaterally, CTAB RRR,No Gallops, Rubs, chronic systolic murmur, No Parasternal Heave +ve B.Sounds, Abd Soft, No tenderness, No organomegaly appriciated, No rebound - guarding or rigidity. No Cyanosis, Clubbing or edema, No new Rash or bruise     Data Review:    CBC Recent Labs  Lab 11/18/18 1731  11/20/18 0801 11/21/18 0314  11/22/18 0525 11/22/18 1724 11/23/18 0554 11/24/18 0617 11/25/18 0551  WBC 29.9*   < > 12.4* 10.1  --   --   --  7.0 6.2 7.4  HGB 3.4*   < > 8.2* 9.0*   < > 8.8* 9.7* 9.2* 9.5* 9.5*  HCT 10.2*   < > 24.1* 26.2*   < >  26.2* 29.3* 28.1* 30.3* 30.1*  PLT 239   < > 189 210  --   --   --  281 351 367  MCV 99.0   < > 93.1 93.6  --   --   --  96.6 97.1 97.4  MCH 33.0   < > 31.7 32.1  --   --   --  31.6 30.4 30.7  MCHC 33.3   < > 34.0 34.4  --   --   --  32.7 31.4 31.6  RDW 15.1   < > 14.6 15.9*  --   --   --  15.2 14.8 14.6  LYMPHSABS 4.2*  --   --   --   --   --   --   --   --   --   MONOABS 2.4*  --   --   --   --   --   --   --   --   --   EOSABS 0.3  --   --   --   --   --   --   --   --   --   BASOSABS 0.0  --   --   --   --   --    --   --   --   --    < > = values in this interval not displayed.    Chemistries  Recent Labs  Lab 11/18/18 1731 11/19/18 0411 11/19/18 1836 11/20/18 0801 11/21/18 0314 11/22/18 0525  NA 134* 137 138 137 139 141  K 3.7 3.4* 3.7 3.5 3.2* 3.7  CL 105 108 106 104 107 108  CO2 20* 21* 25 25 25 26   GLUCOSE 137* 122* 104* 101* 100* 99  BUN 43* 24* 15 10 5* 6  CREATININE 1.10* 0.68 0.61 0.61 0.62 0.72  CALCIUM 7.6* 7.8* 7.9* 7.5* 8.0* 8.0*  MG 1.6* 2.4 2.2 2.2 2.2  --   AST 17  --   --   --   --   --   ALT 12  --   --   --   --   --   ALKPHOS 37*  --   --   --   --   --   BILITOT 0.5  --   --   --   --   --    ------------------------------------------------------------------------------------------------------------------ No results for input(s): CHOL, HDL, LDLCALC, TRIG, CHOLHDL, LDLDIRECT in the last 72 hours.  Lab Results  Component Value Date   HGBA1C 5.3 03/30/2017   ------------------------------------------------------------------------------------------------------------------ No results for input(s): TSH, T4TOTAL, T3FREE, THYROIDAB in the last 72 hours.  Invalid input(s): FREET3 ------------------------------------------------------------------------------------------------------------------ No results for input(s): VITAMINB12, FOLATE, FERRITIN, TIBC, IRON, RETICCTPCT in the last 72 hours.  Coagulation profile Recent Labs  Lab 11/21/18 0314 11/22/18 0525 11/23/18 0554 11/24/18 0617 11/25/18 0551  INR 1.1 1.0 1.0 1.1 1.3*    No results for input(s): DDIMER in the last 72 hours.  Cardiac Enzymes No results for input(s): CKMB, TROPONINI, MYOGLOBIN in the last 168 hours.  Invalid input(s): CK ------------------------------------------------------------------------------------------------------------------    Component Value Date/Time   BNP 249.3 (H) 11/18/2018 1732    Micro Results Recent Results (from the past 240 hour(s))  SARS Coronavirus 2  [facility](Hospital order, Performed in [facility] hospital lab) Nasopharyngeal Nasopharyngeal Swab     Status: None   Collection Time: 11/18/18  6:30 PM   Specimen: Nasopharyngeal Swab  Result Value Ref Range Status  SARS Coronavirus 2 NEGATIVE NEGATIVE Final    Comment: (NOTE) If result is NEGATIVE SARS-CoV-2 target nucleic acids are NOT DETECTED. The SARS-CoV-2 RNA is generally detectable in upper and lower  respiratory specimens during the acute phase of infection. The lowest  concentration of SARS-CoV-2 viral copies this assay can detect is 250  copies / mL. A negative result does not preclude SARS-CoV-2 infection  and should not be used as the sole basis for treatment or other  patient management decisions.  A negative result may occur with  improper specimen collection / handling, submission of specimen other  than nasopharyngeal swab, presence of viral mutation(s) within the  areas targeted by this assay, and inadequate number of viral copies  (<250 copies / mL). A negative result must be combined with clinical  observations, patient history, and epidemiological information. If result is POSITIVE SARS-CoV-2 target nucleic acids are DETECTED. The SARS-CoV-2 RNA is generally detectable in upper and lower  respiratory specimens dur ing the acute phase of infection.  Positive  results are indicative of active infection with SARS-CoV-2.  Clinical  correlation with patient history and other diagnostic information is  necessary to determine patient infection status.  Positive results do  not rule out bacterial infection or co-infection with other viruses. If result is PRESUMPTIVE POSTIVE SARS-CoV-2 nucleic acids MAY BE PRESENT.   A presumptive positive result was obtained on the submitted specimen  and confirmed on repeat testing.  While 2019 novel coronavirus  (SARS-CoV-2) nucleic acids may be present in the submitted sample  additional confirmatory testing may be necessary for  epidemiological  and / or clinical management purposes  to differentiate between  SARS-CoV-2 and other Sarbecovirus currently known to infect humans.  If clinically indicated additional testing with an alternate test  methodology [phone]) is advised. The SARS-CoV-2 RNA is generally  detectable in upper and lower respiratory sp ecimens during the acute  phase of infection. The expected result is Negative. Fact Sheet for Patients:  BoilerBrush.com.cy Fact Sheet for Healthcare Providers: https://pope.com/ This test is not yet approved or cleared by the [place] FDA and has been authorized for detection and/or diagnosis of SARS-CoV-2 by FDA under an Emergency Use Authorization (EUA).  This EUA will remain in effect (meaning this test can be used) for the duration of the COVID-19 declaration under Section 564(b)(1) of the Act, 21 U.S.C. section 360bbb-3(b)(1), unless the authorization is terminated or revoked sooner. Performed at [facility], [address] [place], [place] [address]   MRSA PCR Screening     Status: None   Collection Time: 11/18/18 11:07 PM   Specimen: Nasopharyngeal  Result Value Ref Range Status   MRSA by PCR NEGATIVE NEGATIVE Final    Comment:        The GeneXpert MRSA Assay (FDA approved for NASAL specimens only), is one component of a comprehensive MRSA colonization surveillance program. It is not intended to diagnose MRSA infection nor to guide or monitor treatment for MRSA infections. Performed at [facility], [address], [place], [place] [address]     Radiology Reports Dg Chest Portable 1 View  Result Date: 11/18/2018 CLINICAL DATA:  Four-day history fatigue, dizziness and syncope. Hypotension and tachycardia upon emergency department arrival. EXAM: PORTABLE CHEST 1 VIEW COMPARISON:  05/08/2017 and earlier. FINDINGS: Prior sternotomy for aortic and mitral valve replacement. Prior  LEFT atrial appendage clipping. Cardiac silhouette upper normal in size, unchanged. Lungs clear. Bronchovascular markings normal. Pulmonary vascularity normal. No visible  pleural effusions. No pneumothorax. IMPRESSION: No acute cardiopulmonary disease. Electronically Signed   By: Hulan Saas M.D.   On: 11/18/2018 18:05    Time Spent in minutes  30   Susa Raring M.D on 11/25/2018 at 9:41 AM  To page go to [email or web address] - password [email or web address]

## 2018-11-25 NOTE — Progress Notes (Signed)
ANTICOAGULATION CONSULT NOTE  Pharmacy Consult for Heparin + Warfarin Indication: atrial fibrillation and valve replacement  No Known Allergies  Patient Measurements: Height: 5' 6.5" (168.9 cm) Weight: 178 lb 7.8 oz (81 kg) IBW/kg (Calculated) : 60.45 Heparin Dosing Weight: 76  Vital Signs: Temp: 98.9 F (37.2 C) (09/22 0522) Temp Source: Oral (09/22 0522) BP: 117/54 (09/22 0522) Pulse Rate: 73 (09/22 0522)  Labs: Recent Labs    11/23/18 0554 11/24/18 0617 11/25/18 0551  HGB 9.2* 9.5* 9.5*  HCT 28.1* 30.3* 30.1*  PLT 281 351 367  LABPROT 13.1 14.3 15.6*  INR 1.0 1.1 1.3*  HEPARINUNFRC 0.37 0.36 0.32    Estimated Creatinine Clearance: 81.1 mL/min (by C-G formula based on SCr of 0.72 mg/dL).  Assessment: 48 YOF presented with dark colored diarrhea, dizziness x 4 days, hypotension/syncope while using the bathroom.  Patient had severe anemia with INR > 10 that was reversed.  Pharmacy consulted to dose IV heparin for history of Afib and valve replacement. Warfarin resumed 9/19  Home Coumadin regimen 7.5 MWF, 5 mg STTS.   Heparin level this morning is therapeutic (HL 0.32 << 0.36, goal of 0.3-0.5). INR today remains SUBtherapeutic and will likely be slow to trend up due to Vit K administration on admission (INR 1.3 << 1.1, goal of 2.5-3.5).   Discussed with Dr. Candiss Norse and will transition the patient over to bridging with Lovenox today as not to impede discharge plans. The patient can be bridged outpatient with close follow-up at the [facility] clinic.     Goal of Therapy:  Anti-Xa level 0.6-1 units/ml 4hrs after LMWH dose given  INR 2.5-3.5 Monitor platelets by anticoagulation protocol: Yes   Plan:  - D/c Heparin - Start Lovenox 80 mg SQ every 12 hours (first dose ~1 hour after heparin d/ced) - Enox education kit ordered, consult to care management for evaluation of cost - Repeat Warfarin 10 mg x 1 dose at 1800 today - Will continue to monitor for any signs/symptoms of  bleeding and will follow up with heparin level and INR in the a.m.   Thank you for allowing pharmacy to be a part of this patient's care.  Alycia Rossetti, PharmD, BCPS Clinical Pharmacist 11/25/2018 7:56 AM   **Pharmacist phone directory can now be found on [email or web address] ([email or web address]).  Listed under [place].

## 2018-11-26 DIAGNOSIS — D62 Acute posthemorrhagic anemia: Secondary | ICD-10-CM

## 2018-11-26 DIAGNOSIS — Z952 Presence of prosthetic heart valve: Secondary | ICD-10-CM

## 2018-11-26 DIAGNOSIS — I480 Paroxysmal atrial fibrillation: Secondary | ICD-10-CM

## 2018-11-26 LAB — PROTIME-INR
INR: 1.6 — ABNORMAL HIGH (ref 0.8–1.2)
Prothrombin Time: 19.2 seconds — ABNORMAL HIGH (ref 11.4–15.2)

## 2018-11-26 LAB — CBC
HCT: 30.2 % — ABNORMAL LOW (ref 36.0–46.0)
Hemoglobin: 9.8 g/dL — ABNORMAL LOW (ref 12.0–15.0)
MCH: 31.4 pg (ref 26.0–34.0)
MCHC: 32.5 g/dL (ref 30.0–36.0)
MCV: 96.8 fL (ref 80.0–100.0)
Platelets: 381 10*3/uL (ref 150–400)
RBC: 3.12 MIL/uL — ABNORMAL LOW (ref 3.87–5.11)
RDW: 14.5 % (ref 11.5–15.5)
WBC: 5.9 10*3/uL (ref 4.0–10.5)
nRBC: 0 % (ref 0.0–0.2)

## 2018-11-26 MED ORDER — PANTOPRAZOLE SODIUM 40 MG PO TBEC: 40.0000 mg | DELAYED_RELEASE_TABLET | Freq: Two times a day (BID) | ORAL | 1 refills | Status: DC

## 2018-11-26 MED ORDER — WARFARIN SODIUM 5 MG PO TABS: 7.5000 mg | ORAL_TABLET | Freq: Once | ORAL | 0 refills | Status: DC

## 2018-11-26 MED ORDER — ENOXAPARIN SODIUM 80 MG/0.8ML ~~LOC~~ SOLN: 80.0000 mg | Freq: Two times a day (BID) | SUBCUTANEOUS | 0 refills | Status: DC

## 2018-11-26 MED FILL — PANTOPRAZOLE SOD DR 40 MG T: 40 | 30 days supply | Qty: 60 | Fill #0

## 2018-11-26 MED FILL — ENOXAPARIN SODIUM 80 MG/0.8: 80 | 5 days supply | Qty: 8 | Fill #0

## 2018-11-26 MED FILL — WARFARIN SODIUM 5 MG TABLET: 5 | 10 days supply | Qty: 15 | Fill #0

## 2018-11-26 NOTE — Progress Notes (Signed)
DISCHARGE NOTE HOME Sharon Figueroa to be discharged Home per MD order. Discussed prescriptions and follow up appointments with the patient. Prescriptions given to patient; medication list explained in detail. Patient verbalized understanding.  Skin clean, dry and intact without evidence of skin break down, no evidence of skin tears noted. IV catheter discontinued intact. Site without signs and symptoms of complications. Dressing and pressure applied. Pt denies pain at the site currently. No complaints noted.  Patient free of lines, drains, and wounds.   An After Visit Summary (AVS) was printed and given to the patient. Patient escorted via wheelchair, and discharged home via private auto.  Paulla Fore, RN

## 2018-11-26 NOTE — TOC Transition Note (Signed)
Transition of Care [facility]) - CM/SW Discharge Note   Patient Details  Name: Sharon Figueroa MRN: [id number] Date of Birth: [birth year]  [facility] of [facility]) CM/SW Contact:  Bartholomew Crews, RN Phone Number: [phone] 11/26/2018, 10:17 AM   Clinical Narrative:    Spoke with patient at the bedside about copay for lovenox injections. Patient agreeable. Patient would like to use [facility] pharmacy. MD notified. PTA home - independent - no other transition of care needs. Patient to transition home today.    Final next level of care: Home/Self Care Barriers to Discharge: No Barriers Identified   Patient Goals and CMS Choice Patient states their goals for this hospitalization and ongoing recovery are:: ready to go home CMS Medicare.gov Compare Post Acute Care list provided to:: Patient Choice offered to / list presented to : NA  Discharge Placement                       Discharge Plan and Services                DME Arranged: N/A DME Agency: NA       HH Arranged: NA HH Agency: NA        Social Determinants of Health (SDOH) Interventions     Readmission Risk Interventions No flowsheet data found.

## 2018-11-26 NOTE — Discharge Summary (Signed)
Physician Discharge Summary  Sharon Figueroa CZY:[id number] DOB: 22-Oct-1960 DOA: 11/18/2018  PCP: Sharon Figueroa  Admit date: 11/18/2018 Discharge date: 11/26/2018  Admitted From: Home Disposition: Home  Recommendations for Outpatient Follow-up:  1. Follow up with PCP with repeat CBC/BMP/INR at earliest convenience.  Coumadin to be dosed depending on the INR value.  For now, she will be discharged on Coumadin 7.5 mg daily. 2. Outpatient follow-up with GI and cardiology 3. 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  She was initially admitted to ICU, GI was consulted.  She underwent EGD and colonoscopy.  She was transferred to [facility] care on 11/22/2018.  She received 2 units of FFP and 2 units of packed red cells during the hospitalization along with IV vitamin K.  Monitor bleeding stopped, she was started on IV heparin along with Coumadin and subsequently switched to Lovenox with Coumadin.  Her INR is 1.6 today.  She will be discharged on Lovenox and Coumadin.  Discharge Diagnoses:   Acute blood loss anemia Acute most likely upper GI bleeding Syncope and hypotension secondary to above -Presented with acute upper GI bleeding causing acute blood loss anemia and syncope.  Required intravenous vitamin K, 2 units of FFP along with 2 units of packed red cells during the hospitalization initially in ICU.  Treated with intravenous Protonix. -Status post EGD and colonoscopy.  EGD revealed 4 mm nonbleeding gastric ulcer.  Colonoscopy was nonacute.  GI recommends PPI twice a day at least for 8 weeks then subsequently once a day.  Outpatient  follow-up with GI. -Currently hemoglobin stable. -Patient was started on IV heparin along with Coumadin which was subsequently switched to Lovenox with Coumadin.  INR is 1.6 today.  Patient was to go home today.  She will be discharged on Lovenox twice a day 80 mg subcu for at least 5 days along with Coumadin 7.5 mg daily.  She received Coumadin 10 mg each for the last 2 days.  Outpatient follow-up of INR at earliest convenience with follow-up with PCP regarding Coumadin dosing.  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 fib -Goal INR is 2.5-3.5. -Patient can follow-up with Dr. Jens Som as an outpatient and resume aspirin if needed at that time. -Anticoagulation plan as above -Continue metoprolol  Discharge Instructions  Discharge Instructions    Ambulatory referral to Cardiology   Complete by: As directed    Diet - low sodium heart healthy   Complete by: As directed    Increase activity slowly   Complete by: As directed      Allergies as of 11/26/2018   No Known Allergies     Medication List    STOP taking these medications   aspirin EC 81 MG tablet     TAKE these medications   acetaminophen 325 MG tablet Commonly known as: TYLENOL Take 2 tablets (650 mg total) by mouth every 6 (six) hours as needed for mild pain.   enoxaparin 80 MG/0.8ML injection Commonly known as: LOVENOX Inject 0.8 mLs (80 mg total) into the skin every 12 (twelve) hours for 5 days.   metoprolol succinate 25 MG 24 hr tablet Commonly known as:  TOPROL-XL Take 0.5 tablets (12.5 mg total) by mouth daily. MUST KEEP APPOINTMENT 01/05/19 WITH DR Jens Som FOR FUTURE REFILLS What changed: additional instructions   pantoprazole 40 MG tablet Commonly known as: Protonix Take 1 tablet (40 mg total) by mouth 2 (two) times daily before a meal.   warfarin 5 MG tablet Commonly known as: COUMADIN Take as directed. If you are unsure how to take this  medication, talk to your nurse or doctor. Original instructions: Take 1.5 tablets (7.5 mg total) by mouth one time only at 6 PM for 10 doses. Follow up with Coumadin clinic to adjust dose What changed:   how much to take  how to take this  when to take this  additional instructions      Follow-up Information    Lewayne Bunting, MD. Schedule an appointment as soon as possible for a visit in 1 week(s).   Specialty: Cardiology Why: with repeat CBC/BMP/INR at earliest convenience Contact information: [address] [place] [address] [phone]        Kerin Salen, MD. Schedule an appointment as soon as possible for a visit in 2 week(s).   Specialty: Gastroenterology Contact information: [address] [place] [address] [phone]          No Known Allergies  Consultations:  GI/PCCM/cardiology   Procedures/Studies: Dg Chest Portable 1 View  Result Date: 11/18/2018 CLINICAL DATA:  Four-day history fatigue, dizziness and syncope. Hypotension and tachycardia upon emergency department arrival. EXAM: PORTABLE CHEST 1 VIEW COMPARISON:  05/08/2017 and earlier. FINDINGS: Prior sternotomy for aortic and mitral valve replacement. Prior LEFT atrial appendage clipping. Cardiac silhouette upper normal in size, unchanged. Lungs clear. Bronchovascular markings normal. Pulmonary vascularity normal. No visible pleural effusions. No pneumothorax. IMPRESSION: No acute cardiopulmonary disease. Electronically Signed   By: Hulan Saas M.D.   On: 11/18/2018 18:05   9/17 EGD>>- 4 mm non-bleeding gastric ulcer with pigmented material, treated with bipolar cautery and erythematous mucosa in the antrum.  Colonoscopy 9/18>>polyps present, removed. No other source or stigmata of bleeding noted.    Subjective: Patient seen and examined at bedside.  She feels better and wants to go home.  No overnight fever or vomiting or black or bloody  stools.  Discharge Exam: Vitals:   11/26/18 0452 11/26/18 0900  BP: (!) 98/53 116/63  Pulse: 67 76  Resp: 18 18  Temp: 98.1 F (36.7 C) 97.6 F (36.4 C)  SpO2: 94% 100%    General: Pt is alert, awake, not in acute distress Cardiovascular: rate controlled, S1/S2 + Respiratory: bilateral decreased breath sounds at bases Abdominal: Soft, NT, ND, bowel sounds + Extremities: no edema, no cyanosis    The results of significant diagnostics from this hospitalization (including imaging, microbiology, ancillary and laboratory) are listed below for reference.     Microbiology: Recent Results (from the past 240 hour(s))  SARS Coronavirus 2 [facility] order, Performed in [facility] hospital lab) Nasopharyngeal Nasopharyngeal Swab     Status: None   Collection Time: 11/18/18  6:30 PM   Specimen: Nasopharyngeal Swab  Result Value Ref Range Status   SARS Coronavirus 2 NEGATIVE NEGATIVE Final    Comment: (NOTE) If result is NEGATIVE SARS-CoV-2 target nucleic acids are NOT DETECTED. The SARS-CoV-2 RNA is generally detectable in upper and lower  respiratory specimens during the acute phase of infection. The lowest  concentration of SARS-CoV-2 viral copies this assay can detect is 250  copies / mL. A negative result does  not preclude SARS-CoV-2 infection  and should not be used as the sole basis for treatment or other  patient management decisions.  A negative result may occur with  improper specimen collection / handling, submission of specimen other  than nasopharyngeal swab, presence of viral mutation(s) within the  areas targeted by this assay, and inadequate number of viral copies  (<250 copies / mL). A negative result must be combined with clinical  observations, patient history, and epidemiological information. If result is POSITIVE SARS-CoV-2 target nucleic acids are DETECTED. The SARS-CoV-2 RNA is generally detectable in upper and lower  respiratory specimens dur ing the acute  phase of infection.  Positive  results are indicative of active infection with SARS-CoV-2.  Clinical  correlation with patient history and other diagnostic information is  necessary to determine patient infection status.  Positive results do  not rule out bacterial infection or co-infection with other viruses. If result is PRESUMPTIVE POSTIVE SARS-CoV-2 nucleic acids MAY BE PRESENT.   A presumptive positive result was obtained on the submitted specimen  and confirmed on repeat testing.  While 2019 novel coronavirus  (SARS-CoV-2) nucleic acids may be present in the submitted sample  additional confirmatory testing may be necessary for epidemiological  and / or clinical management purposes  to differentiate between  SARS-CoV-2 and other Sarbecovirus currently known to infect humans.  If clinically indicated additional testing with an alternate test  methodology [id number]) is advised. The SARS-CoV-2 RNA is generally  detectable in upper and lower respiratory sp ecimens during the acute  phase of infection. The expected result is Negative. Fact Sheet for Patients:  StrictlyIdeas.no Fact Sheet for Healthcare Providers: BankingDealers.co.za This test is not yet approved or cleared by the [place] FDA and has been authorized for detection and/or diagnosis of SARS-CoV-2 by FDA under an Emergency Use Authorization (EUA).  This EUA will remain in effect (meaning this test can be used) for the duration of the COVID-19 declaration under Section 564(b)(1) of the Act, 21 U.S.C. section 360bbb-3(b)(1), unless the authorization is terminated or revoked sooner. Performed at [facility], [place] [address] [place], [place] [address]   MRSA PCR Screening     Status: None   Collection Time: 11/18/18 11:07 PM   Specimen: Nasopharyngeal  Result Value Ref Range Status   MRSA by PCR NEGATIVE NEGATIVE Final    Comment:        The GeneXpert MRSA Assay  (FDA approved for NASAL specimens only), is one component of a comprehensive MRSA colonization surveillance program. It is not intended to diagnose MRSA infection nor to guide or monitor treatment for MRSA infections. Performed at [facility], [place] [address], [place], [place] [address]      Labs: BNP (last 3 results) Recent Labs    11/18/18 1732  BNP 947.0*   Basic Metabolic Panel: Recent Labs  Lab 11/19/18 1836 11/20/18 0801 11/21/18 0314 11/22/18 0525  NA 138 137 139 141  K 3.7 3.5 3.2* 3.7  CL 106 104 107 108  CO2 25 25 25 26   GLUCOSE 104* 101* 100* 99  BUN 15 10 5* 6  CREATININE 0.61 0.61 0.62 0.72  CALCIUM 7.9* 7.5* 8.0* 8.0*  MG 2.2 2.2 2.2  --   PHOS 1.6* 2.5 2.1* 4.1   Liver Function Tests: No results for input(s): AST, ALT, ALKPHOS, BILITOT, PROT, ALBUMIN in the last 168 hours. No results for input(s): LIPASE, AMYLASE in the last 168 hours. No results for input(s): AMMONIA  in the last 168 hours. CBC: Recent Labs  Lab 11/21/18 0314  11/22/18 1724 11/23/18 0554 11/24/18 0617 11/25/18 0551 11/26/18 0741  WBC 10.1  --   --  7.0 6.2 7.4 5.9  HGB 9.0*   < > 9.7* 9.2* 9.5* 9.5* 9.8*  HCT 26.2*   < > 29.3* 28.1* 30.3* 30.1* 30.2*  MCV 93.6  --   --  96.6 97.1 97.4 96.8  PLT 210  --   --  281 351 367 381   < > = values in this interval not displayed.   Cardiac Enzymes: No results for input(s): CKTOTAL, CKMB, CKMBINDEX, TROPONINI in the last 168 hours. BNP: Invalid input(s): POCBNP CBG: Recent Labs  Lab 11/20/18 1945 11/20/18 2341 11/21/18 0328 11/21/18 0743 11/21/18 1156  GLUCAP 85 97 89 88 108*   D-Dimer No results for input(s): DDIMER in the last 72 hours. Hgb A1c No results for input(s): HGBA1C in the last 72 hours. Lipid Profile No results for input(s): CHOL, HDL, LDLCALC, TRIG, CHOLHDL, LDLDIRECT in the last 72 hours. Thyroid function studies No results for input(s): TSH, T4TOTAL, T3FREE, THYROIDAB in the last 72  hours.  Invalid input(s): FREET3 Anemia work up No results for input(s): VITAMINB12, FOLATE, FERRITIN, TIBC, IRON, RETICCTPCT in the last 72 hours. Urinalysis No results found for: COLORURINE, APPEARANCEUR, LABSPEC, PHURINE, GLUCOSEU, HGBUR, BILIRUBINUR, KETONESUR, PROTEINUR, UROBILINOGEN, NITRITE, LEUKOCYTESUR Sepsis Labs Invalid input(s): PROCALCITONIN,  WBC,  LACTICIDVEN Microbiology Recent Results (from the past 240 hour(s))  SARS Coronavirus 2 [facility] order, Performed in [facility] hospital lab) Nasopharyngeal Nasopharyngeal Swab     Status: None   Collection Time: 11/18/18  6:30 PM   Specimen: Nasopharyngeal Swab  Result Value Ref Range Status   SARS Coronavirus 2 NEGATIVE NEGATIVE Final    Comment: (NOTE) If result is NEGATIVE SARS-CoV-2 target nucleic acids are NOT DETECTED. The SARS-CoV-2 RNA is generally detectable in upper and lower  respiratory specimens during the acute phase of infection. The lowest  concentration of SARS-CoV-2 viral copies this assay can detect is 250  copies / mL. A negative result does not preclude SARS-CoV-2 infection  and should not be used as the sole basis for treatment or other  patient management decisions.  A negative result may occur with  improper specimen collection / handling, submission of specimen other  than nasopharyngeal swab, presence of viral mutation(s) within the  areas targeted by this assay, and inadequate number of viral copies  (<250 copies / mL). A negative result must be combined with clinical  observations, patient history, and epidemiological information. If result is POSITIVE SARS-CoV-2 target nucleic acids are DETECTED. The SARS-CoV-2 RNA is generally detectable in upper and lower  respiratory specimens dur ing the acute phase of infection.  Positive  results are indicative of active infection with SARS-CoV-2.  Clinical  correlation with patient history and other diagnostic information is  necessary to determine  patient infection status.  Positive results do  not rule out bacterial infection or co-infection with other viruses. If result is PRESUMPTIVE POSTIVE SARS-CoV-2 nucleic acids MAY BE PRESENT.   A presumptive positive result was obtained on the submitted specimen  and confirmed on repeat testing.  While 2019 novel coronavirus  (SARS-CoV-2) nucleic acids may be present in the submitted sample  additional confirmatory testing may be necessary for epidemiological  and / or clinical management purposes  to differentiate between  SARS-CoV-2 and other Sarbecovirus currently known to infect humans.  If clinically indicated additional testing with an  alternate test  methodology [phone]([id number]) is advised. The SARS-CoV-2 RNA is generally  detectable in upper and lower respiratory sp ecimens during the acute  phase of infection. The expected result is Negative. Fact Sheet for Patients:  BoilerBrush.com.cyhttps://www.fda.gov/media/136312/download Fact Sheet for Healthcare Providers: https://pope.com/https://www.fda.gov/media/136313/download This test is not yet approved or cleared by the [place] FDA and has been authorized for detection and/or diagnosis of SARS-CoV-2 by FDA under an Emergency Use Authorization (EUA).  This EUA will remain in effect (meaning this test can be used) for the duration of the COVID-19 declaration under Section 564(b)(1) of the Act, 21 U.S.C. section 360bbb-3(b)(1), unless the authorization is terminated or revoked sooner. Performed at [facility], [address] [place], [place] [address]   MRSA PCR Screening     Status: None   Collection Time: 11/18/18 11:07 PM   Specimen: Nasopharyngeal  Result Value Ref Range Status   MRSA by PCR NEGATIVE NEGATIVE Final    Comment:        The GeneXpert MRSA Assay (FDA approved for NASAL specimens only), is one component of a comprehensive MRSA colonization surveillance program. It is not intended to diagnose MRSA infection nor to guide or monitor  treatment for MRSA infections. Performed at [facility], [address] [place], [place] [address]      Time coordinating discharge: 35 minutes  SIGNED:   Glade LloydKshitiz Lakeshia Dohner, MD  [facility] 11/26/2018, 10:15 AM

## 2018-11-27 ENCOUNTER — Other Ambulatory Visit: Payer: Self-pay | Admitting: Cardiology

## 2018-12-01 ENCOUNTER — Other Ambulatory Visit: Payer: Self-pay

## 2018-12-01 ENCOUNTER — Ambulatory Visit (INDEPENDENT_AMBULATORY_CARE_PROVIDER_SITE_OTHER): Payer: BC Managed Care – PPO | Admitting: Pharmacist Clinician (PhC)/ Clinical Pharmacy Specialist

## 2018-12-01 DIAGNOSIS — I050 Rheumatic mitral stenosis: Secondary | ICD-10-CM

## 2018-12-01 DIAGNOSIS — I4892 Unspecified atrial flutter: Secondary | ICD-10-CM

## 2018-12-01 DIAGNOSIS — I4891 Unspecified atrial fibrillation: Secondary | ICD-10-CM

## 2018-12-01 DIAGNOSIS — I480 Paroxysmal atrial fibrillation: Secondary | ICD-10-CM | POA: Diagnosis not present

## 2018-12-01 DIAGNOSIS — Z952 Presence of prosthetic heart valve: Secondary | ICD-10-CM

## 2018-12-01 DIAGNOSIS — Z5181 Encounter for therapeutic drug level monitoring: Secondary | ICD-10-CM | POA: Diagnosis not present

## 2018-12-01 LAB — POCT INR: INR: 3.3 — AB (ref 2.0–3.0)

## 2018-12-01 NOTE — Progress Notes (Signed)
Cbc

## 2018-12-02 ENCOUNTER — Encounter: Payer: Self-pay | Admitting: *Deleted

## 2018-12-02 LAB — BASIC METABOLIC PANEL
BUN/Creatinine Ratio: 15 (ref 12–28)
BUN: 11 mg/dL (ref 8–27)
CO2: 22 mmol/L (ref 20–29)
Calcium: 8.9 mg/dL (ref 8.7–10.3)
Chloride: 104 mmol/L (ref 96–106)
Creatinine, Ser: 0.75 mg/dL (ref 0.57–1.00)
GFR calc Af Amer: 100 mL/min/{1.73_m2} (ref >59)
GFR calc non Af Amer: 87 mL/min/{1.73_m2} (ref >59)
Glucose: 130 mg/dL — ABNORMAL HIGH (ref 65–99)
Potassium: 4.3 mmol/L (ref 3.5–5.2)
Sodium: 139 mmol/L (ref 134–144)

## 2018-12-02 LAB — CBC WITH DIFFERENTIAL/PLATELET
Basophils Absolute: 0.1 10*3/uL (ref 0.0–0.2)
Basos: 1 %
EOS (ABSOLUTE): 0 10*3/uL (ref 0.0–0.4)
Eos: 0 %
Hematocrit: 30.8 % — ABNORMAL LOW (ref 34.0–46.6)
Hemoglobin: 10 g/dL — ABNORMAL LOW (ref 11.1–15.9)
Immature Grans (Abs): 0 10*3/uL (ref 0.0–0.1)
Immature Granulocytes: 1 %
Lymphocytes Absolute: 1.3 10*3/uL (ref 0.7–3.1)
Lymphs: 18 %
MCH: 30.8 pg (ref 26.6–33.0)
MCHC: 32.5 g/dL (ref 31.5–35.7)
MCV: 95 fL (ref 79–97)
Monocytes Absolute: 0.5 10*3/uL (ref 0.1–0.9)
Monocytes: 7 %
Neutrophils Absolute: 5.6 10*3/uL (ref 1.4–7.0)
Neutrophils: 73 %
Platelets: 407 10*3/uL (ref 150–450)
RBC: 3.25 x10E6/uL — ABNORMAL LOW (ref 3.77–5.28)
RDW: 13.2 % (ref 11.7–15.4)
WBC: 7.6 10*3/uL (ref 3.4–10.8)

## 2018-12-08 ENCOUNTER — Ambulatory Visit (INDEPENDENT_AMBULATORY_CARE_PROVIDER_SITE_OTHER): Payer: BC Managed Care – PPO | Admitting: Pharmacist

## 2018-12-08 ENCOUNTER — Other Ambulatory Visit: Payer: Self-pay

## 2018-12-08 DIAGNOSIS — I4892 Unspecified atrial flutter: Secondary | ICD-10-CM

## 2018-12-08 DIAGNOSIS — I480 Paroxysmal atrial fibrillation: Secondary | ICD-10-CM | POA: Diagnosis not present

## 2018-12-08 DIAGNOSIS — I4891 Unspecified atrial fibrillation: Secondary | ICD-10-CM | POA: Diagnosis not present

## 2018-12-08 DIAGNOSIS — Z5181 Encounter for therapeutic drug level monitoring: Secondary | ICD-10-CM | POA: Diagnosis not present

## 2018-12-08 DIAGNOSIS — I050 Rheumatic mitral stenosis: Secondary | ICD-10-CM

## 2018-12-08 DIAGNOSIS — Z952 Presence of prosthetic heart valve: Secondary | ICD-10-CM | POA: Diagnosis not present

## 2018-12-08 LAB — POCT INR: INR: 2.9 (ref 2.0–3.0)

## 2018-12-12 ENCOUNTER — Telehealth: Payer: Self-pay | Admitting: Cardiology

## 2018-12-12 NOTE — Telephone Encounter (Signed)
Patient missed her dosage of coumadin last night and wanted to know if she should take the dose she missed now, or just wait until her regular time tonight.  She was recently hospitalized for her INR and bleeding and does nto want to mess up the dosage of her medicine.

## 2018-12-12 NOTE — Telephone Encounter (Signed)
Pt was advised to take extra 1/2 tablet tonight then continue with normal schedule.  Pt voiced understanding

## 2018-12-19 ENCOUNTER — Other Ambulatory Visit: Payer: Self-pay

## 2018-12-19 ENCOUNTER — Ambulatory Visit (INDEPENDENT_AMBULATORY_CARE_PROVIDER_SITE_OTHER): Payer: BC Managed Care – PPO | Admitting: Pharmacist Clinician (PhC)/ Clinical Pharmacy Specialist

## 2018-12-19 DIAGNOSIS — Z952 Presence of prosthetic heart valve: Secondary | ICD-10-CM | POA: Diagnosis not present

## 2018-12-19 DIAGNOSIS — I050 Rheumatic mitral stenosis: Secondary | ICD-10-CM

## 2018-12-19 DIAGNOSIS — I480 Paroxysmal atrial fibrillation: Secondary | ICD-10-CM

## 2018-12-19 DIAGNOSIS — I4892 Unspecified atrial flutter: Secondary | ICD-10-CM | POA: Diagnosis not present

## 2018-12-19 DIAGNOSIS — Z5181 Encounter for therapeutic drug level monitoring: Secondary | ICD-10-CM

## 2018-12-19 LAB — POCT INR: INR: 3.4 — AB (ref 2.0–3.0)

## 2019-01-01 NOTE — Progress Notes (Signed)
HPI: Follow-up atrial fibrillation andMVR/AVR. Patient admitted January 2019 with new onset atrial fibrillation and congestive heart failure. 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 Preoperative cardiac catheterization showed normal coronary arteries. Preoperative carotid Dopplers showed no stenosis. Patient subsequently underwent aortic valve replacement and mitral valve replacement with mechanical valves and a Maze procedure with ligation of left atrial appendage. Echocardiogram September 2020 showed ejection fraction 45 to 50%, severe left atrial enlargement, status post mitral valve replacement with mean gradient 7 mmHg, prior aortic valve replacement with mean gradient 21 mmHg and trace aortic insufficiency.  GI bleed September 2020 and found to have ulcer and polyps.  Since last seen,patient has dyspnea with more vigorous activities but not routine activities.  No orthopnea, PND, pedal edema, chest pain, syncope or bleeding.  Current Outpatient Medications  Medication Sig Dispense Refill  . acetaminophen (TYLENOL) 325 MG tablet Take 2 tablets (650 mg total) by mouth every 6 (six) hours as needed for mild pain.    . pantoprazole (PROTONIX) 40 MG tablet Take 1 tablet (40 mg total) by mouth 2 (two) times daily before a meal. 60 tablet 1  . warfarin (COUMADIN) 5 MG tablet TAKE 1-2 TABLETS BY MOUTH DAILY OR AS DIRECTED BY COUMADIN CLINIC 40 tablet 5  . metoprolol succinate (TOPROL-XL) 25 MG 24 hr tablet Take 0.5 tablets (12.5 mg total) by mouth daily. MUST KEEP APPOINTMENT 01/05/19 WITH DR Stanford Breed FOR FUTURE REFILLS (Patient not taking: Reported on 01/05/2019) 45 tablet 1   No current facility-administered medications for this visit.      Past Medical History:  Diagnosis Date  . 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 AORTIC VALVE REPLACEMENT N/A 04/04/2017   Procedure: AORTIC VALVE REPLACEMENT (AVR);  Surgeon: Gaye Pollack, MD;  Location: [place];  Service: Open Heart Surgery;  Laterality: N/A;  . BIOPSY  11/21/2018   Procedure: BIOPSY;  Surgeon: [name], MD;  Location: [place];  Service: Gastroenterology;;  . CARDIAC SURGERY    . COLONOSCOPY WITH PROPOFOL N/A 11/21/2018   Procedure: COLONOSCOPY WITH PROPOFOL;  Surgeon: [name], MD;  Location: [place];  Service: Gastroenterology;  Laterality: N/A;  . ESOPHAGOGASTRODUODENOSCOPY N/A 11/20/2018   Procedure: ESOPHAGOGASTRODUODENOSCOPY (EGD);  Surgeon: [name], MD;  Location: [place];  Service: Gastroenterology;  Laterality: N/A;  . HOT HEMOSTASIS N/A 11/20/2018   Procedure: HOT HEMOSTASIS (ARGON PLASMA COAGULATION/BICAP);  Surgeon: [name], MD;  Location: [place];  Service: Gastroenterology;  Laterality: N/A;  . MAZE N/A 04/04/2017   Procedure: MAZE;  Surgeon: Gaye Pollack, MD;  Location: [place];  Service: Open Heart Surgery;  Laterality: N/A;  . MITRAL VALVE REPLACEMENT N/A 04/04/2017   Procedure: MITRAL VALVE (MV) REPLACEMENT;  Surgeon: Gaye Pollack, MD;  Location: [place];  Service: Open Heart Surgery;  Laterality: N/A;  . No prior surgery    . POLYPECTOMY  11/21/2018   Procedure: POLYPECTOMY;  Surgeon: [name], MD;  Location: [facility] ENDOSCOPY;  Service: Gastroenterology;;  . RIGHT/LEFT HEART CATH AND CORONARY ANGIOGRAPHY N/A 04/01/2017   Procedure: RIGHT/LEFT HEART CATH AND CORONARY ANGIOGRAPHY;  Surgeon: Lorretta Harp, MD;  Location: [place] CV LAB;  Service: Cardiovascular;  Laterality: N/A;  . TEE WITHOUT CARDIOVERSION N/A 04/04/2017   Procedure: TRANSESOPHAGEAL ECHOCARDIOGRAM (TEE);  Surgeon: Alleen Borne, MD;  Location: [facility] OR;  Service: Open Heart Surgery;  Laterality: N/A;    Social History   Socioeconomic  History  . Marital status: Married    Spouse name: Not on file  . Number of children: 3  . Years of education: Not on file  . Highest education level: Not on file  Occupational History  . Not on file  Social Needs  . Financial resource strain: Not on file  . Food insecurity    Worry: Not on file    Inability: Not on file  . Transportation needs    Medical: Not on file    Non-medical: Not on file  Tobacco Use  . Smoking status: Former Smoker    Packs/day: 1.00    Types: Cigarettes    Quit date: 03/30/2017    Years since quitting: 1.7  . Smokeless tobacco: Never Used  Substance and Sexual Activity  . Alcohol use: No    Frequency: Never  . Drug use: No  . Sexual activity: Not on file  Lifestyle  . Physical activity    Days per week: Not on file    Minutes per session: Not on file  . Stress: Not on file  Relationships  . Social Musician on phone: Not on file    Gets together: Not on file    Attends religious service: Not on file    Active member of club or organization: Not on file    Attends meetings of clubs or organizations: Not on file    Relationship status: Not on file  . Intimate partner violence    Fear of current or ex partner: Not on file    Emotionally abused: Not on file    Physically abused: Not on file    Forced sexual activity: Not on file  Other Topics Concern  . Not on file  Social History Narrative  . Not on file    Family History  Problem Relation Age of Onset  . CVA Father   . Heart attack Father     ROS: no fevers or chills, productive cough, hemoptysis, dysphasia, odynophagia, melena, hematochezia, dysuria, hematuria, rash, seizure activity, orthopnea, PND, pedal edema, claudication. Remaining systems are negative.  Physical Exam: Well-developed well-nourished in no acute distress.  Skin is warm and dry.  HEENT is normal.  Neck is supple.  Chest is clear to auscultation with normal expansion.  Cardiovascular exam is  regular rate and rhythm.  Crisp mechanical valve sound Abdominal exam nontender or distended. No masses palpated. Extremities show no edema. neuro grossly intact  ECG-sinus rhythm with occasional PVC, normal axis, no ST changes.  Personally reviewed  A/P  1 status post aortic and mitral valve replacement-plan to continue Coumadin.  Resume aspirin 81 mg daily.  Continue SBE prophylaxis.  Recheck hemoglobin.  2 history of paroxysmal atrial fibrillation-patient had Maze procedure at time of surgery.  Continue beta-blocker.  She is on Coumadin for her valves.  3 chronic combined systolic/diastolic congestive heart failure-patient is doing well from a symptomatic standpoint.  Continue fluid restriction and low-sodium diet.  Olga Millers, MD

## 2019-01-05 ENCOUNTER — Ambulatory Visit (INDEPENDENT_AMBULATORY_CARE_PROVIDER_SITE_OTHER): Payer: BC Managed Care – PPO | Admitting: Pharmacist Clinician (PhC)/ Clinical Pharmacy Specialist

## 2019-01-05 ENCOUNTER — Encounter: Payer: Self-pay | Admitting: Cardiology

## 2019-01-05 ENCOUNTER — Ambulatory Visit (INDEPENDENT_AMBULATORY_CARE_PROVIDER_SITE_OTHER): Payer: BC Managed Care – PPO | Admitting: Cardiology

## 2019-01-05 ENCOUNTER — Other Ambulatory Visit: Payer: Self-pay

## 2019-01-05 VITALS — BP 119/72 | HR 90 | Temp 97.9°F | Ht 66.5 in | Wt 177.0 lb

## 2019-01-05 DIAGNOSIS — I5032 Chronic diastolic (congestive) heart failure: Secondary | ICD-10-CM

## 2019-01-05 DIAGNOSIS — Z952 Presence of prosthetic heart valve: Secondary | ICD-10-CM

## 2019-01-05 DIAGNOSIS — Z5181 Encounter for therapeutic drug level monitoring: Secondary | ICD-10-CM

## 2019-01-05 DIAGNOSIS — I4892 Unspecified atrial flutter: Secondary | ICD-10-CM

## 2019-01-05 DIAGNOSIS — I480 Paroxysmal atrial fibrillation: Secondary | ICD-10-CM

## 2019-01-05 DIAGNOSIS — I4891 Unspecified atrial fibrillation: Secondary | ICD-10-CM

## 2019-01-05 DIAGNOSIS — I050 Rheumatic mitral stenosis: Secondary | ICD-10-CM

## 2019-01-05 LAB — CBC
Hematocrit: 35.9 % (ref 34.0–46.6)
Hemoglobin: 12 g/dL (ref 11.1–15.9)
MCH: 30.2 pg (ref 26.6–33.0)
MCHC: 33.4 g/dL (ref 31.5–35.7)
MCV: 90 fL (ref 79–97)
Platelets: 316 10*3/uL (ref 150–450)
RBC: 3.98 x10E6/uL (ref 3.77–5.28)
RDW: 12.7 % (ref 11.7–15.4)
WBC: 5.5 10*3/uL (ref 3.4–10.8)

## 2019-01-05 LAB — POCT INR: INR: 4.3 — AB (ref 2.0–3.0)

## 2019-01-05 MED ORDER — ASPIRIN EC 81 MG PO TBEC: 81.0000 mg | DELAYED_RELEASE_TABLET | Freq: Every day | ORAL | 3 refills | Status: AC

## 2019-01-05 MED ORDER — METOPROLOL SUCCINATE ER 25 MG PO TB24: 12.5000 mg | ORAL_TABLET | Freq: Every day | ORAL | 3 refills | Status: DC

## 2019-01-05 NOTE — Addendum Note (Signed)
Addended by: Cristopher Estimable on: 01/05/2019 08:44 AM   Modules accepted: Orders

## 2019-01-05 NOTE — Patient Instructions (Addendum)
Medication Instructions:  START ASPIRIN 81 MG ONCE DAILY *If you need a refill on your cardiac medications before your next appointment, please call your pharmacy*  Lab Work: Your physician recommends that you HAVE LAB WORK TODAY If you have labs (blood work) drawn today and your tests are completely normal, you will receive your results only by: Marland Kitchen MyChart Message (if you have MyChart) OR . A paper copy in the mail If you have any lab test that is abnormal or we need to change your treatment, we will call you to review the results.  Follow-Up: At [facility], you and your health needs are our priority.  As part of our continuing mission to provide you with exceptional heart care, we have created designated Provider Care Teams.  These Care Teams include your primary Cardiologist (physician) and Advanced Practice Providers (APPs -  Physician Assistants and Nurse Practitioners) who all work together to provide you with the care you need, when you need it.  Your next appointment:   6 months  The format for your next appointment:   In Person  Provider:   Kirk Ruths, MD

## 2019-01-05 NOTE — Addendum Note (Signed)
Addended by: Cristopher Estimable on: 01/05/2019 08:46 AM   Modules accepted: Orders

## 2019-01-06 ENCOUNTER — Encounter: Payer: Self-pay | Admitting: *Deleted

## 2019-01-16 ENCOUNTER — Other Ambulatory Visit: Payer: Self-pay

## 2019-01-16 ENCOUNTER — Ambulatory Visit (INDEPENDENT_AMBULATORY_CARE_PROVIDER_SITE_OTHER): Payer: BC Managed Care – PPO | Admitting: Pharmacist

## 2019-01-16 DIAGNOSIS — Z5181 Encounter for therapeutic drug level monitoring: Secondary | ICD-10-CM | POA: Diagnosis not present

## 2019-01-16 DIAGNOSIS — I4892 Unspecified atrial flutter: Secondary | ICD-10-CM

## 2019-01-16 DIAGNOSIS — I050 Rheumatic mitral stenosis: Secondary | ICD-10-CM

## 2019-01-16 DIAGNOSIS — Z952 Presence of prosthetic heart valve: Secondary | ICD-10-CM | POA: Diagnosis not present

## 2019-01-16 DIAGNOSIS — I480 Paroxysmal atrial fibrillation: Secondary | ICD-10-CM | POA: Diagnosis not present

## 2019-01-16 LAB — POCT INR: INR: 3.1 — AB (ref 2.0–3.0)

## 2019-02-06 ENCOUNTER — Ambulatory Visit (INDEPENDENT_AMBULATORY_CARE_PROVIDER_SITE_OTHER): Payer: BC Managed Care – PPO | Admitting: Pharmacist

## 2019-02-06 ENCOUNTER — Other Ambulatory Visit: Payer: Self-pay

## 2019-02-06 DIAGNOSIS — I4892 Unspecified atrial flutter: Secondary | ICD-10-CM

## 2019-02-06 DIAGNOSIS — Z952 Presence of prosthetic heart valve: Secondary | ICD-10-CM

## 2019-02-06 DIAGNOSIS — I480 Paroxysmal atrial fibrillation: Secondary | ICD-10-CM | POA: Diagnosis not present

## 2019-02-06 DIAGNOSIS — Z5181 Encounter for therapeutic drug level monitoring: Secondary | ICD-10-CM

## 2019-02-06 DIAGNOSIS — I050 Rheumatic mitral stenosis: Secondary | ICD-10-CM

## 2019-02-06 LAB — POCT INR: INR: 4.3 — AB (ref 2.0–3.0)

## 2019-02-19 ENCOUNTER — Encounter (INDEPENDENT_AMBULATORY_CARE_PROVIDER_SITE_OTHER): Payer: Self-pay

## 2019-02-19 ENCOUNTER — Ambulatory Visit (INDEPENDENT_AMBULATORY_CARE_PROVIDER_SITE_OTHER): Payer: BC Managed Care – PPO | Admitting: Pharmacist Clinician (PhC)/ Clinical Pharmacy Specialist

## 2019-02-19 ENCOUNTER — Other Ambulatory Visit: Payer: Self-pay

## 2019-02-19 DIAGNOSIS — I480 Paroxysmal atrial fibrillation: Secondary | ICD-10-CM

## 2019-02-19 DIAGNOSIS — Z5181 Encounter for therapeutic drug level monitoring: Secondary | ICD-10-CM | POA: Diagnosis not present

## 2019-02-19 DIAGNOSIS — I4892 Unspecified atrial flutter: Secondary | ICD-10-CM

## 2019-02-19 DIAGNOSIS — I050 Rheumatic mitral stenosis: Secondary | ICD-10-CM | POA: Diagnosis not present

## 2019-02-19 DIAGNOSIS — Z952 Presence of prosthetic heart valve: Secondary | ICD-10-CM | POA: Diagnosis not present

## 2019-02-19 LAB — POCT INR: INR: 3.2 — AB (ref 2.0–3.0)

## 2019-02-23 DIAGNOSIS — L719 Rosacea, unspecified: Secondary | ICD-10-CM | POA: Diagnosis not present

## 2019-03-27 ENCOUNTER — Encounter: Payer: Self-pay | Admitting: Pharmacist Clinician (PhC)/ Clinical Pharmacy Specialist

## 2019-03-27 ENCOUNTER — Other Ambulatory Visit: Payer: Self-pay

## 2019-03-27 ENCOUNTER — Encounter (INDEPENDENT_AMBULATORY_CARE_PROVIDER_SITE_OTHER): Payer: Self-pay

## 2019-03-27 ENCOUNTER — Ambulatory Visit (INDEPENDENT_AMBULATORY_CARE_PROVIDER_SITE_OTHER): Payer: BC Managed Care – PPO | Admitting: Pharmacist Clinician (PhC)/ Clinical Pharmacy Specialist

## 2019-03-27 DIAGNOSIS — I480 Paroxysmal atrial fibrillation: Secondary | ICD-10-CM | POA: Diagnosis not present

## 2019-03-27 DIAGNOSIS — Z5181 Encounter for therapeutic drug level monitoring: Secondary | ICD-10-CM | POA: Diagnosis not present

## 2019-03-27 DIAGNOSIS — I4892 Unspecified atrial flutter: Secondary | ICD-10-CM

## 2019-03-27 DIAGNOSIS — Z952 Presence of prosthetic heart valve: Secondary | ICD-10-CM

## 2019-03-27 DIAGNOSIS — I050 Rheumatic mitral stenosis: Secondary | ICD-10-CM

## 2019-03-27 LAB — POCT INR: INR: 2.9 (ref 2.0–3.0)

## 2019-05-08 ENCOUNTER — Ambulatory Visit (INDEPENDENT_AMBULATORY_CARE_PROVIDER_SITE_OTHER): Payer: BC Managed Care – PPO | Admitting: Pharmacist Clinician (PhC)/ Clinical Pharmacy Specialist

## 2019-05-08 ENCOUNTER — Other Ambulatory Visit: Payer: Self-pay

## 2019-05-08 DIAGNOSIS — I4892 Unspecified atrial flutter: Secondary | ICD-10-CM

## 2019-05-08 DIAGNOSIS — I4891 Unspecified atrial fibrillation: Secondary | ICD-10-CM | POA: Diagnosis not present

## 2019-05-08 DIAGNOSIS — Z952 Presence of prosthetic heart valve: Secondary | ICD-10-CM | POA: Diagnosis not present

## 2019-05-08 DIAGNOSIS — I480 Paroxysmal atrial fibrillation: Secondary | ICD-10-CM

## 2019-05-08 DIAGNOSIS — I050 Rheumatic mitral stenosis: Secondary | ICD-10-CM

## 2019-05-08 DIAGNOSIS — Z5181 Encounter for therapeutic drug level monitoring: Secondary | ICD-10-CM | POA: Diagnosis not present

## 2019-05-08 LAB — POCT INR: INR: 2.8 (ref 2.0–3.0)

## 2019-06-05 ENCOUNTER — Ambulatory Visit (INDEPENDENT_AMBULATORY_CARE_PROVIDER_SITE_OTHER): Payer: BC Managed Care – PPO | Admitting: Pharmacist Clinician (PhC)/ Clinical Pharmacy Specialist

## 2019-06-05 ENCOUNTER — Encounter (INDEPENDENT_AMBULATORY_CARE_PROVIDER_SITE_OTHER): Payer: Self-pay

## 2019-06-05 ENCOUNTER — Other Ambulatory Visit: Payer: Self-pay

## 2019-06-05 DIAGNOSIS — Z952 Presence of prosthetic heart valve: Secondary | ICD-10-CM | POA: Diagnosis not present

## 2019-06-05 DIAGNOSIS — I4892 Unspecified atrial flutter: Secondary | ICD-10-CM

## 2019-06-05 DIAGNOSIS — I480 Paroxysmal atrial fibrillation: Secondary | ICD-10-CM | POA: Diagnosis not present

## 2019-06-05 DIAGNOSIS — Z5181 Encounter for therapeutic drug level monitoring: Secondary | ICD-10-CM

## 2019-06-05 DIAGNOSIS — I050 Rheumatic mitral stenosis: Secondary | ICD-10-CM

## 2019-06-05 LAB — POCT INR: INR: 2.4 (ref 2.0–3.0)

## 2019-06-10 IMAGING — CR DG CHEST 2V
2 series · 2 of 2 positions shown · non-contrast
Comparison: None.

CLINICAL DATA: Shortness of breath.  Chest tightness.

EXAM:
CHEST  2 VIEW

[chest pa]
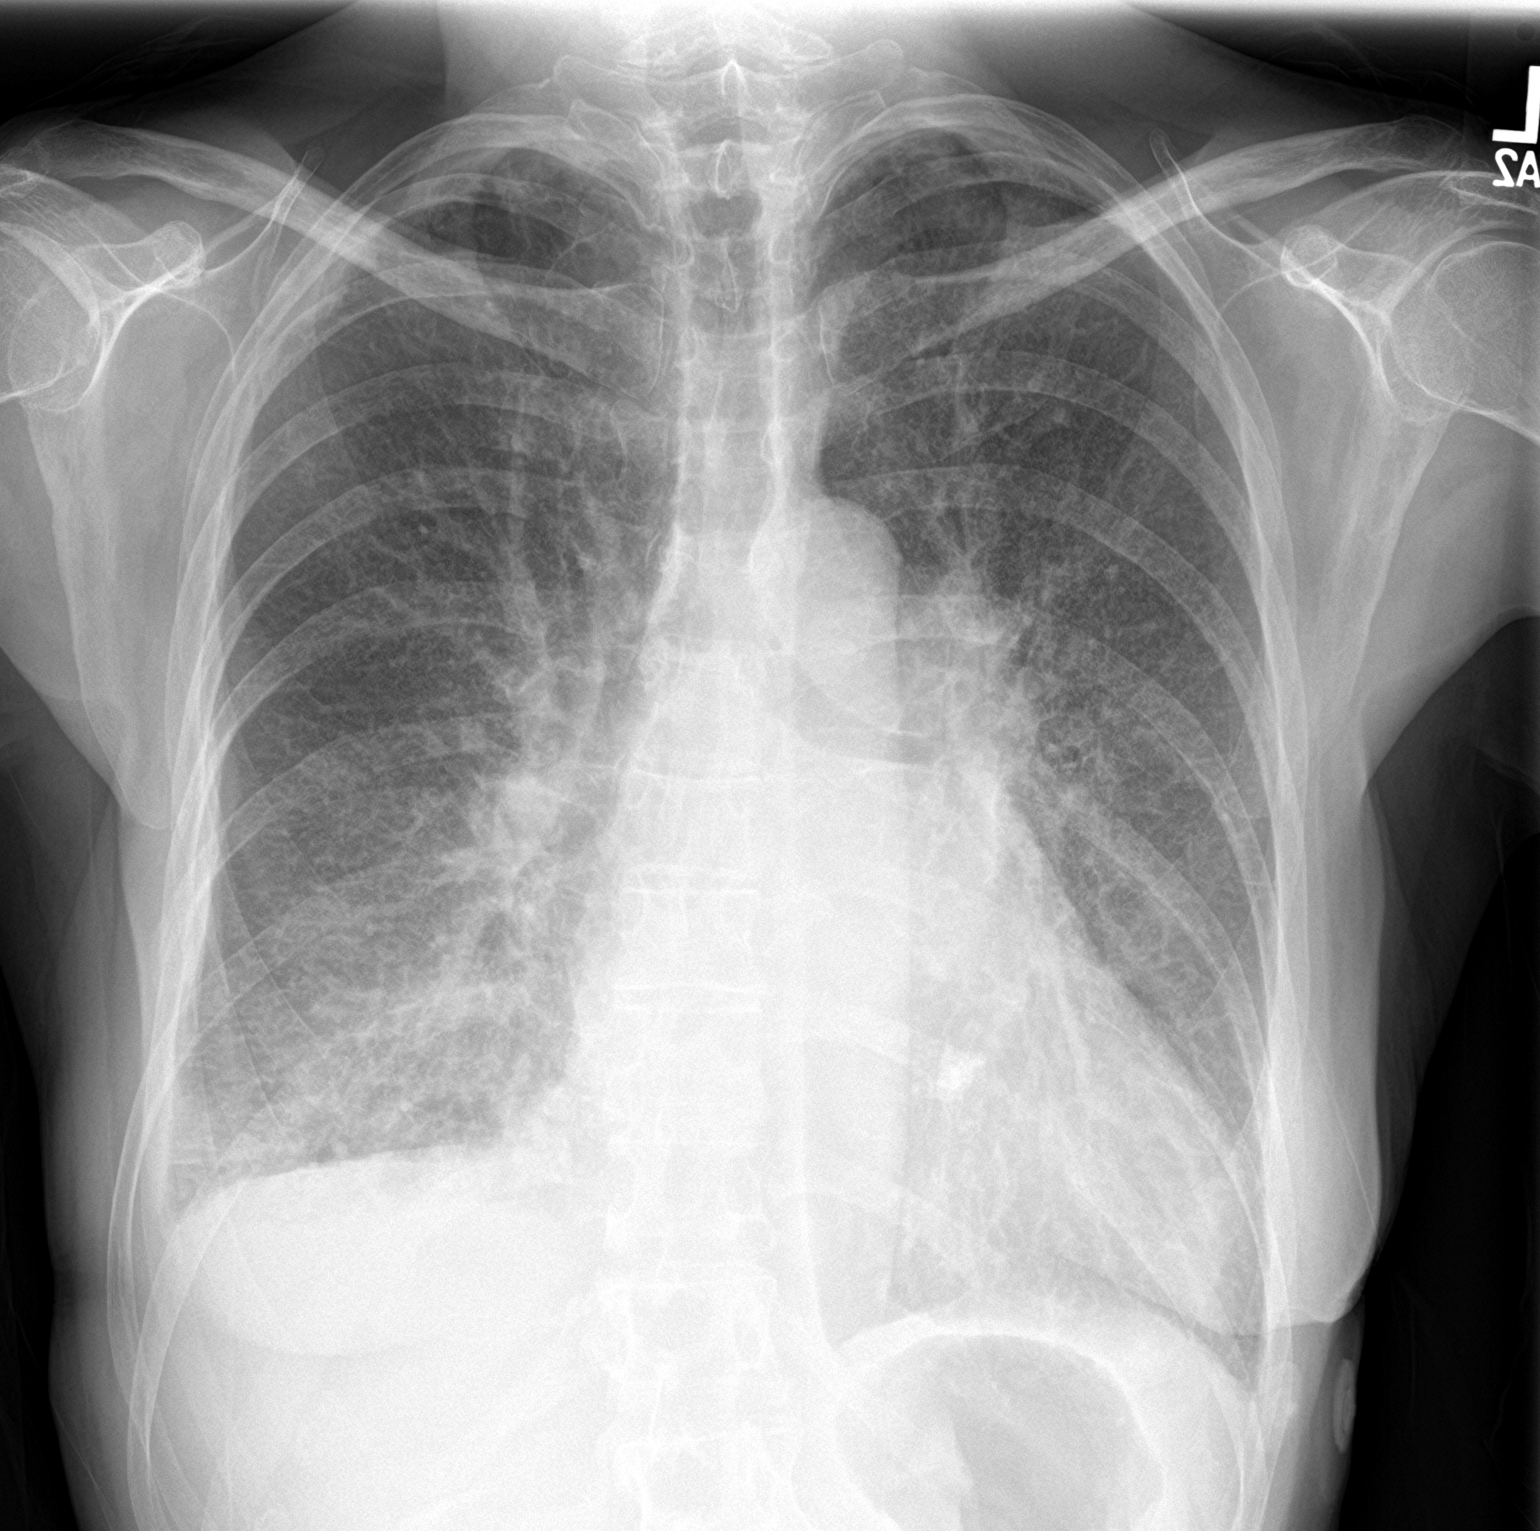

[chest lat]
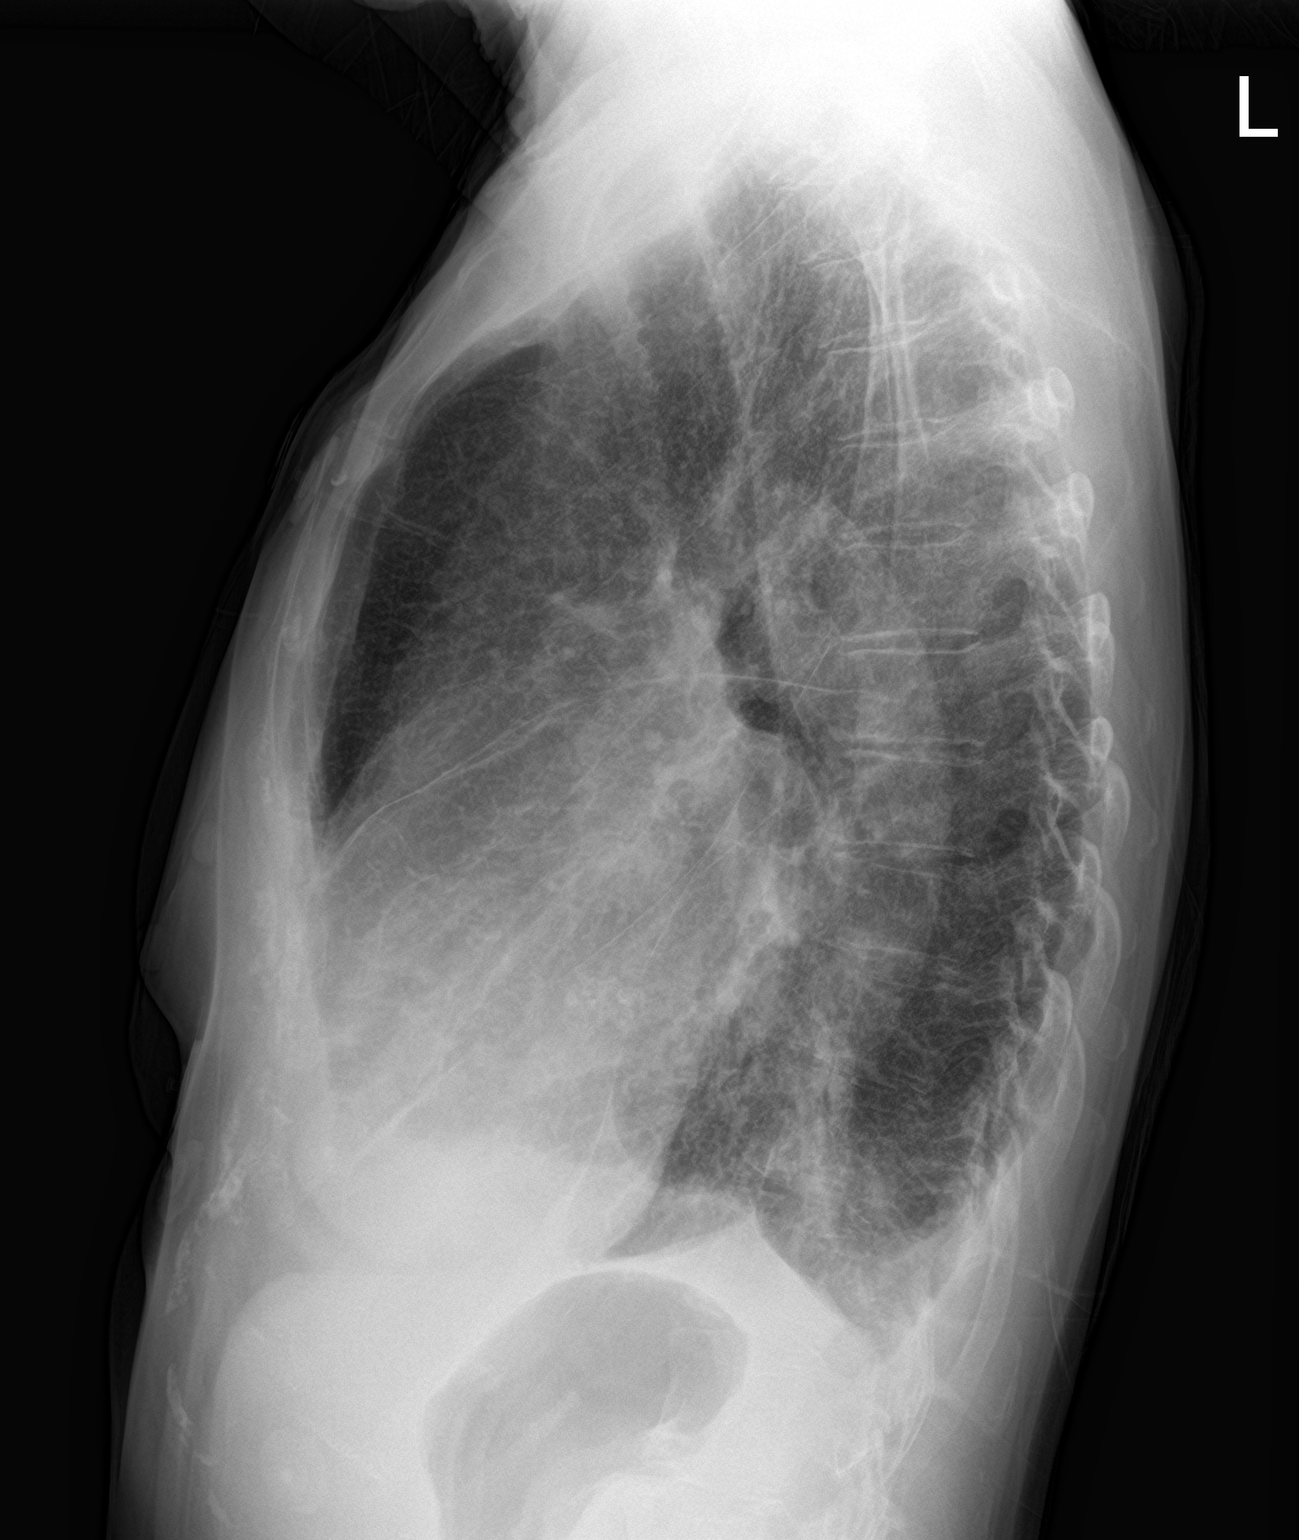

[2 of 2 positions shown; findings below may reference images not displayed]

FINDINGS: Cardiomegaly. The hila and mediastinum are normal. Increased
interstitial markings in the lungs, most prominent in the bases.
Small effusions based on the lateral view. No other acute
abnormalities.
IMPRESSION: Pulmonary edema.  Small effusions.

## 2019-06-16 ENCOUNTER — Other Ambulatory Visit: Payer: Self-pay | Admitting: Cardiology

## 2019-06-19 IMAGING — CR DG CHEST 2V
2 series · 2 of 2 positions shown · non-contrast
Comparison: 04/06/2017

CLINICAL DATA: Recent cardiac valve replacement

EXAM:
CHEST  2 VIEW

[chest lat]
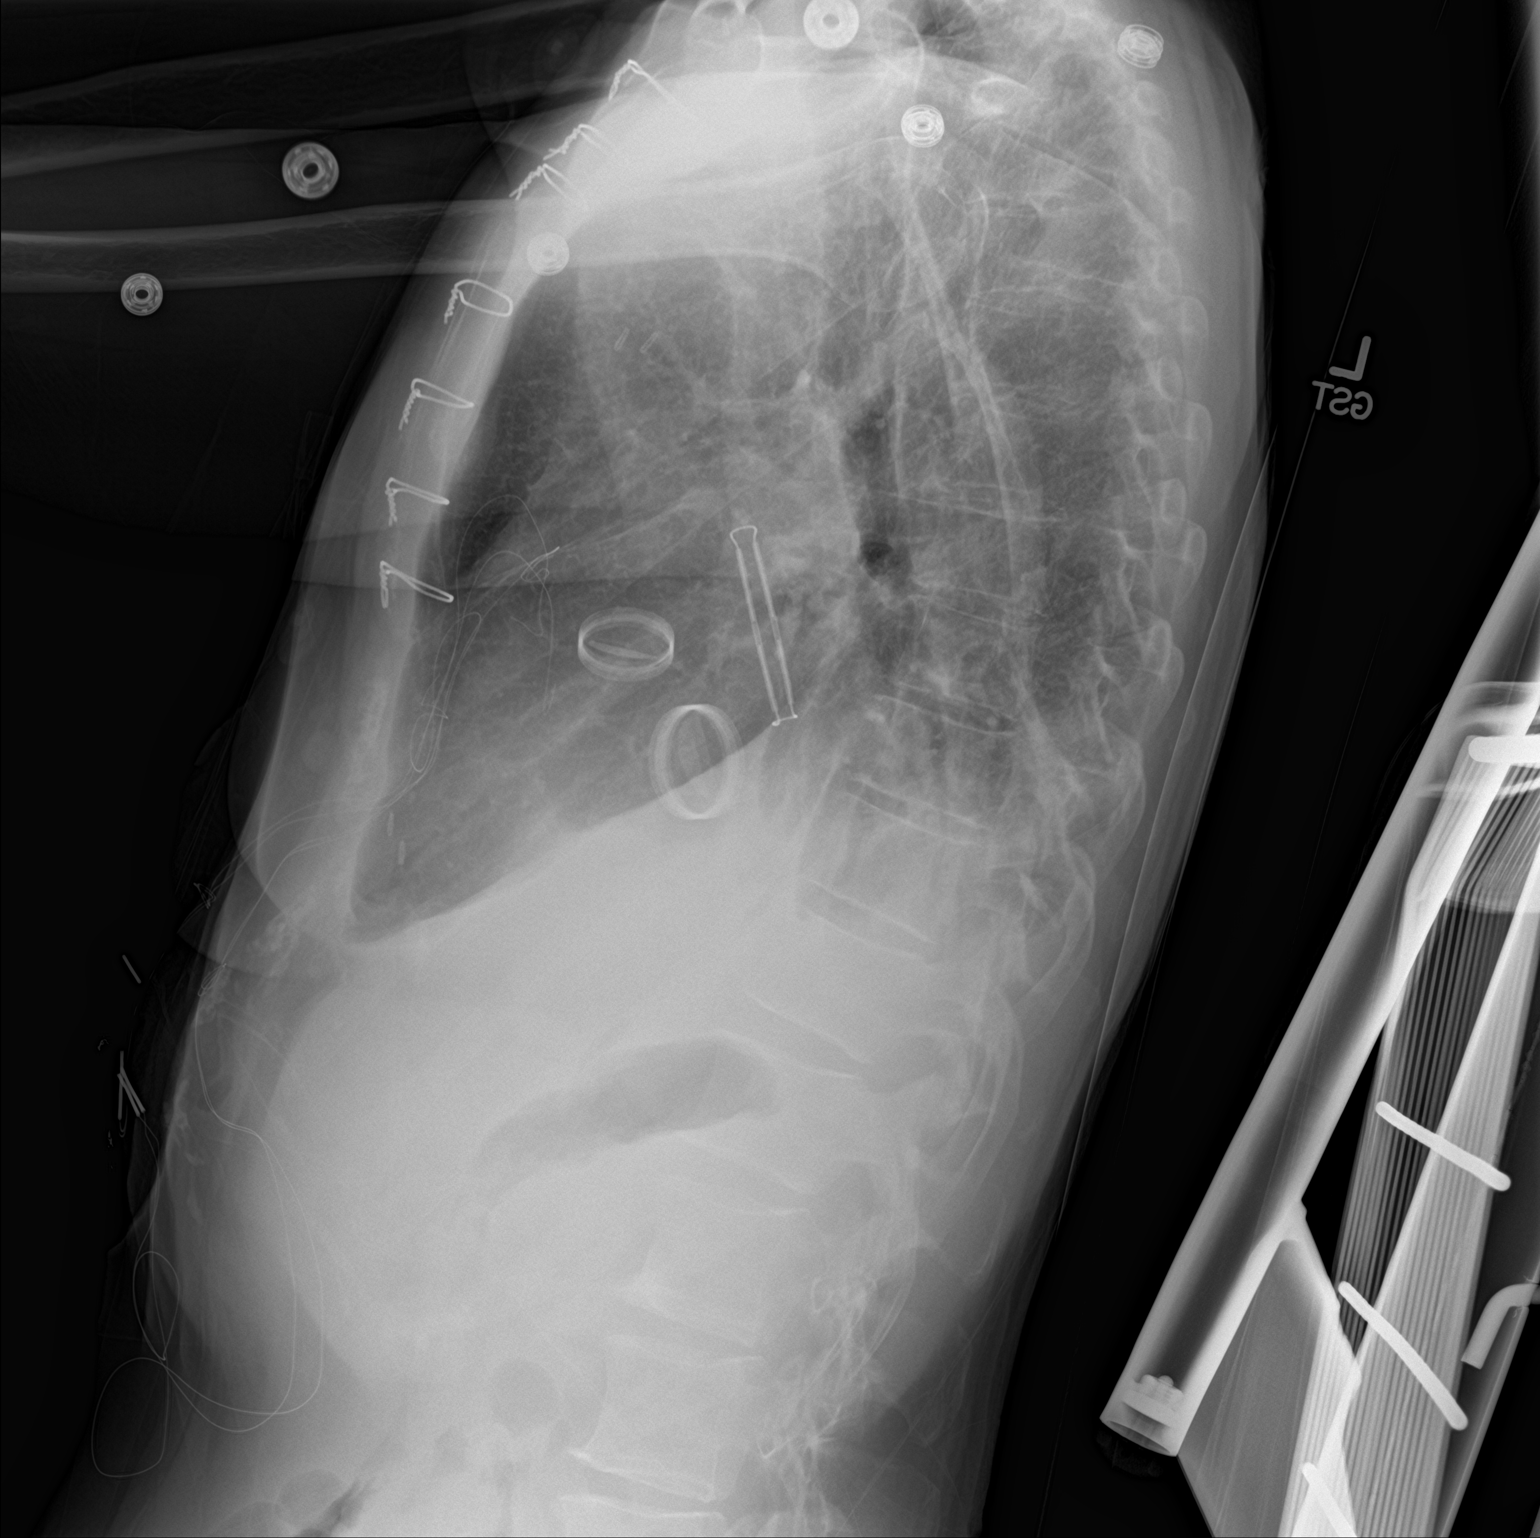

[chest ap]
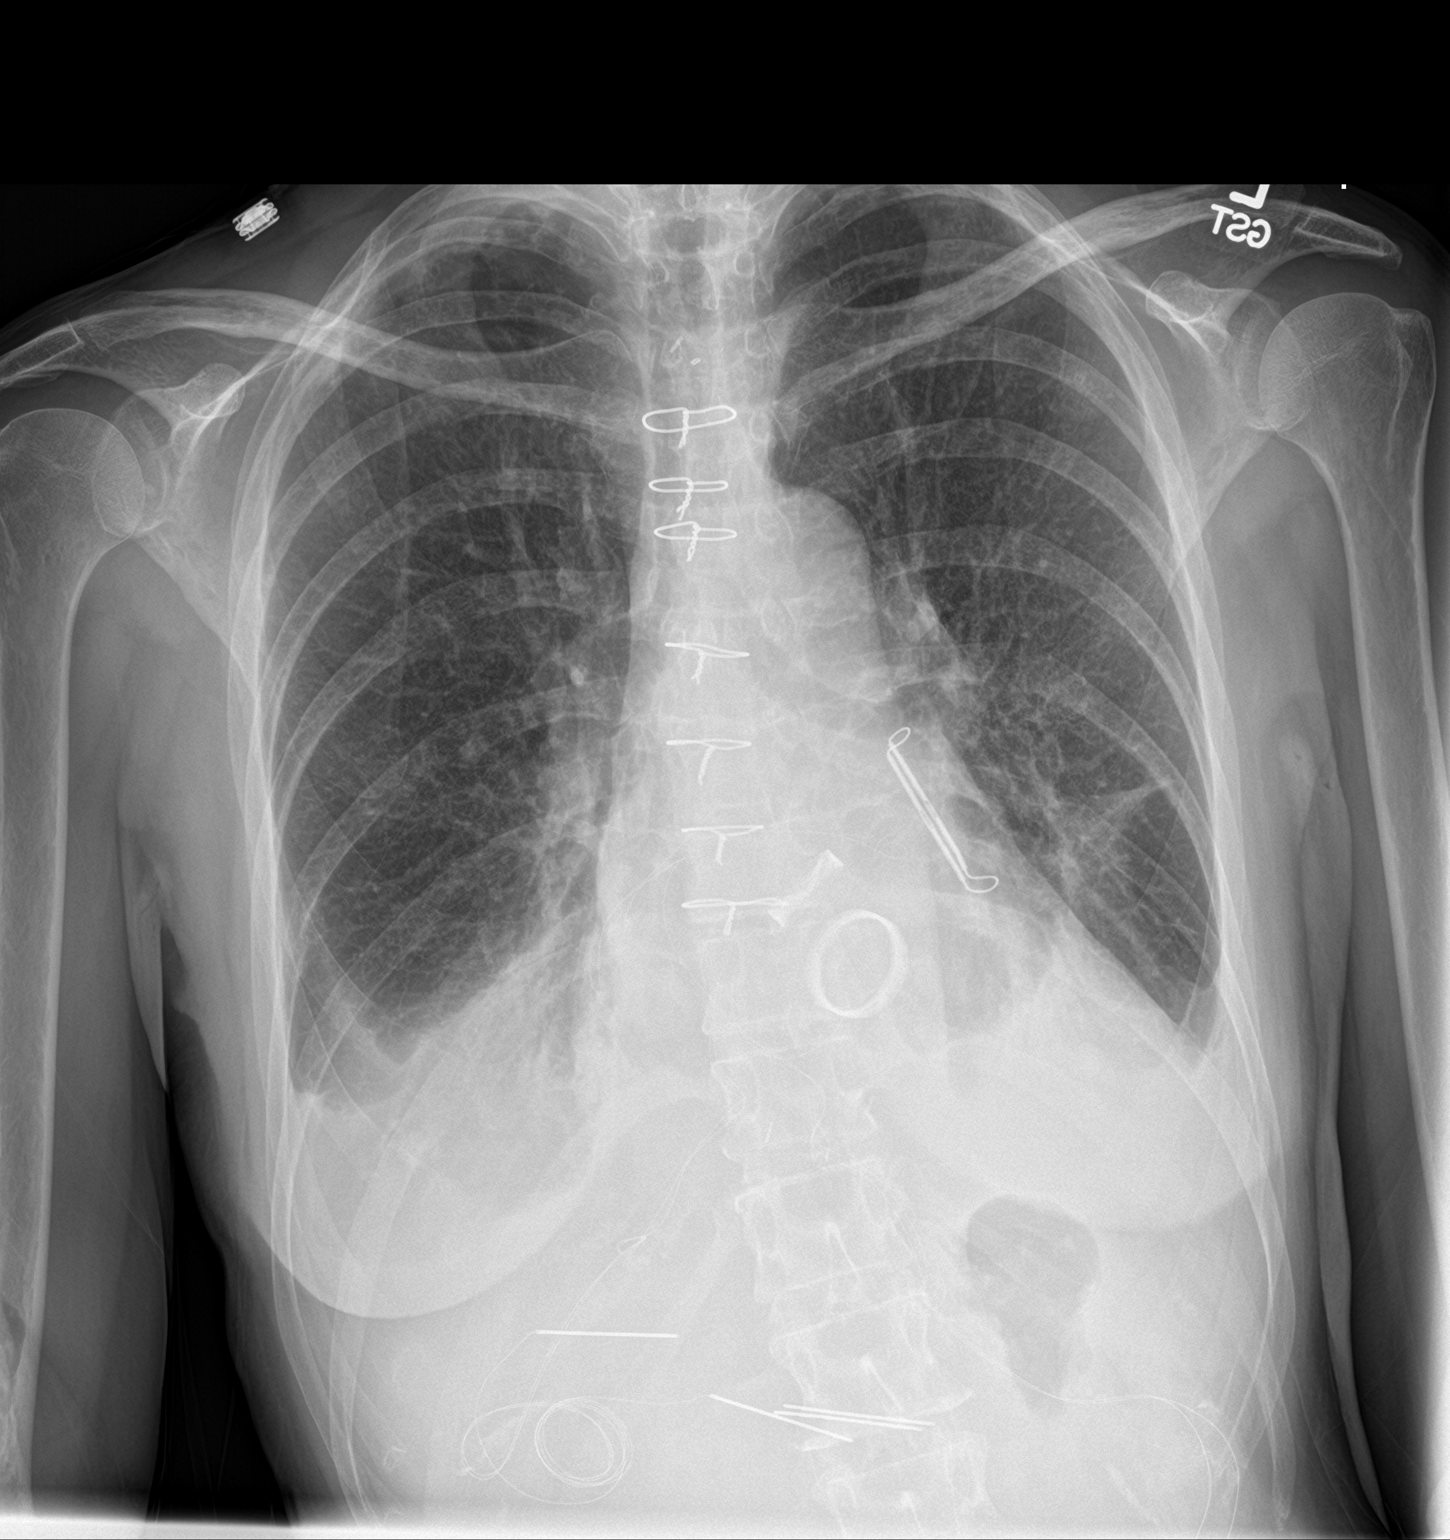

[2 of 2 positions shown; findings below may reference images not displayed]

FINDINGS: Cardiac shadow is stable. Postoperative changes are again
identified. The lungs are well aerated bilaterally. Small bilateral
pleural effusions are again noted with bibasilar atelectasis. No
pneumothorax is noted. No bony abnormality is seen.
IMPRESSION: Stable bibasilar changes.

## 2019-06-24 DIAGNOSIS — D126 Benign neoplasm of colon, unspecified: Secondary | ICD-10-CM | POA: Diagnosis not present

## 2019-06-24 DIAGNOSIS — K254 Chronic or unspecified gastric ulcer with hemorrhage: Secondary | ICD-10-CM | POA: Diagnosis not present

## 2019-06-26 ENCOUNTER — Ambulatory Visit (INDEPENDENT_AMBULATORY_CARE_PROVIDER_SITE_OTHER): Payer: BC Managed Care – PPO | Admitting: Pharmacist Clinician (PhC)/ Clinical Pharmacy Specialist

## 2019-06-26 ENCOUNTER — Other Ambulatory Visit: Payer: Self-pay

## 2019-06-26 DIAGNOSIS — Z952 Presence of prosthetic heart valve: Secondary | ICD-10-CM

## 2019-06-26 DIAGNOSIS — I4892 Unspecified atrial flutter: Secondary | ICD-10-CM

## 2019-06-26 DIAGNOSIS — I480 Paroxysmal atrial fibrillation: Secondary | ICD-10-CM

## 2019-06-26 DIAGNOSIS — Z5181 Encounter for therapeutic drug level monitoring: Secondary | ICD-10-CM

## 2019-06-26 DIAGNOSIS — I050 Rheumatic mitral stenosis: Secondary | ICD-10-CM

## 2019-06-26 LAB — POCT INR: INR: 3.2 — AB (ref 2.0–3.0)

## 2019-07-22 NOTE — Progress Notes (Signed)
Cardiology Clinic Note   Patient Name: Sharon Figueroa Date of Encounter: 07/24/2019  Primary Care Provider:  Patient, No Pcp Per Primary Cardiologist:  Kirk Ruths, MD  Patient Profile    Sharon Figueroa 59 year old female presents today for follow-up of her atrial fibrillation, mitral stenosis, and atrial flutter.  Past Medical History    Past Medical History:  Diagnosis Date  . Mitral valve disorder    "leaky" valve diagnosed 30+ yr ago   Past Surgical History:  Procedure Laterality Date  . AORTIC VALVE REPLACEMENT N/A 04/04/2017   Procedure: AORTIC VALVE REPLACEMENT (AVR);  Surgeon: Gaye Pollack, MD;  Location: [place];  Service: Open Heart Surgery;  Laterality: N/A;  . BIOPSY  11/21/2018   Procedure: BIOPSY;  Surgeon: [name], MD;  Location: [place];  Service: Gastroenterology;;  . CARDIAC SURGERY    . COLONOSCOPY WITH PROPOFOL N/A 11/21/2018   Procedure: COLONOSCOPY WITH PROPOFOL;  Surgeon: [name], MD;  Location: [place];  Service: Gastroenterology;  Laterality: N/A;  . ESOPHAGOGASTRODUODENOSCOPY N/A 11/20/2018   Procedure: ESOPHAGOGASTRODUODENOSCOPY (EGD);  Surgeon: [name], MD;  Location: [place];  Service: Gastroenterology;  Laterality: N/A;  . HOT HEMOSTASIS N/A 11/20/2018   Procedure: HOT HEMOSTASIS (ARGON PLASMA COAGULATION/BICAP);  Surgeon: [name], MD;  Location: [place];  Service: Gastroenterology;  Laterality: N/A;  . MAZE N/A 04/04/2017   Procedure: MAZE;  Surgeon: Gaye Pollack, MD;  Location: [place];  Service: Open Heart Surgery;  Laterality: N/A;  . MITRAL VALVE REPLACEMENT N/A 04/04/2017   Procedure: MITRAL VALVE (MV) REPLACEMENT;  Surgeon: Gaye Pollack, MD;  Location: [place];  Service: Open Heart Surgery;  Laterality: N/A;  . No prior surgery    . POLYPECTOMY  11/21/2018   Procedure: POLYPECTOMY;  Surgeon: [name], MD;  Location: [facility] ENDOSCOPY;  Service: Gastroenterology;;  . RIGHT/LEFT HEART CATH AND CORONARY  ANGIOGRAPHY N/A 04/01/2017   Procedure: RIGHT/LEFT HEART CATH AND CORONARY ANGIOGRAPHY;  Surgeon: Lorretta Harp, MD;  Location: [facility];  Service: Cardiovascular;  Laterality: N/A;  . TEE WITHOUT CARDIOVERSION N/A 04/04/2017   Procedure: TRANSESOPHAGEAL ECHOCARDIOGRAM (TEE);  Surgeon: Gaye Pollack, MD;  Location: [place];  Service: Open Heart Surgery;  Laterality: N/A;    Allergies  No Known Allergies  History of Present Illness    Ms. Fingerhut has a PMH of atrial fibrillation and MVR/AVR.  She was admitted 1/19 with new onset atrial fibrillation congestive heart failure.  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  Her preoperative cardiac catheterization showed normal coronary arteries.  Postoperative carotid Dopplers showed no stenosis.  She underwent aortic valve replacement and mitral valve replacement with mechanical valves and a Maze procedure with ligation of left atrial appendage.  Her echocardiogram 9/20 showed an ejection fraction of 45-50%, severe left atrial enlargement, status post mitral valve replacement with a mean gradient 7 mmHg, post aortic valve replacement with mean gradient 21 mmHg and trace aortic insufficiency.  She is noted to have GI bleed 9/20 and was found to have ulcer and polyps.  She was last seen by Dr. Stanford Breed on 01/05/2019.  During that time she was noted to have dyspnea with more vigorous activities but not with routine activities.  She denied orthopnea PND lower extremity edema, chest pain, syncope, and bleeding.  She presents the clinic today for follow-up evaluation and states  she feels well.  She does have some dyspnea with increased physical activity such as climbing stairs or walking her dog.  However, her breathing is normal with normal activities.  She has been walking  her new dog for 20 minutes 4-5 times a week.  I will order a CBC today, give her the salty 6 diet sheet, have her increase her physical activity as tolerated and follow-up in 1 year.  Today she denies chest pain, shortness of breath, lower extremity edema, fatigue, palpitations, melena, hematuria, hemoptysis, diaphoresis, weakness, presyncope, syncope, orthopnea, and PND.   Home Medications    Prior to Admission medications   Medication Sig Start Date End Date Taking? Authorizing Provider  acetaminophen (TYLENOL) 325 MG tablet Take 2 tablets (650 mg total) by mouth every 6 (six) hours as needed for mild pain. 04/09/17   Ardelle Balls, PA-C  aspirin EC 81 MG tablet Take 1 tablet (81 mg total) by mouth daily. 01/05/19   Lewayne Bunting, MD  pantoprazole (PROTONIX) 40 MG tablet Take 1 tablet (40 mg total) by mouth 2 (two) times daily before a meal. 11/26/18 01/25/19  Glade Lloyd, MD  warfarin (COUMADIN) 5 MG tablet TAKE 1-2 TABLETS BY MOUTH DAILY OR AS DIRECTED BY COUMADIN CLINIC 06/17/19   Lewayne Bunting, MD    Family History    Family History  Problem Relation Age of Onset  . CVA Father   . Heart attack Father    She indicated that her mother is alive. She indicated that her father is alive.  Social History    Social History   Socioeconomic History  . Marital status: Married    Spouse name: Not on file  . Number of children: 3  . Years of education: Not on file  . Highest education level: Not on file  Occupational History  . Not on file  Tobacco Use  . Smoking status: Former Smoker    Packs/day: 1.00    Types: Cigarettes    Quit date: 03/30/2017    Years since quitting: 2.3  . Smokeless tobacco: Never Used  Substance and Sexual Activity  . Alcohol use: No  . Drug use: No  . Sexual activity: Not on file  Other Topics Concern  . Not on file  Social History Narrative  . Not on file   Social Determinants of Health   Financial Resource Strain:   . Difficulty  of Paying Living Expenses:   Food Insecurity:   . Worried About Programme researcher, broadcasting/film/video in the Last Year:   . Barista in the Last Year:   Transportation Needs:   . Freight forwarder (Medical):   Marland Kitchen Lack of Transportation (Non-Medical):   Physical Activity:   . Days of Exercise per Week:   . Minutes of Exercise per Session:   Stress:   . Feeling of Stress :   Social Connections:   . Frequency of Communication with Friends and Family:   . Frequency of Social Gatherings with Friends and Family:   . Attends Religious Services:   . Active Member of Clubs or Organizations:   . Attends Banker Meetings:   Marland Kitchen Marital Status:   Intimate Partner Violence:   . Fear of Current or Ex-Partner:   . Emotionally Abused:   Marland Kitchen Physically Abused:   . Sexually Abused:      Review of Systems    General:  No chills, fever, night sweats or weight changes.  Cardiovascular:  No chest pain, dyspnea on exertion, edema, orthopnea, palpitations, paroxysmal nocturnal dyspnea. Dermatological: No rash, lesions/masses Respiratory: No cough, dyspnea Urologic: No hematuria, dysuria Abdominal:   No nausea, vomiting, diarrhea, bright red blood per rectum, melena, or hematemesis Neurologic:  No visual changes, wkns, changes in mental status. All other systems reviewed and are otherwise negative except as noted above.  Physical Exam    VS:  BP 121/70   Pulse 82   Temp 97.9 F (36.6 C)   Ht 5\' 6"  (1.676 m)   Wt 183 lb 12.8 oz (83.4 kg)   SpO2 98%   BMI 29.67 kg/m  , BMI Body mass index is 29.67 kg/m. GEN: Well nourished, well developed, in no acute distress. HEENT: normal. Neck: Supple, no JVD, carotid bruits, or masses. Cardiac: RRR, no murmurs, rubs, or gallops. No clubbing, cyanosis, edema.  Radials/DP/PT 2+ and equal bilaterally.  Respiratory:  Respirations regular and unlabored, clear to auscultation bilaterally. GI: Soft, nontender, nondistended, BS + x 4. MS: no deformity or  atrophy. Skin: warm and dry, no rash. Neuro:  Strength and sensation are intact. Psych: Normal affect.  Accessory Clinical Findings    ECG personally reviewed by me today-none today.- No acute changes  EKG 01/05/2019 Sinus rhythm with occasional PVCs 88 bpm  Echocardiogram 11/19/2018 IMPRESSIONS    1. The left ventricle has mildly reduced systolic function, with an  ejection fraction of 45-50%. The cavity size was normal. mild posterior  wall hypertrophy. Left ventricular diastolic Doppler parameters are  indeterminate.  2. The right ventricle has normal systolic function. The cavity was  normal. There is No increase in right ventricular wall thickness.  3. Left atrial size was severely dilated.  4. Right atrial size was normal.  5. Mild mitral valve stenosis.  6. Compared with the echo 08/2017, mean gradient across the mechanical  mitral valve has increase from 4 mmHg to 7 mmHg.  7. Aortic valve regurgitation is trivial by color flow Doppler. Mild  stenosis of the aortic valve.  8. Compared with the echo 11/2017, mean gradient across the mechanical  aortic valve has increased from 14 mmHg to 21 mmHg. Peak velocity has  increased from 2.5 m/s to 3.1 m/s.  9. Pulmonic valve regurgitation is not visualized by color flow Doppler.  10. The aorta is normal unless otherwise noted.  11. The inferior vena cava was is dilated in size with <50% respiratory  variability, suggesting right atrial pressure of 15 mmHg.   Assessment & Plan   1.  Status post AVR/MVR-no increased dyspnea or activity intolerance.  Echocardiogram 9/20 showed EF of 45-50% mild mitral valve stenosis mean gradient 7 mmHg, aortic valve regurgitation trivial mild stenosis and aortic valve mean gradient 21 mmHg Continue Coumadin, aspirin Continue SBE prophylaxis Heart healthy low-sodium diet Increase physical activity as tolerated  Paroxysmal atrial fibrillation-heart rate today 82 bpm.  Underwent Maze  procedure at time of AVR MVR. Continue Coumadin Heart healthy low-sodium diet Increase physical activity as tolerated  Chronic combined systolic and diastolic heart failure-asymptomatic. Continue heart healthy low-sodium diet Daily weights Fluid restriction  Disposition: Follow-up with Dr. 10/20 in 12 months.   Jens Som. Aaisha Sliter NP-C    07/24/2019, 8:37 AM [facility] [address] Office [phone] Fax [phone]

## 2019-07-24 ENCOUNTER — Ambulatory Visit (INDEPENDENT_AMBULATORY_CARE_PROVIDER_SITE_OTHER): Payer: BC Managed Care – PPO | Admitting: Pharmacist

## 2019-07-24 ENCOUNTER — Ambulatory Visit (INDEPENDENT_AMBULATORY_CARE_PROVIDER_SITE_OTHER): Payer: BC Managed Care – PPO | Admitting: General Practice

## 2019-07-24 ENCOUNTER — Encounter: Payer: Self-pay | Admitting: General Practice

## 2019-07-24 ENCOUNTER — Other Ambulatory Visit: Payer: Self-pay

## 2019-07-24 VITALS — BP 121/70 | HR 82 | Temp 97.9°F | Ht 66.0 in | Wt 183.8 lb

## 2019-07-24 DIAGNOSIS — Z79899 Other long term (current) drug therapy: Secondary | ICD-10-CM

## 2019-07-24 DIAGNOSIS — I4892 Unspecified atrial flutter: Secondary | ICD-10-CM | POA: Diagnosis not present

## 2019-07-24 DIAGNOSIS — Z952 Presence of prosthetic heart valve: Secondary | ICD-10-CM

## 2019-07-24 DIAGNOSIS — Z5181 Encounter for therapeutic drug level monitoring: Secondary | ICD-10-CM | POA: Diagnosis not present

## 2019-07-24 DIAGNOSIS — I480 Paroxysmal atrial fibrillation: Secondary | ICD-10-CM | POA: Diagnosis not present

## 2019-07-24 DIAGNOSIS — I5032 Chronic diastolic (congestive) heart failure: Secondary | ICD-10-CM

## 2019-07-24 DIAGNOSIS — I050 Rheumatic mitral stenosis: Secondary | ICD-10-CM

## 2019-07-24 LAB — CBC
Hematocrit: 38 % (ref 34.0–46.6)
Hemoglobin: 12.3 g/dL (ref 11.1–15.9)
MCH: 29.5 pg (ref 26.6–33.0)
MCHC: 32.4 g/dL (ref 31.5–35.7)
MCV: 91 fL (ref 79–97)
Platelets: 293 10*3/uL (ref 150–450)
RBC: 4.17 x10E6/uL (ref 3.77–5.28)
RDW: 13.1 % (ref 11.7–15.4)
WBC: 5.5 10*3/uL (ref 3.4–10.8)

## 2019-07-24 LAB — POCT INR: INR: 3.3 — AB (ref 2.0–3.0)

## 2019-07-24 MED ORDER — PANTOPRAZOLE SODIUM 40 MG PO TBEC: 40.0000 mg | DELAYED_RELEASE_TABLET | ORAL | 1 refills | Status: AC

## 2019-07-24 NOTE — Patient Instructions (Signed)
Medication Instructions:  The current medical regimen is effective;  continue present plan and medications as directed. Please refer to the Current Medication list given to you today. *If you need a refill on your cardiac medications before your next appointment, please call your pharmacy*  Lab Work: CBC TODAY HERE IN OUR LAB If you have labs (blood work) drawn today and your tests are completely normal, you will receive your results only by:  MyChart Message (if you have MyChart) OR A paper copy in the mail.  If you have any lab test that is abnormal or we need to change your treatment, we will call you to review the results. You may go to any [facility] that is convenient for you however, we do have a lab in our office that is able to assist you. You DO NOT need an appointment for our lab. The lab is open 8:00am and closes at 4:00pm. Lunch 12:45 - 1:45pm.  Special Instructions PLEASE INCREASE YOUR PHYSICAL ACTIVITY AS TOLERATED  PLEASE READ AND FOLLOW SALTY 6-ATTACHED  Follow-Up: Your next appointment:  12 month(s) Please call our office 2 months in advance to schedule this appointment In Person with You may see Olga Millers, MD or one of the following Advanced Practice Providers on your designated Care Team:  Edd Fabian, FNP  Corine Shelter, PA-C  Marjie Skiff, [place]   At [facility], you and your health needs are our priority.  As part of our continuing mission to provide you with exceptional heart care, we have created designated Provider Care Teams.  These Care Teams include your primary Cardiologist (physician) and Advanced Practice Providers (APPs -  Physician Assistants and Nurse Practitioners) who all work together to provide you with the care you need, when you need it.  We recommend signing up for the patient portal called "MyChart".  Sign up information is provided on this After Visit Summary.  MyChart is used to connect with patients for Virtual Visits (Telemedicine).  Patients  are able to view lab/test results, encounter notes, upcoming appointments, etc.  Non-urgent messages can be sent to your provider as well.   To learn more about what you can do with MyChart, go to [email or web address].

## 2019-09-04 ENCOUNTER — Other Ambulatory Visit: Payer: Self-pay

## 2019-09-04 ENCOUNTER — Ambulatory Visit (INDEPENDENT_AMBULATORY_CARE_PROVIDER_SITE_OTHER): Payer: BC Managed Care – PPO | Admitting: Pharmacist Clinician (PhC)/ Clinical Pharmacy Specialist

## 2019-09-04 DIAGNOSIS — I4891 Unspecified atrial fibrillation: Secondary | ICD-10-CM | POA: Diagnosis not present

## 2019-09-04 DIAGNOSIS — I4892 Unspecified atrial flutter: Secondary | ICD-10-CM

## 2019-09-04 DIAGNOSIS — I480 Paroxysmal atrial fibrillation: Secondary | ICD-10-CM

## 2019-09-04 DIAGNOSIS — Z952 Presence of prosthetic heart valve: Secondary | ICD-10-CM | POA: Diagnosis not present

## 2019-09-04 DIAGNOSIS — Z5181 Encounter for therapeutic drug level monitoring: Secondary | ICD-10-CM | POA: Diagnosis not present

## 2019-09-04 DIAGNOSIS — I050 Rheumatic mitral stenosis: Secondary | ICD-10-CM

## 2019-09-04 LAB — POCT INR: INR: 3.4 — AB (ref 2.0–3.0)

## 2019-10-16 ENCOUNTER — Ambulatory Visit (INDEPENDENT_AMBULATORY_CARE_PROVIDER_SITE_OTHER): Payer: BC Managed Care – PPO

## 2019-10-16 ENCOUNTER — Other Ambulatory Visit: Payer: Self-pay

## 2019-10-16 DIAGNOSIS — I480 Paroxysmal atrial fibrillation: Secondary | ICD-10-CM

## 2019-10-16 DIAGNOSIS — I4892 Unspecified atrial flutter: Secondary | ICD-10-CM | POA: Diagnosis not present

## 2019-10-16 DIAGNOSIS — Z5181 Encounter for therapeutic drug level monitoring: Secondary | ICD-10-CM | POA: Diagnosis not present

## 2019-10-16 DIAGNOSIS — I050 Rheumatic mitral stenosis: Secondary | ICD-10-CM

## 2019-10-16 DIAGNOSIS — Z952 Presence of prosthetic heart valve: Secondary | ICD-10-CM

## 2019-10-16 LAB — POCT INR: INR: 2.9 (ref 2.0–3.0)

## 2019-10-16 NOTE — Patient Instructions (Signed)
Take 2 tablets today and then Continue with 1 tablet daily except 1.5 tablets on Mondays, Wednesday and Fridays. Recheck in 6 weeks

## 2019-11-27 ENCOUNTER — Ambulatory Visit (INDEPENDENT_AMBULATORY_CARE_PROVIDER_SITE_OTHER): Payer: BC Managed Care – PPO

## 2019-11-27 ENCOUNTER — Other Ambulatory Visit: Payer: Self-pay

## 2019-11-27 DIAGNOSIS — I050 Rheumatic mitral stenosis: Secondary | ICD-10-CM | POA: Diagnosis not present

## 2019-11-27 DIAGNOSIS — I480 Paroxysmal atrial fibrillation: Secondary | ICD-10-CM | POA: Diagnosis not present

## 2019-11-27 DIAGNOSIS — Z952 Presence of prosthetic heart valve: Secondary | ICD-10-CM | POA: Diagnosis not present

## 2019-11-27 DIAGNOSIS — I4892 Unspecified atrial flutter: Secondary | ICD-10-CM

## 2019-11-27 DIAGNOSIS — Z5181 Encounter for therapeutic drug level monitoring: Secondary | ICD-10-CM

## 2019-11-27 LAB — POCT INR: INR: 2.8 (ref 2.0–3.0)

## 2019-11-27 NOTE — Patient Instructions (Signed)
Take 2 tablets today and then Continue with 1 tablet daily except 1.5 tablets on Mondays, Wednesday and Fridays. Recheck in 6 weeks   

## 2019-12-11 ENCOUNTER — Telehealth: Payer: Self-pay | Admitting: Cardiology

## 2019-12-11 DIAGNOSIS — J029 Acute pharyngitis, unspecified: Secondary | ICD-10-CM | POA: Diagnosis not present

## 2019-12-11 NOTE — Telephone Encounter (Signed)
LPMTCB in regards to Amoxicillin start up.

## 2019-12-11 NOTE — Telephone Encounter (Signed)
Patient called stating she is being put on amoxicillin, and she is on warfarin.  She wants to know if her warfarin needs to change.

## 2019-12-11 NOTE — Telephone Encounter (Signed)
I spoke to the pt and gave her advisement on Amoxicillin and that it should not effect INR/Warfarin.  She verbalized understanding.

## 2019-12-31 ENCOUNTER — Other Ambulatory Visit: Payer: Self-pay | Admitting: Cardiology

## 2020-01-08 ENCOUNTER — Other Ambulatory Visit: Payer: Self-pay

## 2020-01-08 ENCOUNTER — Ambulatory Visit (INDEPENDENT_AMBULATORY_CARE_PROVIDER_SITE_OTHER): Payer: BC Managed Care – PPO

## 2020-01-08 DIAGNOSIS — I050 Rheumatic mitral stenosis: Secondary | ICD-10-CM

## 2020-01-08 DIAGNOSIS — Z952 Presence of prosthetic heart valve: Secondary | ICD-10-CM | POA: Diagnosis not present

## 2020-01-08 DIAGNOSIS — I480 Paroxysmal atrial fibrillation: Secondary | ICD-10-CM | POA: Diagnosis not present

## 2020-01-08 DIAGNOSIS — I4892 Unspecified atrial flutter: Secondary | ICD-10-CM | POA: Diagnosis not present

## 2020-01-08 DIAGNOSIS — Z5181 Encounter for therapeutic drug level monitoring: Secondary | ICD-10-CM | POA: Diagnosis not present

## 2020-01-08 LAB — POCT INR: INR: 3.3 — AB (ref 2.0–3.0)

## 2020-01-08 NOTE — Patient Instructions (Signed)
Continue with 1 tablet daily except 1.5 tablets on Mondays, Wednesday and Fridays. Recheck in 5 weeks

## 2020-02-10 ENCOUNTER — Ambulatory Visit (INDEPENDENT_AMBULATORY_CARE_PROVIDER_SITE_OTHER): Payer: BC Managed Care – PPO

## 2020-02-10 ENCOUNTER — Other Ambulatory Visit: Payer: Self-pay

## 2020-02-10 DIAGNOSIS — Z952 Presence of prosthetic heart valve: Secondary | ICD-10-CM

## 2020-02-10 DIAGNOSIS — I4892 Unspecified atrial flutter: Secondary | ICD-10-CM

## 2020-02-10 DIAGNOSIS — I480 Paroxysmal atrial fibrillation: Secondary | ICD-10-CM

## 2020-02-10 DIAGNOSIS — Z5181 Encounter for therapeutic drug level monitoring: Secondary | ICD-10-CM

## 2020-02-10 DIAGNOSIS — I050 Rheumatic mitral stenosis: Secondary | ICD-10-CM

## 2020-02-10 LAB — POCT INR: INR: 2.9 (ref 2.0–3.0)

## 2020-02-10 NOTE — Patient Instructions (Signed)
Continue with 1 tablet daily except 1.5 tablets on Mondays, Wednesday and Fridays. Recheck in 6 weeks   

## 2020-04-01 ENCOUNTER — Other Ambulatory Visit: Payer: Self-pay

## 2020-04-01 ENCOUNTER — Ambulatory Visit (INDEPENDENT_AMBULATORY_CARE_PROVIDER_SITE_OTHER): Payer: BC Managed Care – PPO

## 2020-04-01 DIAGNOSIS — I4892 Unspecified atrial flutter: Secondary | ICD-10-CM | POA: Diagnosis not present

## 2020-04-01 DIAGNOSIS — I480 Paroxysmal atrial fibrillation: Secondary | ICD-10-CM

## 2020-04-01 DIAGNOSIS — Z952 Presence of prosthetic heart valve: Secondary | ICD-10-CM | POA: Diagnosis not present

## 2020-04-01 DIAGNOSIS — Z5181 Encounter for therapeutic drug level monitoring: Secondary | ICD-10-CM | POA: Diagnosis not present

## 2020-04-01 DIAGNOSIS — I050 Rheumatic mitral stenosis: Secondary | ICD-10-CM

## 2020-04-01 LAB — POCT INR: INR: 2.6 (ref 2.0–3.0)

## 2020-04-01 NOTE — Patient Instructions (Signed)
Take 2 tablets tonight only and then Continue with 1 tablet daily except 1.5 tablets on Mondays, Wednesday and Fridays. Recheck in 4 weeks

## 2020-04-29 ENCOUNTER — Other Ambulatory Visit: Payer: Self-pay

## 2020-04-29 ENCOUNTER — Ambulatory Visit (INDEPENDENT_AMBULATORY_CARE_PROVIDER_SITE_OTHER): Payer: BC Managed Care – PPO

## 2020-04-29 DIAGNOSIS — I4892 Unspecified atrial flutter: Secondary | ICD-10-CM

## 2020-04-29 DIAGNOSIS — I480 Paroxysmal atrial fibrillation: Secondary | ICD-10-CM | POA: Diagnosis not present

## 2020-04-29 DIAGNOSIS — Z5181 Encounter for therapeutic drug level monitoring: Secondary | ICD-10-CM | POA: Diagnosis not present

## 2020-04-29 DIAGNOSIS — I050 Rheumatic mitral stenosis: Secondary | ICD-10-CM | POA: Diagnosis not present

## 2020-04-29 DIAGNOSIS — Z952 Presence of prosthetic heart valve: Secondary | ICD-10-CM

## 2020-04-29 LAB — POCT INR: INR: 3.3 — AB (ref 2.0–3.0)

## 2020-04-29 NOTE — Patient Instructions (Signed)
Continue with 1 tablet daily except 1.5 tablets on Mondays, Wednesday and Fridays. Recheck in 6 weeks

## 2020-06-10 ENCOUNTER — Ambulatory Visit (INDEPENDENT_AMBULATORY_CARE_PROVIDER_SITE_OTHER): Payer: BC Managed Care – PPO

## 2020-06-10 ENCOUNTER — Other Ambulatory Visit: Payer: Self-pay

## 2020-06-10 DIAGNOSIS — Z952 Presence of prosthetic heart valve: Secondary | ICD-10-CM

## 2020-06-10 DIAGNOSIS — I050 Rheumatic mitral stenosis: Secondary | ICD-10-CM

## 2020-06-10 DIAGNOSIS — I4892 Unspecified atrial flutter: Secondary | ICD-10-CM

## 2020-06-10 DIAGNOSIS — Z5181 Encounter for therapeutic drug level monitoring: Secondary | ICD-10-CM

## 2020-06-10 DIAGNOSIS — I480 Paroxysmal atrial fibrillation: Secondary | ICD-10-CM | POA: Diagnosis not present

## 2020-06-10 LAB — POCT INR: INR: 3.2 — AB (ref 2.0–3.0)

## 2020-06-10 NOTE — Patient Instructions (Signed)
Continue with 1 tablet daily except 1.5 tablets on Mondays, Wednesday and Fridays. Recheck in 6 weeks

## 2020-07-22 ENCOUNTER — Ambulatory Visit (INDEPENDENT_AMBULATORY_CARE_PROVIDER_SITE_OTHER): Payer: BC Managed Care – PPO

## 2020-07-22 ENCOUNTER — Other Ambulatory Visit: Payer: Self-pay

## 2020-07-22 DIAGNOSIS — I4892 Unspecified atrial flutter: Secondary | ICD-10-CM

## 2020-07-22 DIAGNOSIS — I480 Paroxysmal atrial fibrillation: Secondary | ICD-10-CM | POA: Diagnosis not present

## 2020-07-22 DIAGNOSIS — Z5181 Encounter for therapeutic drug level monitoring: Secondary | ICD-10-CM

## 2020-07-22 DIAGNOSIS — I050 Rheumatic mitral stenosis: Secondary | ICD-10-CM | POA: Diagnosis not present

## 2020-07-22 DIAGNOSIS — Z952 Presence of prosthetic heart valve: Secondary | ICD-10-CM

## 2020-07-22 LAB — POCT INR: INR: 3.6 — AB (ref 2.0–3.0)

## 2020-07-22 NOTE — Patient Instructions (Signed)
Continue with 1 tablet daily except 1.5 tablets on Mondays, Wednesday and Fridays. Recheck in 6 weeks

## 2020-07-23 ENCOUNTER — Other Ambulatory Visit: Payer: Self-pay | Admitting: Cardiology

## 2020-09-04 ENCOUNTER — Other Ambulatory Visit: Payer: Self-pay | Admitting: Cardiology

## 2020-09-16 ENCOUNTER — Ambulatory Visit (INDEPENDENT_AMBULATORY_CARE_PROVIDER_SITE_OTHER): Payer: BC Managed Care – PPO

## 2020-09-16 ENCOUNTER — Other Ambulatory Visit: Payer: Self-pay

## 2020-09-16 DIAGNOSIS — I4892 Unspecified atrial flutter: Secondary | ICD-10-CM

## 2020-09-16 DIAGNOSIS — Z5181 Encounter for therapeutic drug level monitoring: Secondary | ICD-10-CM | POA: Diagnosis not present

## 2020-09-16 DIAGNOSIS — I050 Rheumatic mitral stenosis: Secondary | ICD-10-CM | POA: Diagnosis not present

## 2020-09-16 DIAGNOSIS — I480 Paroxysmal atrial fibrillation: Secondary | ICD-10-CM | POA: Diagnosis not present

## 2020-09-16 DIAGNOSIS — Z952 Presence of prosthetic heart valve: Secondary | ICD-10-CM

## 2020-09-16 LAB — POCT INR: INR: 3.8 — AB (ref 2.0–3.0)

## 2020-09-16 NOTE — Patient Instructions (Signed)
Continue with 1 tablet daily except 1.5 tablets on Mondays, Wednesday and Fridays. Recheck in 6 weeks.  Eat greens over the next 2 days

## 2020-09-20 ENCOUNTER — Other Ambulatory Visit: Payer: Self-pay | Admitting: Cardiology

## 2020-09-21 NOTE — Telephone Encounter (Signed)
Looks like request is for 90 day supply, don't see any comments about manufacturer change. Ok to send in 90 day supply, pt adherent with INR checks.

## 2020-09-21 NOTE — Telephone Encounter (Signed)
I know that some pharmacies have stated that they are changing manufacturers I am not sure what the case is but it says change requested and I will route to the pharmd pool for clarification

## 2020-11-01 ENCOUNTER — Telehealth: Payer: Self-pay

## 2020-11-01 NOTE — Telephone Encounter (Signed)
LMOM FOR NEEDS INR OVERDUE APPT

## 2020-11-04 ENCOUNTER — Other Ambulatory Visit: Payer: Self-pay

## 2020-11-04 ENCOUNTER — Ambulatory Visit (INDEPENDENT_AMBULATORY_CARE_PROVIDER_SITE_OTHER): Payer: BC Managed Care – PPO

## 2020-11-04 DIAGNOSIS — I4892 Unspecified atrial flutter: Secondary | ICD-10-CM | POA: Diagnosis not present

## 2020-11-04 DIAGNOSIS — I050 Rheumatic mitral stenosis: Secondary | ICD-10-CM

## 2020-11-04 DIAGNOSIS — I480 Paroxysmal atrial fibrillation: Secondary | ICD-10-CM | POA: Diagnosis not present

## 2020-11-04 DIAGNOSIS — Z5181 Encounter for therapeutic drug level monitoring: Secondary | ICD-10-CM

## 2020-11-04 DIAGNOSIS — Z952 Presence of prosthetic heart valve: Secondary | ICD-10-CM

## 2020-11-04 LAB — POCT INR: INR: 4 — AB (ref 2.0–3.0)

## 2020-11-04 NOTE — Patient Instructions (Signed)
Hold today only and then decrease to 1 tablet daily except 1.5 tablets on Mondays and Fridays. Recheck in 3 weeks.  Eat greens over the next 2 days

## 2020-11-25 ENCOUNTER — Other Ambulatory Visit: Payer: Self-pay

## 2020-11-25 ENCOUNTER — Ambulatory Visit (INDEPENDENT_AMBULATORY_CARE_PROVIDER_SITE_OTHER): Payer: BC Managed Care – PPO

## 2020-11-25 DIAGNOSIS — Z952 Presence of prosthetic heart valve: Secondary | ICD-10-CM | POA: Diagnosis not present

## 2020-11-25 DIAGNOSIS — I480 Paroxysmal atrial fibrillation: Secondary | ICD-10-CM

## 2020-11-25 DIAGNOSIS — I050 Rheumatic mitral stenosis: Secondary | ICD-10-CM

## 2020-11-25 DIAGNOSIS — Z5181 Encounter for therapeutic drug level monitoring: Secondary | ICD-10-CM

## 2020-11-25 DIAGNOSIS — I4892 Unspecified atrial flutter: Secondary | ICD-10-CM

## 2020-11-25 LAB — POCT INR: INR: 3.5 — AB (ref 2.0–3.0)

## 2020-11-25 NOTE — Patient Instructions (Signed)
Continue 1 tablet daily except 1.5 tablets on Mondays and Fridays. Recheck in 6 weeks.

## 2020-12-25 ENCOUNTER — Other Ambulatory Visit: Payer: Self-pay | Admitting: Cardiology

## 2020-12-25 DIAGNOSIS — Z23 Encounter for immunization: Secondary | ICD-10-CM | POA: Diagnosis not present

## 2020-12-25 DIAGNOSIS — L719 Rosacea, unspecified: Secondary | ICD-10-CM | POA: Diagnosis not present

## 2020-12-25 DIAGNOSIS — Z872 Personal history of diseases of the skin and subcutaneous tissue: Secondary | ICD-10-CM | POA: Diagnosis not present

## 2020-12-26 NOTE — Telephone Encounter (Signed)
Prescription refill request received for warfarin [facility], 07/24/2019 Next INR check: 11/4 Warfarin tablet strength:  5mg    Pt overdue to see cardiologist, put on next INR check for pt to schedule appt with cardiologist and on refill. Will refill for a 1 month supply.

## 2021-01-06 ENCOUNTER — Other Ambulatory Visit: Payer: Self-pay

## 2021-01-06 ENCOUNTER — Ambulatory Visit (INDEPENDENT_AMBULATORY_CARE_PROVIDER_SITE_OTHER): Payer: BC Managed Care – PPO

## 2021-01-06 DIAGNOSIS — I480 Paroxysmal atrial fibrillation: Secondary | ICD-10-CM | POA: Diagnosis not present

## 2021-01-06 DIAGNOSIS — Z952 Presence of prosthetic heart valve: Secondary | ICD-10-CM | POA: Diagnosis not present

## 2021-01-06 DIAGNOSIS — I050 Rheumatic mitral stenosis: Secondary | ICD-10-CM | POA: Diagnosis not present

## 2021-01-06 DIAGNOSIS — Z5181 Encounter for therapeutic drug level monitoring: Secondary | ICD-10-CM

## 2021-01-06 DIAGNOSIS — I4892 Unspecified atrial flutter: Secondary | ICD-10-CM | POA: Diagnosis not present

## 2021-01-06 LAB — POCT INR: INR: 2.8 (ref 2.0–3.0)

## 2021-01-06 NOTE — Patient Instructions (Signed)
Increase to  1 tablet daily except 1.5 tablets on Monday, Wednesday and Friday. Recheck in 6 weeks.

## 2021-01-20 ENCOUNTER — Other Ambulatory Visit: Payer: Self-pay | Admitting: Cardiology

## 2021-01-28 IMAGING — DX DG CHEST 1V PORT
1 series · 1 of 1 positions shown · non-contrast
Comparison: 05/08/2017 and earlier.

CLINICAL DATA: Four-day history fatigue, dizziness and syncope.
Hypotension and tachycardia upon emergency department arrival.

EXAM:
PORTABLE CHEST 1 VIEW

[chest ap]
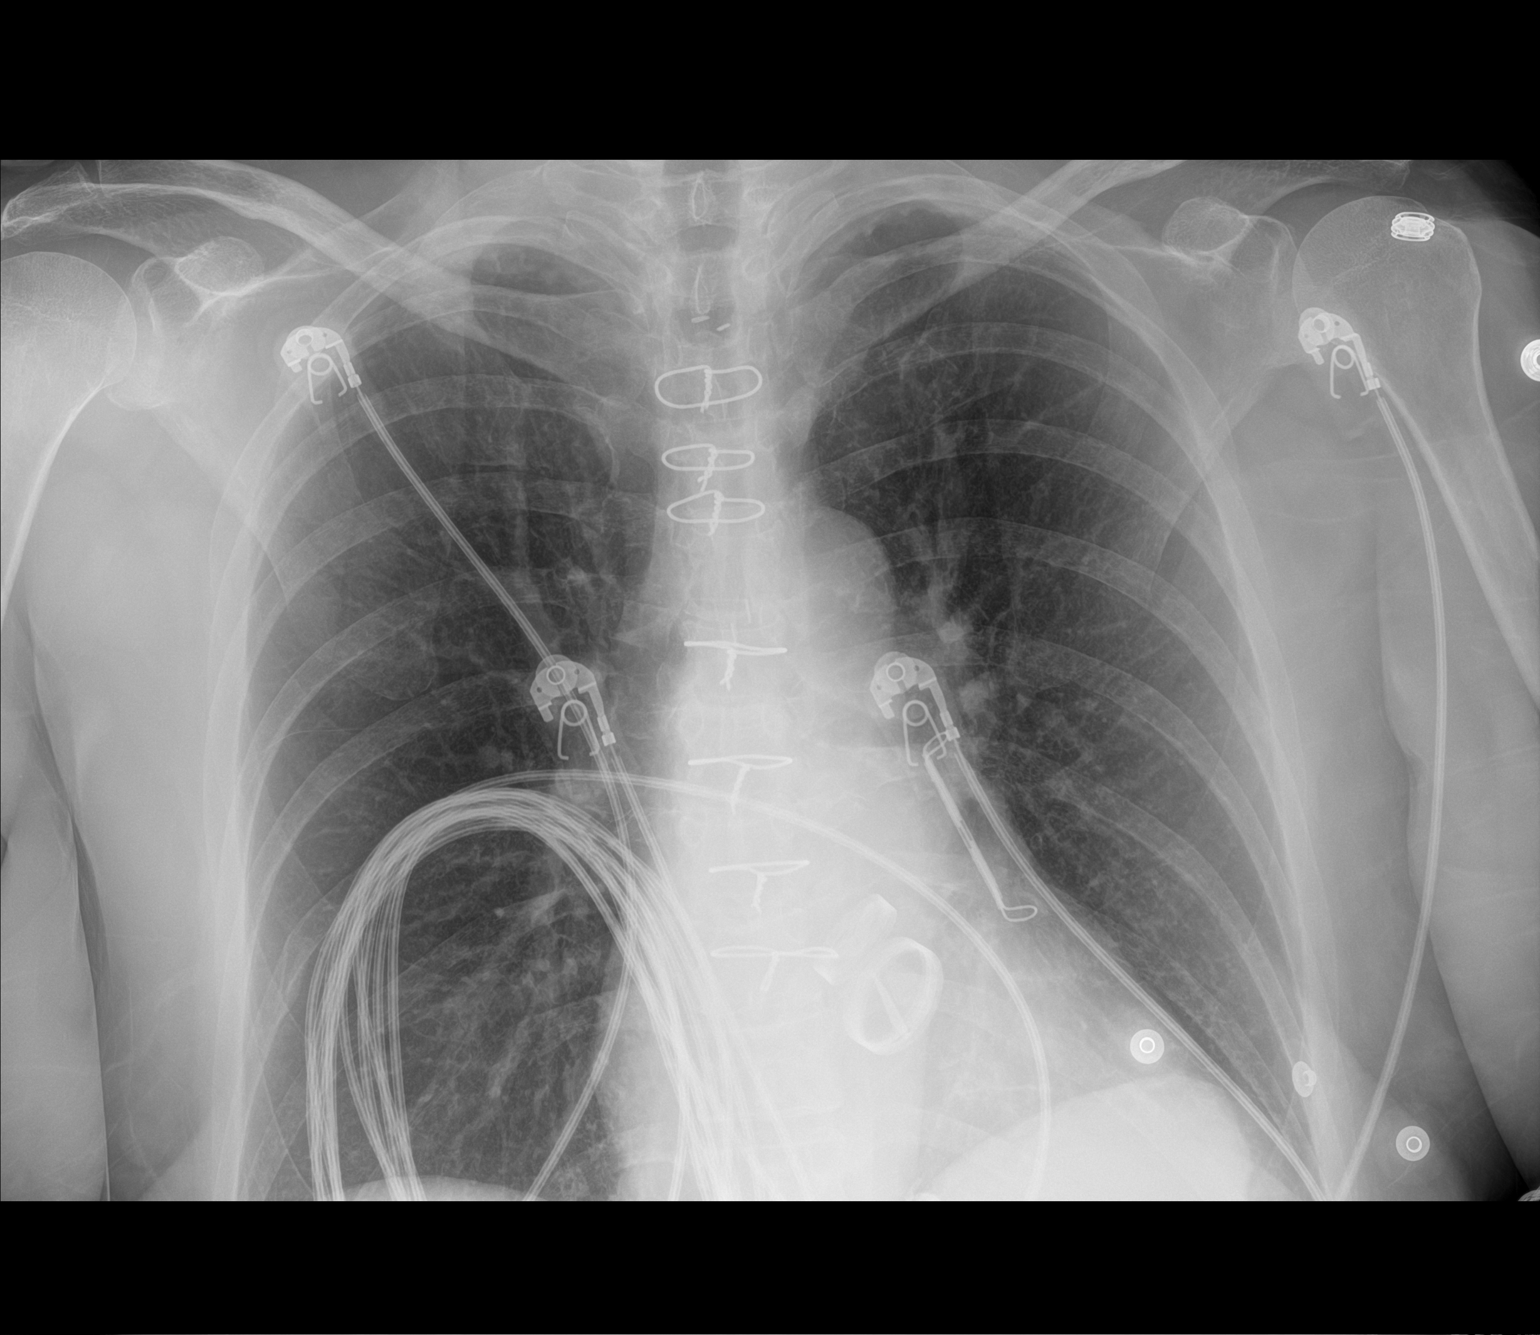

[1 of 1 positions shown; findings below may reference images not displayed]

FINDINGS: Prior sternotomy for aortic and mitral valve replacement. Prior LEFT
atrial appendage clipping. Cardiac silhouette upper normal in size,
unchanged. Lungs clear. Bronchovascular markings normal. Pulmonary
vascularity normal. No visible pleural effusions. No pneumothorax.
IMPRESSION: No acute cardiopulmonary disease.

## 2021-02-03 ENCOUNTER — Other Ambulatory Visit: Payer: Self-pay | Admitting: Cardiology

## 2021-02-07 NOTE — Progress Notes (Signed)
HPI: Follow-up atrial fibrillation and MVR/AVR.  Patient admitted January 2019 with new onset atrial fibrillation and congestive heart failure.  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  Preoperative cardiac catheterization showed normal coronary arteries.  Preoperative carotid Dopplers showed no stenosis.  Patient subsequently underwent aortic valve replacement and mitral valve replacement with mechanical valves and a Maze procedure with ligation of left atrial appendage. Echocardiogram September 2020 showed ejection fraction 45 to 50%, severe left atrial enlargement, status post mitral valve replacement with mean gradient 7 mmHg, prior aortic valve replacement with mean gradient 21 mmHg and trace aortic insufficiency.  GI bleed September 2020 and found to have ulcer and polyps.  Since last seen,   Current Outpatient Medications  Medication Sig Dispense Refill   acetaminophen (TYLENOL) 325 MG tablet Take 2 tablets (650 mg total) by mouth every 6 (six) hours as needed for mild pain.     aspirin EC 81 MG tablet Take 1 tablet (81 mg total) by mouth daily. 90 tablet 3   pantoprazole (PROTONIX) 40 MG tablet Take 1 tablet (40 mg total) by mouth 3 (three) times a week. 60 tablet 1   warfarin (COUMADIN) 5 MG tablet TAKE 1 TO 1&1/2 TABLETS BY MOUTH DAILY AS DIRECTED BY THE COUMADIN CLINIC. 120 tablet 1   No current facility-administered medications for this visit.     Past Medical History:  Diagnosis Date   Mitral valve disorder    "leaky" valve diagnosed 30+ yr ago    Past Surgical History:  Procedure Laterality Date   AORTIC VALVE REPLACEMENT N/A 04/04/2017   Procedure: AORTIC VALVE REPLACEMENT (AVR);  Surgeon: Alleen Borne, MD;  Location: [facility] OR;  Service: Open Heart Surgery;  Laterality: N/A;   BIOPSY   11/21/2018   Procedure: BIOPSY;  Surgeon: Kerin Salen, MD;  Location: [facility] ENDOSCOPY;  Service: Gastroenterology;;   CARDIAC SURGERY     COLONOSCOPY WITH PROPOFOL N/A 11/21/2018   Procedure: COLONOSCOPY WITH PROPOFOL;  Surgeon: Kerin Salen, MD;  Location: [facility] ENDOSCOPY;  Service: Gastroenterology;  Laterality: N/A;   ESOPHAGOGASTRODUODENOSCOPY N/A 11/20/2018   Procedure: ESOPHAGOGASTRODUODENOSCOPY (EGD);  Surgeon: Kerin Salen, MD;  Location: [facility] ENDOSCOPY;  Service: Gastroenterology;  Laterality: N/A;   HOT HEMOSTASIS N/A 11/20/2018   Procedure: HOT HEMOSTASIS (ARGON PLASMA COAGULATION/BICAP);  Surgeon: Kerin Salen, MD;  Location: [facility] ENDOSCOPY;  Service: Gastroenterology;  Laterality: N/A;   MAZE N/A 04/04/2017   Procedure: MAZE;  Surgeon: Alleen Borne, MD;  Location: [facility] OR;  Service: Open Heart Surgery;  Laterality: N/A;   MITRAL VALVE REPLACEMENT N/A 04/04/2017   Procedure: MITRAL VALVE (MV) REPLACEMENT;  Surgeon: Alleen Borne, MD;  Location: [facility] OR;  Service: Open Heart Surgery;  Laterality: N/A;   No prior surgery     POLYPECTOMY  11/21/2018   Procedure: POLYPECTOMY;  Surgeon: Kerin Salen, MD;  Location: [facility] ENDOSCOPY;  Service: Gastroenterology;;   RIGHT/LEFT HEART CATH AND CORONARY ANGIOGRAPHY N/A 04/01/2017   Procedure: RIGHT/LEFT HEART CATH AND CORONARY ANGIOGRAPHY;  Surgeon: Runell Gess, MD;  Location: [facility] INVASIVE CV LAB;  Service: Cardiovascular;  Laterality: N/A;   TEE WITHOUT CARDIOVERSION N/A 04/04/2017   Procedure: TRANSESOPHAGEAL ECHOCARDIOGRAM (TEE);  Surgeon: Alleen Borne, MD;  Location: [facility] OR;  Service: Open Heart Surgery;  Laterality: N/A;    Social History   Socioeconomic History   Marital status: Married  Spouse name: Not on file   Number of children: 3   Years of education: Not on file   Highest education level: Not on file  Occupational History   Not on file  Tobacco Use   Smoking status: Former    Packs/day: 1.00    Types: Cigarettes    Quit date:  03/30/2017    Years since quitting: 3.8   Smokeless tobacco: Never  Vaping Use   Vaping Use: Never used  Substance and Sexual Activity   Alcohol use: No   Drug use: No   Sexual activity: Not on file  Other Topics Concern   Not on file  Social History Narrative   Not on file   Social Determinants of Health   Financial Resource Strain: Not on file  Food Insecurity: Not on file  Transportation Needs: Not on file  Physical Activity: Not on file  Stress: Not on file  Social Connections: Not on file  Intimate Partner Violence: Not on file    Family History  Problem Relation Age of Onset   CVA Father    Heart attack Father     ROS: no fevers or chills, productive cough, hemoptysis, dysphasia, odynophagia, melena, hematochezia, dysuria, hematuria, rash, seizure activity, orthopnea, PND, pedal edema, claudication. Remaining systems are negative.  Physical Exam: Well-developed well-nourished in no acute distress.  Skin is warm and dry.  HEENT is normal.  Neck is supple.  Chest is clear to auscultation with normal expansion.  Cardiovascular exam is regular rate and rhythm.  Abdominal exam nontender or distended. No masses palpated. Extremities show no edema. neuro grossly intact  ECG-normal sinus rhythm at a rate of 82, no ST changes.  Personally reviewed  A/P  1 previous aortic and mitral valve replacement-continue Coumadin as well as aspirin 81 mg daily.  Continue SBE prophylaxis.   2 paroxysmal atrial fibrillation-patient had Maze procedure at time of previous valve surgery.  Continue Coumadin.   3 chronic combined systolic/diastolic congestive heart failure-she is doing well from a symptomatic standpoint and denies increased dyspnea or lower extremity edema.  We will continue fluid restriction and low-sodium diet. Repeat echo to reassess LV function.  I will add Toprol 25 mg daily.  Kirk Ruths, MD

## 2021-02-14 ENCOUNTER — Other Ambulatory Visit: Payer: Self-pay

## 2021-02-14 ENCOUNTER — Encounter: Payer: Self-pay | Admitting: Cardiology

## 2021-02-14 ENCOUNTER — Ambulatory Visit (INDEPENDENT_AMBULATORY_CARE_PROVIDER_SITE_OTHER): Payer: BC Managed Care – PPO | Admitting: *Deleted

## 2021-02-14 ENCOUNTER — Ambulatory Visit (INDEPENDENT_AMBULATORY_CARE_PROVIDER_SITE_OTHER): Payer: BC Managed Care – PPO | Admitting: Cardiology

## 2021-02-14 VITALS — BP 122/74 | HR 82 | Ht 66.5 in | Wt 191.0 lb

## 2021-02-14 DIAGNOSIS — I4892 Unspecified atrial flutter: Secondary | ICD-10-CM | POA: Diagnosis not present

## 2021-02-14 DIAGNOSIS — Z5181 Encounter for therapeutic drug level monitoring: Secondary | ICD-10-CM

## 2021-02-14 DIAGNOSIS — I420 Dilated cardiomyopathy: Secondary | ICD-10-CM

## 2021-02-14 DIAGNOSIS — Z952 Presence of prosthetic heart valve: Secondary | ICD-10-CM

## 2021-02-14 DIAGNOSIS — I480 Paroxysmal atrial fibrillation: Secondary | ICD-10-CM

## 2021-02-14 LAB — POCT INR: INR: 3.4 — AB (ref 2.0–3.0)

## 2021-02-14 MED ORDER — METOPROLOL SUCCINATE ER 25 MG PO TB24: 25.0000 mg | ORAL_TABLET | Freq: Every day | ORAL | 3 refills | Status: DC

## 2021-02-14 NOTE — Patient Instructions (Signed)
Medication Instructions:   START METOPROLOL 25 MG ONCE DAILY  *If you need a refill on your cardiac medications before your next appointment, please call your pharmacy*   Testing/Procedures:  Your physician has requested that you have an echocardiogram. Echocardiography is a painless test that uses sound waves to create images of your heart. It provides your doctor with information about the size and shape of your heart and how well your heart's chambers and valves are working. This procedure takes approximately one hour. There are no restrictions for this procedure. [address]   Follow-Up: At [facility], you and your health needs are our priority.  As part of our continuing mission to provide you with exceptional heart care, we have created designated Provider Care Teams.  These Care Teams include your primary Cardiologist (physician) and Advanced Practice Providers (APPs -  Physician Assistants and Nurse Practitioners) who all work together to provide you with the care you need, when you need it.  We recommend signing up for the patient portal called "MyChart".  Sign up information is provided on this After Visit Summary.  MyChart is used to connect with patients for Virtual Visits (Telemedicine).  Patients are able to view lab/test results, encounter notes, upcoming appointments, etc.  Non-urgent messages can be sent to your provider as well.   To learn more about what you can do with MyChart, go to [email or web address].    Your next appointment:   12 month(s)  The format for your next appointment:   In Person  Provider:   Olga Millers, MD

## 2021-02-14 NOTE — Patient Instructions (Addendum)
Description   Continue taking 1 tablet daily except 1.5 tablets on Monday, Wednesday and Friday. Recheck in 6 weeks.

## 2021-03-07 ENCOUNTER — Other Ambulatory Visit: Payer: Self-pay

## 2021-03-07 ENCOUNTER — Ambulatory Visit (HOSPITAL_COMMUNITY): Payer: BC Managed Care – PPO | Attending: Cardiovascular Disease

## 2021-03-07 ENCOUNTER — Encounter: Payer: Self-pay | Admitting: *Deleted

## 2021-03-07 DIAGNOSIS — Z952 Presence of prosthetic heart valve: Secondary | ICD-10-CM | POA: Diagnosis not present

## 2021-03-07 LAB — ECHOCARDIOGRAM COMPLETE
AR max vel: 1.41 cm2
AV Area VTI: 1.37 cm2
AV Area mean vel: 1.36 cm2
AV Mean grad: 14.5 mmHg
AV Peak grad: 30.3 mmHg
Ao pk vel: 2.75 m/s
Area-P 1/2: 2.59 cm2
P 1/2 time: 463 ms
S' Lateral: 3.7 cm

## 2021-03-24 ENCOUNTER — Ambulatory Visit (INDEPENDENT_AMBULATORY_CARE_PROVIDER_SITE_OTHER): Payer: BC Managed Care – PPO

## 2021-03-24 ENCOUNTER — Other Ambulatory Visit: Payer: Self-pay

## 2021-03-24 DIAGNOSIS — I050 Rheumatic mitral stenosis: Secondary | ICD-10-CM

## 2021-03-24 DIAGNOSIS — Z952 Presence of prosthetic heart valve: Secondary | ICD-10-CM

## 2021-03-24 DIAGNOSIS — I480 Paroxysmal atrial fibrillation: Secondary | ICD-10-CM | POA: Diagnosis not present

## 2021-03-24 DIAGNOSIS — Z5181 Encounter for therapeutic drug level monitoring: Secondary | ICD-10-CM | POA: Diagnosis not present

## 2021-03-24 DIAGNOSIS — I4892 Unspecified atrial flutter: Secondary | ICD-10-CM

## 2021-03-24 LAB — POCT INR: INR: 4.3 — AB (ref 2.0–3.0)

## 2021-03-24 NOTE — Patient Instructions (Signed)
HOLD TONIGHT ONLY and then Continue taking 1 tablet daily except 1.5 tablets on Monday, Wednesday and Friday. Recheck in 5 weeks

## 2021-04-24 ENCOUNTER — Ambulatory Visit (INDEPENDENT_AMBULATORY_CARE_PROVIDER_SITE_OTHER): Payer: BC Managed Care – PPO

## 2021-04-24 ENCOUNTER — Other Ambulatory Visit: Payer: Self-pay

## 2021-04-24 DIAGNOSIS — Z952 Presence of prosthetic heart valve: Secondary | ICD-10-CM | POA: Diagnosis not present

## 2021-04-24 DIAGNOSIS — I4892 Unspecified atrial flutter: Secondary | ICD-10-CM | POA: Diagnosis not present

## 2021-04-24 DIAGNOSIS — I480 Paroxysmal atrial fibrillation: Secondary | ICD-10-CM | POA: Diagnosis not present

## 2021-04-24 DIAGNOSIS — I050 Rheumatic mitral stenosis: Secondary | ICD-10-CM | POA: Diagnosis not present

## 2021-04-24 DIAGNOSIS — Z5181 Encounter for therapeutic drug level monitoring: Secondary | ICD-10-CM

## 2021-04-24 LAB — POCT INR: INR: 2.8 (ref 2.0–3.0)

## 2021-04-24 NOTE — Patient Instructions (Signed)
TAKE 2 TABLETS TONIGHT and then Continue taking 1 tablet daily except 1.5 tablets on Monday, Wednesday and Friday. Recheck in 5 weeks.

## 2021-06-02 ENCOUNTER — Ambulatory Visit (INDEPENDENT_AMBULATORY_CARE_PROVIDER_SITE_OTHER): Payer: BC Managed Care – PPO

## 2021-06-02 DIAGNOSIS — I480 Paroxysmal atrial fibrillation: Secondary | ICD-10-CM | POA: Diagnosis not present

## 2021-06-02 DIAGNOSIS — I050 Rheumatic mitral stenosis: Secondary | ICD-10-CM

## 2021-06-02 DIAGNOSIS — Z952 Presence of prosthetic heart valve: Secondary | ICD-10-CM | POA: Diagnosis not present

## 2021-06-02 DIAGNOSIS — Z5181 Encounter for therapeutic drug level monitoring: Secondary | ICD-10-CM | POA: Diagnosis not present

## 2021-06-02 DIAGNOSIS — I4892 Unspecified atrial flutter: Secondary | ICD-10-CM

## 2021-06-02 LAB — POCT INR: INR: 2.6 (ref 2.0–3.0)

## 2021-06-02 NOTE — Patient Instructions (Signed)
TAKE 2 TABLETS TODAY ONLY and then Continue taking 1 tablet daily except 1.5 tablets on Monday, Wednesday and Friday. Recheck in 4 weeks.    ?

## 2021-06-30 ENCOUNTER — Ambulatory Visit (INDEPENDENT_AMBULATORY_CARE_PROVIDER_SITE_OTHER): Payer: BC Managed Care – PPO

## 2021-06-30 DIAGNOSIS — I050 Rheumatic mitral stenosis: Secondary | ICD-10-CM | POA: Diagnosis not present

## 2021-06-30 DIAGNOSIS — I480 Paroxysmal atrial fibrillation: Secondary | ICD-10-CM | POA: Diagnosis not present

## 2021-06-30 DIAGNOSIS — I4892 Unspecified atrial flutter: Secondary | ICD-10-CM

## 2021-06-30 DIAGNOSIS — Z952 Presence of prosthetic heart valve: Secondary | ICD-10-CM | POA: Diagnosis not present

## 2021-06-30 DIAGNOSIS — Z5181 Encounter for therapeutic drug level monitoring: Secondary | ICD-10-CM | POA: Diagnosis not present

## 2021-06-30 LAB — POCT INR: INR: 4.6 — AB (ref 2.0–3.0)

## 2021-06-30 NOTE — Patient Instructions (Signed)
HOLD TONIGHT ONLY and then Continue taking 1 tablet daily except 1.5 tablets on Monday, Wednesday and Friday. Recheck in 5 weeks °

## 2021-08-03 DIAGNOSIS — L718 Other rosacea: Secondary | ICD-10-CM | POA: Diagnosis not present

## 2021-08-04 ENCOUNTER — Ambulatory Visit (INDEPENDENT_AMBULATORY_CARE_PROVIDER_SITE_OTHER): Payer: BC Managed Care – PPO

## 2021-08-04 DIAGNOSIS — I480 Paroxysmal atrial fibrillation: Secondary | ICD-10-CM

## 2021-08-04 DIAGNOSIS — Z5181 Encounter for therapeutic drug level monitoring: Secondary | ICD-10-CM

## 2021-08-04 DIAGNOSIS — Z952 Presence of prosthetic heart valve: Secondary | ICD-10-CM | POA: Diagnosis not present

## 2021-08-04 DIAGNOSIS — I4892 Unspecified atrial flutter: Secondary | ICD-10-CM | POA: Diagnosis not present

## 2021-08-04 DIAGNOSIS — I050 Rheumatic mitral stenosis: Secondary | ICD-10-CM

## 2021-08-04 LAB — POCT INR: INR: 1.9 — AB (ref 2.0–3.0)

## 2021-08-04 NOTE — Patient Instructions (Signed)
TAKE 2 TABLETS TODAY ONLY  and then Continue taking 1 tablet daily except 1.5 tablets on Monday, Wednesday and Friday. Recheck in 2 weeks. Doxycycline started 6/1.

## 2021-08-18 ENCOUNTER — Ambulatory Visit (INDEPENDENT_AMBULATORY_CARE_PROVIDER_SITE_OTHER): Payer: BC Managed Care – PPO | Admitting: *Deleted

## 2021-08-18 DIAGNOSIS — Z952 Presence of prosthetic heart valve: Secondary | ICD-10-CM | POA: Diagnosis not present

## 2021-08-18 DIAGNOSIS — I4892 Unspecified atrial flutter: Secondary | ICD-10-CM

## 2021-08-18 DIAGNOSIS — Z5181 Encounter for therapeutic drug level monitoring: Secondary | ICD-10-CM

## 2021-08-18 DIAGNOSIS — I480 Paroxysmal atrial fibrillation: Secondary | ICD-10-CM | POA: Diagnosis not present

## 2021-08-18 LAB — POCT INR: INR: 3.7 — AB (ref 2.0–3.0)

## 2021-08-18 NOTE — Patient Instructions (Signed)
Description   Take 1 tablet of warfarin today and then continue taking 1 tablet daily except 1.5 tablets on Monday, Wednesday and Friday. Recheck in 3 weeks.   Pt started doxy on 6/2 (20mg  daily). Pt thinks she should be finished taking it but is going to call the  dermatologist today (who prescribed). Please call clinic if you are continuing to take it.

## 2021-09-08 ENCOUNTER — Ambulatory Visit (INDEPENDENT_AMBULATORY_CARE_PROVIDER_SITE_OTHER): Payer: BC Managed Care – PPO | Admitting: *Deleted

## 2021-09-08 DIAGNOSIS — Z5181 Encounter for therapeutic drug level monitoring: Secondary | ICD-10-CM | POA: Diagnosis not present

## 2021-09-08 DIAGNOSIS — Z952 Presence of prosthetic heart valve: Secondary | ICD-10-CM | POA: Diagnosis not present

## 2021-09-08 DIAGNOSIS — I480 Paroxysmal atrial fibrillation: Secondary | ICD-10-CM

## 2021-09-08 DIAGNOSIS — I050 Rheumatic mitral stenosis: Secondary | ICD-10-CM

## 2021-09-08 DIAGNOSIS — I4892 Unspecified atrial flutter: Secondary | ICD-10-CM | POA: Diagnosis not present

## 2021-09-08 LAB — POCT INR: INR: 3.8 — AB (ref 2.0–3.0)

## 2021-09-08 NOTE — Patient Instructions (Addendum)
Description   Take 1 tablet of warfarin today and then continue taking 1 tablet daily except 1.5 tablets on Monday, Wednesday and Friday. Recheck in 4 weeks. Anticoagulation Clinic [phone]

## 2021-09-14 ENCOUNTER — Other Ambulatory Visit: Payer: Self-pay | Admitting: Cardiology

## 2021-09-14 NOTE — Telephone Encounter (Signed)
Received refill request for warfarin:  Last INR was 3.8 on 09/08/21 Next INR due on 10/06/21 LOV was 02/14/21  Velta Addison MD  Refill approved.

## 2021-10-16 ENCOUNTER — Ambulatory Visit (INDEPENDENT_AMBULATORY_CARE_PROVIDER_SITE_OTHER): Payer: BC Managed Care – PPO

## 2021-10-16 DIAGNOSIS — I4892 Unspecified atrial flutter: Secondary | ICD-10-CM | POA: Diagnosis not present

## 2021-10-16 DIAGNOSIS — Z952 Presence of prosthetic heart valve: Secondary | ICD-10-CM | POA: Diagnosis not present

## 2021-10-16 DIAGNOSIS — Z5181 Encounter for therapeutic drug level monitoring: Secondary | ICD-10-CM

## 2021-10-16 DIAGNOSIS — I480 Paroxysmal atrial fibrillation: Secondary | ICD-10-CM | POA: Diagnosis not present

## 2021-10-16 LAB — POCT INR: INR: 1.9 — AB (ref 2.0–3.0)

## 2021-10-16 NOTE — Patient Instructions (Signed)
Description   Take 2.5 tablets today and then continue taking 1 tablet daily except 1.5 tablets on Monday, Wednesday and Friday. Recheck in 3 weeks. Anticoagulation Clinic [phone]

## 2021-11-07 ENCOUNTER — Ambulatory Visit: Payer: BC Managed Care – PPO | Attending: Cardiology

## 2021-11-07 DIAGNOSIS — Z5181 Encounter for therapeutic drug level monitoring: Secondary | ICD-10-CM | POA: Diagnosis not present

## 2021-11-07 DIAGNOSIS — Z952 Presence of prosthetic heart valve: Secondary | ICD-10-CM | POA: Diagnosis not present

## 2021-11-07 DIAGNOSIS — I480 Paroxysmal atrial fibrillation: Secondary | ICD-10-CM

## 2021-11-07 DIAGNOSIS — I050 Rheumatic mitral stenosis: Secondary | ICD-10-CM | POA: Diagnosis not present

## 2021-11-07 DIAGNOSIS — I4892 Unspecified atrial flutter: Secondary | ICD-10-CM | POA: Diagnosis not present

## 2021-11-07 LAB — POCT INR: INR: 4.5 — AB (ref 2.0–3.0)

## 2021-11-07 NOTE — Patient Instructions (Signed)
HOLD today and then continue taking 1 tablet daily except 1.5 tablets on Monday, Wednesday and Friday. Recheck in 3 weeks (per pt request. Made aware of risk). Anticoagulation Clinic [phone]

## 2021-12-01 ENCOUNTER — Ambulatory Visit: Payer: BC Managed Care – PPO

## 2021-12-11 ENCOUNTER — Ambulatory Visit: Payer: BC Managed Care – PPO | Attending: Cardiovascular Disease

## 2021-12-11 DIAGNOSIS — I480 Paroxysmal atrial fibrillation: Secondary | ICD-10-CM

## 2021-12-11 DIAGNOSIS — I050 Rheumatic mitral stenosis: Secondary | ICD-10-CM | POA: Diagnosis not present

## 2021-12-11 DIAGNOSIS — Z5181 Encounter for therapeutic drug level monitoring: Secondary | ICD-10-CM | POA: Diagnosis not present

## 2021-12-11 DIAGNOSIS — I4892 Unspecified atrial flutter: Secondary | ICD-10-CM | POA: Diagnosis not present

## 2021-12-11 DIAGNOSIS — Z952 Presence of prosthetic heart valve: Secondary | ICD-10-CM

## 2021-12-11 LAB — POCT INR: INR: 3.1 — AB (ref 2.0–3.0)

## 2021-12-11 NOTE — Patient Instructions (Signed)
continue taking 1 tablet daily except 1.5 tablets on Monday, Wednesday and Friday. Recheck in 5 weeks (per pt request. Made aware of risk). Anticoagulation Clinic [phone]

## 2022-01-24 ENCOUNTER — Ambulatory Visit: Payer: BC Managed Care – PPO | Attending: Cardiology

## 2022-01-24 DIAGNOSIS — Z5181 Encounter for therapeutic drug level monitoring: Secondary | ICD-10-CM

## 2022-01-24 DIAGNOSIS — I4892 Unspecified atrial flutter: Secondary | ICD-10-CM | POA: Diagnosis not present

## 2022-01-24 DIAGNOSIS — I480 Paroxysmal atrial fibrillation: Secondary | ICD-10-CM

## 2022-01-24 DIAGNOSIS — Z952 Presence of prosthetic heart valve: Secondary | ICD-10-CM | POA: Diagnosis not present

## 2022-01-24 LAB — POCT INR: INR: 3 (ref 2.0–3.0)

## 2022-01-24 NOTE — Patient Instructions (Signed)
Description   Continue taking 1 tablet daily except 1.5 tablets on Monday, Wednesday and Friday. Recheck in 6 weeks.  Anticoagulation Clinic [phone]

## 2022-03-09 ENCOUNTER — Ambulatory Visit: Payer: BC Managed Care – PPO | Attending: Cardiology

## 2022-03-09 DIAGNOSIS — Z5181 Encounter for therapeutic drug level monitoring: Secondary | ICD-10-CM | POA: Diagnosis not present

## 2022-03-09 DIAGNOSIS — I4892 Unspecified atrial flutter: Secondary | ICD-10-CM | POA: Diagnosis not present

## 2022-03-09 DIAGNOSIS — I480 Paroxysmal atrial fibrillation: Secondary | ICD-10-CM | POA: Diagnosis not present

## 2022-03-09 DIAGNOSIS — I050 Rheumatic mitral stenosis: Secondary | ICD-10-CM

## 2022-03-09 DIAGNOSIS — Z952 Presence of prosthetic heart valve: Secondary | ICD-10-CM

## 2022-03-09 LAB — POCT INR: INR: 4.3 — AB (ref 2.0–3.0)

## 2022-03-09 NOTE — Patient Instructions (Signed)
HOLD TODAY ONLY THEN Continue taking 1 tablet daily except 1.5 tablets on Monday, Wednesday and Friday. Recheck in 4 weeks.  Anticoagulation Clinic [phone]

## 2022-03-17 ENCOUNTER — Other Ambulatory Visit: Payer: Self-pay | Admitting: Cardiology

## 2022-04-06 ENCOUNTER — Ambulatory Visit: Payer: BC Managed Care – PPO | Attending: Cardiovascular Disease

## 2022-04-06 DIAGNOSIS — I4892 Unspecified atrial flutter: Secondary | ICD-10-CM

## 2022-04-06 DIAGNOSIS — I480 Paroxysmal atrial fibrillation: Secondary | ICD-10-CM | POA: Diagnosis not present

## 2022-04-06 DIAGNOSIS — Z952 Presence of prosthetic heart valve: Secondary | ICD-10-CM

## 2022-04-06 DIAGNOSIS — Z5181 Encounter for therapeutic drug level monitoring: Secondary | ICD-10-CM | POA: Diagnosis not present

## 2022-04-06 DIAGNOSIS — I050 Rheumatic mitral stenosis: Secondary | ICD-10-CM

## 2022-04-06 LAB — POCT INR: INR: 2.6 (ref 2.0–3.0)

## 2022-04-06 NOTE — Patient Instructions (Signed)
TAKE 2 TABLETS TODAY ONLY THEN  Continue taking 1 tablet daily except 1.5 tablets on Monday, Wednesday and Friday. Recheck in 3 weeks.  Anticoagulation Clinic [phone]

## 2022-04-19 ENCOUNTER — Other Ambulatory Visit: Payer: Self-pay | Admitting: Cardiology

## 2022-04-19 DIAGNOSIS — I480 Paroxysmal atrial fibrillation: Secondary | ICD-10-CM

## 2022-04-19 NOTE — Progress Notes (Signed)
Cardiology Clinic Note   Patient Name: Sharon Figueroa Date of Encounter: 04/20/2022  Primary Care Provider:  Patient, No Pcp Per Primary Cardiologist:  Kirk Ruths, MD  Patient Profile    Sharon Figueroa 62 year old female presents today for follow-up of her atrial fibrillation and mitral stenosis.  Past Medical History    Past Medical History:  Diagnosis Date   Mitral valve disorder    "leaky" valve diagnosed 30+ yr ago   Past Surgical History:  Procedure Laterality Date   AORTIC VALVE REPLACEMENT N/A 04/04/2017   Procedure: AORTIC VALVE REPLACEMENT (AVR);  Surgeon: Gaye Pollack, MD;  Location: [place];  Service: Open Heart Surgery;  Laterality: N/A;   BIOPSY  11/21/2018   Procedure: BIOPSY;  Surgeon: [name], MD;  Location: [facility] ENDOSCOPY;  Service: Gastroenterology;;   CARDIAC SURGERY     COLONOSCOPY WITH PROPOFOL N/A 11/21/2018   Procedure: COLONOSCOPY WITH PROPOFOL;  Surgeon: [name], MD;  Location: [place];  Service: Gastroenterology;  Laterality: N/A;   ESOPHAGOGASTRODUODENOSCOPY N/A 11/20/2018   Procedure: ESOPHAGOGASTRODUODENOSCOPY (EGD);  Surgeon: [name], MD;  Location: [place];  Service: Gastroenterology;  Laterality: N/A;   HOT HEMOSTASIS N/A 11/20/2018   Procedure: HOT HEMOSTASIS (ARGON PLASMA COAGULATION/BICAP);  Surgeon: [name], MD;  Location: [place];  Service: Gastroenterology;  Laterality: N/A;   MAZE N/A 04/04/2017   Procedure: MAZE;  Surgeon: Gaye Pollack, MD;  Location: [place];  Service: Open Heart Surgery;  Laterality: N/A;   MITRAL VALVE REPLACEMENT N/A 04/04/2017   Procedure: MITRAL VALVE (MV) REPLACEMENT;  Surgeon: Gaye Pollack, MD;  Location: [place];  Service: Open Heart Surgery;  Laterality: N/A;   No prior surgery     POLYPECTOMY  11/21/2018   Procedure: POLYPECTOMY;  Surgeon: [name], MD;  Location: [facility] ENDOSCOPY;  Service: Gastroenterology;;   RIGHT/LEFT HEART CATH AND CORONARY ANGIOGRAPHY N/A 04/01/2017    Procedure: RIGHT/LEFT HEART CATH AND CORONARY ANGIOGRAPHY;  Surgeon: Lorretta Harp, MD;  Location: [place] CV LAB;  Service: Cardiovascular;  Laterality: N/A;   TEE WITHOUT CARDIOVERSION N/A 04/04/2017   Procedure: TRANSESOPHAGEAL ECHOCARDIOGRAM (TEE);  Surgeon: Gaye Pollack, MD;  Location: [place];  Service: Open Heart Surgery;  Laterality: N/A;    Allergies  No Known Allergies  History of Present Illness    Sharon Figueroa has a PMH of atrial fibrillation and MVR/AVR.  She was admitted 1/19 with new onset atrial fibrillation congestive heart failure.  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  Her preoperative cardiac catheterization showed normal coronary arteries.  Postoperative carotid Dopplers showed no stenosis.  She underwent aortic valve replacement and mitral valve replacement with mechanical valves and a Maze procedure with ligation of left atrial appendage.  Her echocardiogram 9/20 showed an ejection fraction of 45-50%, severe left atrial enlargement, status post mitral valve replacement with a mean gradient 7 mmHg, post aortic valve replacement with mean gradient 21 mmHg and trace aortic insufficiency.  She is noted to have GI bleed 9/20 and was found to have ulcer and polyps.  She was last seen by Dr. Stanford Breed on 01/05/2019.  During that time she was noted to have dyspnea with more vigorous activities but not with routine activities.  She denied orthopnea PND lower extremity edema, chest pain, syncope, and bleeding.   She presented to the clinic 07/24/2019 for follow-up evaluation and  stated she felt well.  She did have some dyspnea with increased physical activities such as climbing stairs or walking her dog.  However, her breathing was normal with normal activities.  She had been walking her new dog for 20  minutes 4-5 times a week.  I will ordered a CBC, gave salty 6, and planned follow-up in 1 year.  She was seen in follow-up by Dr. Stanford Breed on 02/14/2021.  Her EKG showed normal sinus rhythm no ST changes 82 bpm.  Her Coumadin for previous aortic and mitral valve replacement as well as aspirin and SBE prophylaxis were continued.  She remained stable from a cardiac standpoint.  She denied lower extremity swelling and dyspnea.  Fluid restriction was continued her echocardiogram on 323 showed an EF of 40-45%, well-functioning mitral valve with mild mitral valve stenosis well-functioning aortic valve with mild aortic valve stenosis.  She presents to the clinic today for follow-up evaluation and states she does notice some shortness of breath with increased physical activity such as walking up stairs.  She continues to work at home and reports that she has gained some weight.  She plans to be more physically active when the weather becomes nicer.  Her blood pressure initially today is 144/96 and on recheck is 132/82.  We reviewed her previous echocardiogram and she expressed understanding.  I will continue her current medication regimen, order CBC, BMP, have her increase her physical activity as tolerated and give the salty 6 diet sheet.  We will plan follow-up in 1 year.   Today she denies chest pain,  lower extremity edema, fatigue, palpitations, melena, hematuria, hemoptysis, diaphoresis, weakness, presyncope, syncope, orthopnea, and PND.     Home Medications    Prior to Admission medications   Medication Sig Start Date End Date Taking? Authorizing Provider  acetaminophen (TYLENOL) 325 MG tablet Take 2 tablets (650 mg total) by mouth every 6 (six) hours as needed for mild pain. 04/09/17   Nani Skillern, PA-C  aspirin EC 81 MG tablet Take 1 tablet (81 mg total) by mouth daily. 01/05/19   Lelon Perla, MD  metoprolol succinate (TOPROL XL) 25 MG 24 hr tablet Take 1 tablet (25 mg total) by mouth  daily. 02/14/21   Lelon Perla, MD  pantoprazole (PROTONIX) 40 MG tablet Take 1 tablet (40 mg total) by mouth 3 (three) times a week. 07/24/19 02/14/21  Deberah Pelton, NP  warfarin (COUMADIN) 5 MG tablet TAKE 1 TO 1&1/2 TABLETS BY MOUTH DAILY AS DIRECTED BY THE COUMADIN CLINIC. 03/19/22   Lelon Perla, MD    Family History    Family History  Problem Relation Age of Onset   CVA Father    Heart attack Father    She indicated that her mother is alive. She indicated that her father is alive.  Social History    Social History   Socioeconomic History   Marital status: Married    Spouse name: Not on file   Number of children: 3   Years of education: Not on file   Highest education level: Not on file  Occupational History   Not on file  Tobacco Use   Smoking status: Former    Packs/day: 1.00    Types: Cigarettes    Quit date: 03/30/2017    Years since quitting: 5.0   Smokeless tobacco: Never  Vaping Use   Vaping Use: Never used  Substance and Sexual Activity   Alcohol use: No   Drug use: No  Sexual activity: Not on file  Other Topics Concern   Not on file  Social History Narrative   Not on file   Social Determinants of Health   Financial Resource Strain: Not on file  Food Insecurity: Not on file  Transportation Needs: Not on file  Physical Activity: Not on file  Stress: Not on file  Social Connections: Not on file  Intimate Partner Violence: Not on file     Review of Systems    General:  No chills, fever, night sweats or weight changes.  Cardiovascular:  No chest pain, dyspnea on exertion, edema, orthopnea, palpitations, paroxysmal nocturnal dyspnea. Dermatological: No rash, lesions/masses Respiratory: No cough, dyspnea Urologic: No hematuria, dysuria Abdominal:   No nausea, vomiting, diarrhea, bright red blood per rectum, melena, or hematemesis Neurologic:  No visual changes, wkns, changes in mental status. All other systems reviewed and are  otherwise negative except as noted above.  Physical Exam    VS:  BP 132/82   Pulse 79   Ht 5' 6"$  (1.676 m)   Wt 195 lb 3.2 oz (88.5 kg)   SpO2 98%   BMI 31.51 kg/m  , BMI Body mass index is 31.51 kg/m. GEN: Well nourished, well developed, in no acute distress. HEENT: normal. Neck: Supple, no JVD, carotid bruits, or masses. Cardiac: RRR, no murmurs, systolic click heard along right sternal border and at apex.  Rubs, or gallops. No clubbing, cyanosis, edema.  Radials/DP/PT 2+ and equal bilaterally.  Respiratory:  Respirations regular and unlabored, clear to auscultation bilaterally. GI: Soft, nontender, nondistended, BS + x 4. MS: no deformity or atrophy. Skin: warm and dry, no rash. Neuro:  Strength and sensation are intact. Psych: Normal affect.  Accessory Clinical Findings    Recent Labs: No results found for requested labs within last 365 days.   Recent Lipid Panel No results found for: "CHOL", "TRIG", "HDL", "CHOLHDL", "VLDL", "LDLCALC", "LDLDIRECT"       ECG personally reviewed by me today-normal sinus rhythm no ectopy 75 bpm- No acute changes  Echocardiogram 03/07/2021  IMPRESSIONS     1. Left ventricular ejection fraction, by estimation, is 40 to 45%. Left  ventricular ejection fraction by PLAX is 43 %. The left ventricle has  mildly decreased function. The left ventricle demonstrates global  hypokinesis. Left ventricular diastolic  parameters are indeterminate. Elevated left atrial pressure.   2. Right ventricular systolic function is normal. The right ventricular  size is normal. There is mildly elevated pulmonary artery systolic  pressure.   3. Left atrial size was severely dilated.   4. Mechanical mitral valve mean gradient 6 mmHg compared with 7 mmHg  11/2018. The mitral valve has been repaired/replaced. No evidence of mitral  valve regurgitation. Mild mitral stenosis.   5. The aortic valve has been repaired/replaced. Aortic valve  regurgitation is mild.  Mild aortic valve stenosis. Echo findings are  consistent with normal structure and function of the aortic valve  prosthesis. Aortic regurgitation PHT measures 463 msec.  Aortic valve area, by VTI measures 1.37 cm. Aortic valve Vmax measures  2.75 m/s.   6. Aortic dilatation noted. There is mild dilatation of the aortic root,  measuring 41 mm.   7. The inferior vena cava is normal in size with greater than 50%  respiratory variability, suggesting right atrial pressure of 3 mmHg.   FINDINGS   Left Ventricle: Left ventricular ejection fraction, by estimation, is 40  to 45%. Left ventricular ejection fraction by PLAX is 43 %. The  left  ventricle has mildly decreased function. The left ventricle demonstrates  global hypokinesis. The left  ventricular internal cavity size was normal in size. There is no left  ventricular hypertrophy. Left ventricular diastolic parameters are  indeterminate. Elevated left atrial pressure.   Right Ventricle: The right ventricular size is normal. No increase in  right ventricular wall thickness. Right ventricular systolic function is  normal. There is mildly elevated pulmonary artery systolic pressure. The  tricuspid regurgitant velocity is 2.99   m/s, and with an assumed right atrial pressure of 3 mmHg, the estimated  right ventricular systolic pressure is [id number] mmHg.   Left Atrium: Left atrial size was severely dilated.   Right Atrium: Right atrial size was normal in size.   Pericardium: Trivial pericardial effusion is present.   Mitral Valve: Mechanical mitral valve mean gradient 6 mmHg compared with 7  mmHg 11/2018. The mitral valve has been repaired/replaced. No evidence of  mitral valve regurgitation. There is a [employer] Optiform mechanical  valve present in the mitral  position. Mild mitral valve stenosis. MV peak gradient, 19.4 mmHg. The  mean mitral valve gradient is 6.0 mmHg.   Tricuspid Valve: The tricuspid valve is normal in structure.  Tricuspid  valve regurgitation is trivial. No evidence of tricuspid stenosis.   Aortic Valve: The aortic valve has been repaired/replaced. Aortic valve  regurgitation is mild. Aortic regurgitation PHT measures 463 msec. Mild  aortic stenosis is present. Aortic valve mean gradient measures 14.5 mmHg.  Aortic valve peak gradient measures   30.2 mmHg. Aortic valve area, by VTI measures 1.37 cm. There is a 23 mm  [employer] Top Hat valve present in the aortic position. Echo findings  are consistent with normal structure and function of the aortic valve  prosthesis.   Pulmonic Valve: The pulmonic valve was normal in structure. Pulmonic valve  regurgitation is not visualized. No evidence of pulmonic stenosis.   Aorta: Aortic dilatation noted. There is mild dilatation of the aortic  root, measuring 41 mm.   Venous: The inferior vena cava is normal in size with greater than 50%  respiratory variability, suggesting right atrial pressure of 3 mmHg.   IAS/Shunts: No atrial level shunt detected by color flow Doppler.    Assessment & Plan   1.  Paroxysmal atrial fibrillation-EKG today shows normal sinus rhythm 75 bpm.  Status post maze procedure Continue warfarin, metoprolol Heart healthy low-sodium diet-salty 6 given Increase physical activity as tolerated Avoid triggers caffeine, chocolate, EtOH, dehydration etc. Order CBC, BMP  Dilated cardiomyopathy-no increased DOE or activity intolerance.  Echocardiogram showed EF of 40-45% and well-functioning mitral and aortic valves.  Status post AVR-previous echocardiogram showed well-functioning aortic valve with only mild aortic valve stenosis. Plan for repeat echocardiogram 1/25 Continue SBE prophylaxis-reviewed  Status post MVR-noted to have mean valve gradient of 6 mmHg compared to mean gradient of 7 mmHg 9/20.  No evidence of regurgitation was noted and only mild mitral stenosis.  Chronic systolic and diastolic CHF-stable.  Remains  fairly physically active.  Euvolemic.  EF noted to be 40-45% via echocardiogram 03/07/2021 Continue metoprolol Heart healthy low-sodium diet-salty 6 given Increase physical activity as tolerated  Disposition: Follow-up with Dr. Stanford Breed in 12 months.   Jossie Ng. Ha Placeres NP-C     04/20/2022, 11:47 AM [place] [address] Office [phone] Fax [phone]    I spent 14 minutes examining this patient, reviewing medications, and using patient centered shared decision making involving her cardiac  care.  Prior to her visit I spent greater than 20 minutes reviewing her past medical history,  medications, and prior cardiac tests.

## 2022-04-20 ENCOUNTER — Ambulatory Visit: Payer: BC Managed Care – PPO | Attending: General Practice | Admitting: General Practice

## 2022-04-20 ENCOUNTER — Encounter: Payer: Self-pay | Admitting: General Practice

## 2022-04-20 VITALS — BP 132/82 | HR 79 | Ht 66.0 in | Wt 195.2 lb

## 2022-04-20 DIAGNOSIS — Z952 Presence of prosthetic heart valve: Secondary | ICD-10-CM

## 2022-04-20 DIAGNOSIS — I480 Paroxysmal atrial fibrillation: Secondary | ICD-10-CM

## 2022-04-20 DIAGNOSIS — I420 Dilated cardiomyopathy: Secondary | ICD-10-CM | POA: Diagnosis not present

## 2022-04-20 DIAGNOSIS — I5043 Acute on chronic combined systolic (congestive) and diastolic (congestive) heart failure: Secondary | ICD-10-CM | POA: Diagnosis not present

## 2022-04-20 NOTE — Patient Instructions (Addendum)
Medication Instructions:  The current medical regimen is effective;  continue present plan and medications as directed. Please refer to the Current Medication list given to you today.  *If you need a refill on your cardiac medications before your next appointment, please call your pharmacy*  Lab Work: [place] If you have labs (blood work) drawn today and your tests are completely normal, you will receive your results only by: [place] (if you have MyChart) OR  A paper copy in the mail If you have any lab test that is abnormal or we need to change your treatment, we will call you to review the results.  Testing/Procedures: NONE   Follow-Up: At [facility], you and your health needs are our priority.  As part of our continuing mission to provide you with exceptional heart care, we have created designated Provider Care Teams.  These Care Teams include your primary Cardiologist (physician) and Advanced Practice Providers (APPs -  Physician Assistants and Nurse Practitioners) who all work together to provide you with the care you need, when you need it.  We recommend signing up for the patient portal called "MyChart".  Sign up information is provided on this After Visit Summary.  MyChart is used to connect with patients for Virtual Visits (Telemedicine).  Patients are able to view lab/test results, encounter notes, upcoming appointments, etc.  Non-urgent messages can be sent to your provider as well.   To learn more about what you can do with MyChart, go to [email or web address].    Your next appointment:   12 month(s)  Provider:   Kirk Ruths, MD  or Coletta Memos, FNP or ANY OTHER APP         Other Instructions INCREASE PHYSICAL ACTIVITY AS TOLERATED  PLEASE READ AND FOLLOW ATTACHED  SALTY 6

## 2022-04-21 LAB — BASIC METABOLIC PANEL
BUN/Creatinine Ratio: 16 (ref 12–28)
BUN: 15 mg/dL (ref 8–27)
CO2: 20 mmol/L (ref 20–29)
Calcium: 9.4 mg/dL (ref 8.7–10.3)
Chloride: 98 mmol/L (ref 96–106)
Creatinine, Ser: 0.92 mg/dL (ref 0.57–1.00)
Glucose: 87 mg/dL (ref 70–99)
Potassium: 4.6 mmol/L (ref 3.5–5.2)
Sodium: 137 mmol/L (ref 134–144)
eGFR: 70 mL/min/{1.73_m2} (ref >59)

## 2022-04-21 LAB — CBC
Hematocrit: 39.5 % (ref 34.0–46.6)
Hemoglobin: 13.3 g/dL (ref 11.1–15.9)
MCH: 29.9 pg (ref 26.6–33.0)
MCHC: 33.7 g/dL (ref 31.5–35.7)
MCV: 89 fL (ref 79–97)
Platelets: 328 10*3/uL (ref 150–450)
RBC: 4.45 x10E6/uL (ref 3.77–5.28)
RDW: 13 % (ref 11.7–15.4)
WBC: 6.6 10*3/uL (ref 3.4–10.8)

## 2022-04-27 ENCOUNTER — Ambulatory Visit: Payer: BC Managed Care – PPO

## 2022-05-04 ENCOUNTER — Ambulatory Visit: Payer: BC Managed Care – PPO | Attending: Cardiology | Admitting: *Deleted

## 2022-05-04 DIAGNOSIS — Z5181 Encounter for therapeutic drug level monitoring: Secondary | ICD-10-CM | POA: Diagnosis not present

## 2022-05-04 DIAGNOSIS — I050 Rheumatic mitral stenosis: Secondary | ICD-10-CM | POA: Diagnosis not present

## 2022-05-04 DIAGNOSIS — Z952 Presence of prosthetic heart valve: Secondary | ICD-10-CM

## 2022-05-04 DIAGNOSIS — I4892 Unspecified atrial flutter: Secondary | ICD-10-CM | POA: Diagnosis not present

## 2022-05-04 DIAGNOSIS — I480 Paroxysmal atrial fibrillation: Secondary | ICD-10-CM

## 2022-05-04 LAB — POCT INR: INR: 3.5 — AB (ref 2.0–3.0)

## 2022-05-04 NOTE — Patient Instructions (Addendum)
Description   DO NOT PRINT. Continue taking warfarin 1 tablet daily except 1.5 tablets on Monday, Wednesday and Friday. Recheck in 5 weeks. Anticoagulation Clinic [phone]

## 2022-05-28 ENCOUNTER — Telehealth: Payer: Self-pay | Admitting: Cardiology

## 2022-05-28 NOTE — Telephone Encounter (Signed)
Returned pt's call and made her aware Zyrtec-D will not interact with her Warfarin. Pt verbalized understanding and appreciated the call back.

## 2022-05-28 NOTE — Telephone Encounter (Signed)
Pt c/o medication issue:  1. Name of Medication: Zyrtec-D  2. How are you currently taking this medication (dosage and times per day)? Not currently taking  3. Are you having a reaction (difficulty breathing--STAT)? No   4. What is your medication issue? Wanting to know if it's okay for her to take the pharmacy prescribed Zyrtec-D while on coumadin. Please advise.

## 2022-05-31 ENCOUNTER — Ambulatory Visit: Payer: BC Managed Care – PPO

## 2022-06-03 DIAGNOSIS — J069 Acute upper respiratory infection, unspecified: Secondary | ICD-10-CM | POA: Diagnosis not present

## 2022-06-03 DIAGNOSIS — R058 Other specified cough: Secondary | ICD-10-CM | POA: Diagnosis not present

## 2022-06-27 ENCOUNTER — Ambulatory Visit: Payer: BC Managed Care – PPO | Attending: Cardiology | Admitting: Pharmacist

## 2022-06-27 DIAGNOSIS — Z952 Presence of prosthetic heart valve: Secondary | ICD-10-CM | POA: Diagnosis not present

## 2022-06-27 DIAGNOSIS — I4892 Unspecified atrial flutter: Secondary | ICD-10-CM

## 2022-06-27 DIAGNOSIS — Z5181 Encounter for therapeutic drug level monitoring: Secondary | ICD-10-CM

## 2022-06-27 DIAGNOSIS — I050 Rheumatic mitral stenosis: Secondary | ICD-10-CM | POA: Diagnosis not present

## 2022-06-27 DIAGNOSIS — I480 Paroxysmal atrial fibrillation: Secondary | ICD-10-CM

## 2022-06-27 LAB — POCT INR: INR: 2.6 (ref 2.0–3.0)

## 2022-06-27 NOTE — Patient Instructions (Signed)
Description   DO NOT PRINT. Take 2 tablets tonight and 1 and 1/2 tablets tomorrow and then continue warfarin 1 tablet daily except 1.5 tablets on Monday, Wednesday and Friday. Recheck in  weeks. Anticoagulation Clinic [phone]

## 2022-07-20 ENCOUNTER — Ambulatory Visit: Payer: BC Managed Care – PPO | Attending: Cardiology

## 2022-07-20 DIAGNOSIS — I480 Paroxysmal atrial fibrillation: Secondary | ICD-10-CM

## 2022-07-20 DIAGNOSIS — I050 Rheumatic mitral stenosis: Secondary | ICD-10-CM | POA: Diagnosis not present

## 2022-07-20 DIAGNOSIS — Z5181 Encounter for therapeutic drug level monitoring: Secondary | ICD-10-CM

## 2022-07-20 DIAGNOSIS — I4892 Unspecified atrial flutter: Secondary | ICD-10-CM | POA: Diagnosis not present

## 2022-07-20 DIAGNOSIS — Z952 Presence of prosthetic heart valve: Secondary | ICD-10-CM

## 2022-07-20 LAB — POCT INR: INR: 3.1 — AB (ref 2.0–3.0)

## 2022-07-20 NOTE — Patient Instructions (Signed)
DO NOT PRINT.  continue warfarin 1 tablet daily except 1.5 tablets on Monday, Wednesday and Friday. Recheck in 6 weeks. Anticoagulation Clinic [phone]

## 2022-07-27 ENCOUNTER — Other Ambulatory Visit: Payer: Self-pay | Admitting: Cardiology

## 2022-07-27 DIAGNOSIS — I480 Paroxysmal atrial fibrillation: Secondary | ICD-10-CM

## 2022-08-31 ENCOUNTER — Ambulatory Visit: Payer: BC Managed Care – PPO | Attending: Cardiovascular Disease

## 2022-08-31 DIAGNOSIS — I4892 Unspecified atrial flutter: Secondary | ICD-10-CM | POA: Diagnosis not present

## 2022-08-31 DIAGNOSIS — I050 Rheumatic mitral stenosis: Secondary | ICD-10-CM

## 2022-08-31 DIAGNOSIS — Z952 Presence of prosthetic heart valve: Secondary | ICD-10-CM | POA: Diagnosis not present

## 2022-08-31 DIAGNOSIS — Z5181 Encounter for therapeutic drug level monitoring: Secondary | ICD-10-CM

## 2022-08-31 DIAGNOSIS — I480 Paroxysmal atrial fibrillation: Secondary | ICD-10-CM

## 2022-08-31 LAB — POCT INR: INR: 3.7 — AB (ref 2.0–3.0)

## 2022-08-31 NOTE — Patient Instructions (Signed)
DO NOT PRINT.  continue warfarin 1 tablet daily except 1.5 tablets on Monday, Wednesday and Friday. Recheck in 6 weeks. Anticoagulation Clinic [phone]  

## 2022-09-15 ENCOUNTER — Other Ambulatory Visit: Payer: Self-pay | Admitting: Cardiology

## 2022-10-12 ENCOUNTER — Ambulatory Visit: Payer: BC Managed Care – PPO

## 2022-10-18 ENCOUNTER — Ambulatory Visit: Payer: BC Managed Care – PPO | Attending: Cardiology | Admitting: *Deleted

## 2022-10-18 DIAGNOSIS — Z952 Presence of prosthetic heart valve: Secondary | ICD-10-CM

## 2022-10-18 DIAGNOSIS — I050 Rheumatic mitral stenosis: Secondary | ICD-10-CM

## 2022-10-18 DIAGNOSIS — I480 Paroxysmal atrial fibrillation: Secondary | ICD-10-CM | POA: Diagnosis not present

## 2022-10-18 DIAGNOSIS — I4892 Unspecified atrial flutter: Secondary | ICD-10-CM | POA: Diagnosis not present

## 2022-10-18 DIAGNOSIS — Z5181 Encounter for therapeutic drug level monitoring: Secondary | ICD-10-CM

## 2022-10-18 LAB — POCT INR: INR: 4.2 — AB (ref 2.0–3.0)

## 2022-10-18 NOTE — Patient Instructions (Signed)
Description   DO NOT PRINT. Do not take any warfarin today then continue warfarin 1 tablet daily except 1.5 tablets on Monday, Wednesday and Friday. Recheck in 4 weeks. Anticoagulation Clinic [phone]

## 2022-11-15 ENCOUNTER — Ambulatory Visit: Payer: BC Managed Care – PPO | Attending: Internal Medicine

## 2022-11-15 DIAGNOSIS — Z5181 Encounter for therapeutic drug level monitoring: Secondary | ICD-10-CM | POA: Diagnosis not present

## 2022-11-15 DIAGNOSIS — I4892 Unspecified atrial flutter: Secondary | ICD-10-CM

## 2022-11-15 DIAGNOSIS — Z952 Presence of prosthetic heart valve: Secondary | ICD-10-CM | POA: Diagnosis not present

## 2022-11-15 DIAGNOSIS — I480 Paroxysmal atrial fibrillation: Secondary | ICD-10-CM

## 2022-11-15 LAB — POCT INR: INR: 3.7 — AB (ref 2.0–3.0)

## 2022-11-15 NOTE — Patient Instructions (Addendum)
Description   DO NOT PRINT. Eat a serving of greens today and START taking warfarin 1 tablet daily except 1.5 tablets on Monday and Friday.  Stay consistent with greens each week  Recheck in 3 weeks.  Anticoagulation Clinic [phone]

## 2022-12-06 ENCOUNTER — Ambulatory Visit: Payer: BC Managed Care – PPO | Attending: Internal Medicine

## 2022-12-06 DIAGNOSIS — Z5181 Encounter for therapeutic drug level monitoring: Secondary | ICD-10-CM

## 2022-12-06 DIAGNOSIS — Z952 Presence of prosthetic heart valve: Secondary | ICD-10-CM | POA: Diagnosis not present

## 2022-12-06 DIAGNOSIS — I4892 Unspecified atrial flutter: Secondary | ICD-10-CM

## 2022-12-06 DIAGNOSIS — I480 Paroxysmal atrial fibrillation: Secondary | ICD-10-CM | POA: Diagnosis not present

## 2022-12-06 LAB — POCT INR: INR: 2.6 (ref 2.0–3.0)

## 2022-12-06 NOTE — Patient Instructions (Signed)
Description   DO NOT PRINT. Take 2 tablets today and then continue taking warfarin 1 tablet daily except 1.5 tablets on Monday and Friday.  Stay consistent with greens each week  Recheck in 4 weeks.  Anticoagulation Clinic [phone]

## 2023-01-07 ENCOUNTER — Ambulatory Visit: Payer: BC Managed Care – PPO | Attending: Cardiology

## 2023-01-07 DIAGNOSIS — I050 Rheumatic mitral stenosis: Secondary | ICD-10-CM | POA: Diagnosis not present

## 2023-01-07 DIAGNOSIS — Z952 Presence of prosthetic heart valve: Secondary | ICD-10-CM

## 2023-01-07 DIAGNOSIS — Z5181 Encounter for therapeutic drug level monitoring: Secondary | ICD-10-CM | POA: Diagnosis not present

## 2023-01-07 DIAGNOSIS — I480 Paroxysmal atrial fibrillation: Secondary | ICD-10-CM

## 2023-01-07 DIAGNOSIS — I4892 Unspecified atrial flutter: Secondary | ICD-10-CM

## 2023-01-07 LAB — POCT INR: INR: 3.4 — AB (ref 2.0–3.0)

## 2023-01-07 NOTE — Patient Instructions (Signed)
DO NOT PRINT.  continue taking warfarin 1 tablet daily except 1.5 tablets on Monday and Friday.  Stay consistent with greens each week  Recheck in 6 weeks  Anticoagulation Clinic [phone]

## 2023-02-18 ENCOUNTER — Ambulatory Visit: Payer: BC Managed Care – PPO | Attending: Cardiology

## 2023-02-18 DIAGNOSIS — Z5181 Encounter for therapeutic drug level monitoring: Secondary | ICD-10-CM

## 2023-02-18 DIAGNOSIS — I4892 Unspecified atrial flutter: Secondary | ICD-10-CM | POA: Diagnosis not present

## 2023-02-18 DIAGNOSIS — I480 Paroxysmal atrial fibrillation: Secondary | ICD-10-CM | POA: Diagnosis not present

## 2023-02-18 DIAGNOSIS — Z952 Presence of prosthetic heart valve: Secondary | ICD-10-CM

## 2023-02-18 LAB — POCT INR: INR: 3.7 — AB (ref 2.0–3.0)

## 2023-02-18 NOTE — Patient Instructions (Signed)
Description   DO NOT PRINT. Only take 1 tablet today and then continue taking warfarin 1 tablet daily except 1.5 tablets on Monday and Friday.  Stay consistent with greens each week  Recheck in 4 weeks  Anticoagulation Clinic [phone]

## 2023-03-15 ENCOUNTER — Ambulatory Visit: Payer: BC Managed Care – PPO | Attending: Cardiology

## 2023-03-15 DIAGNOSIS — Z952 Presence of prosthetic heart valve: Secondary | ICD-10-CM

## 2023-03-15 DIAGNOSIS — I480 Paroxysmal atrial fibrillation: Secondary | ICD-10-CM | POA: Diagnosis not present

## 2023-03-15 LAB — POCT INR: INR: 2.8 (ref 2.0–3.0)

## 2023-03-15 NOTE — Patient Instructions (Signed)
Description   DO NOT PRINT. Take 2 tablets today and then continue taking warfarin 1 tablet daily except 1.5 tablets on Monday and Friday.  Stay consistent with greens each week  Recheck in 4 weeks.  Anticoagulation Clinic [phone]

## 2023-03-19 ENCOUNTER — Other Ambulatory Visit: Payer: Self-pay | Admitting: Cardiology

## 2023-04-12 ENCOUNTER — Ambulatory Visit: Payer: BC Managed Care – PPO | Attending: Cardiology

## 2023-04-12 DIAGNOSIS — I480 Paroxysmal atrial fibrillation: Secondary | ICD-10-CM | POA: Diagnosis not present

## 2023-04-12 DIAGNOSIS — I050 Rheumatic mitral stenosis: Secondary | ICD-10-CM

## 2023-04-12 DIAGNOSIS — I4892 Unspecified atrial flutter: Secondary | ICD-10-CM

## 2023-04-12 DIAGNOSIS — Z952 Presence of prosthetic heart valve: Secondary | ICD-10-CM

## 2023-04-12 DIAGNOSIS — Z5181 Encounter for therapeutic drug level monitoring: Secondary | ICD-10-CM

## 2023-04-12 LAB — POCT INR: INR: 2.6 (ref 2.0–3.0)

## 2023-04-12 NOTE — Patient Instructions (Signed)
 DO NOT PRINT. Take 2 tablets today and then continue taking warfarin 1 tablet daily except 1.5 tablets on Monday and Friday. Hold off on broccoli next Tuesday. Stay consistent with greens each week  Recheck in 3 weeks  Anticoagulation Clinic [phone]

## 2023-04-23 ENCOUNTER — Ambulatory Visit: Payer: BC Managed Care – PPO | Admitting: Family Medicine

## 2023-04-23 ENCOUNTER — Encounter: Payer: Self-pay | Admitting: Family Medicine

## 2023-04-23 VITALS — BP 140/90 | HR 67 | Temp 97.7°F | Resp 18 | Ht 66.0 in | Wt 185.4 lb

## 2023-04-23 DIAGNOSIS — E2839 Other primary ovarian failure: Secondary | ICD-10-CM

## 2023-04-23 DIAGNOSIS — I4891 Unspecified atrial fibrillation: Secondary | ICD-10-CM | POA: Diagnosis not present

## 2023-04-23 DIAGNOSIS — Z01419 Encounter for gynecological examination (general) (routine) without abnormal findings: Secondary | ICD-10-CM

## 2023-04-23 DIAGNOSIS — Z0000 Encounter for general adult medical examination without abnormal findings: Secondary | ICD-10-CM

## 2023-04-23 DIAGNOSIS — Z8249 Family history of ischemic heart disease and other diseases of the circulatory system: Secondary | ICD-10-CM | POA: Diagnosis not present

## 2023-04-23 DIAGNOSIS — Z1159 Encounter for screening for other viral diseases: Secondary | ICD-10-CM

## 2023-04-23 DIAGNOSIS — I10 Essential (primary) hypertension: Secondary | ICD-10-CM | POA: Diagnosis not present

## 2023-04-23 DIAGNOSIS — Z23 Encounter for immunization: Secondary | ICD-10-CM | POA: Diagnosis not present

## 2023-04-23 LAB — COMPREHENSIVE METABOLIC PANEL
ALT: 13 U/L (ref 0–35)
AST: 18 U/L (ref 0–37)
Albumin: 4.6 g/dL (ref 3.5–5.2)
Alkaline Phosphatase: 98 U/L (ref 39–117)
BUN: 17 mg/dL (ref 6–23)
CO2: 30 meq/L (ref 19–32)
Calcium: 9.3 mg/dL (ref 8.4–10.5)
Chloride: 101 meq/L (ref 96–112)
Creatinine, Ser: 0.79 mg/dL (ref 0.40–1.20)
GFR: 78.71 mL/min (ref >60.00)
Glucose, Bld: 100 mg/dL — ABNORMAL HIGH (ref 70–99)
Potassium: 4.8 meq/L (ref 3.5–5.1)
Sodium: 139 meq/L (ref 135–145)
Total Bilirubin: 0.7 mg/dL (ref 0.2–1.2)
Total Protein: 7.4 g/dL (ref 6.0–8.3)

## 2023-04-23 LAB — CBC WITH DIFFERENTIAL/PLATELET
Basophils Absolute: 0 10*3/uL (ref 0.0–0.1)
Basophils Relative: 0.8 % (ref 0.0–3.0)
Eosinophils Absolute: 0 10*3/uL (ref 0.0–0.7)
Eosinophils Relative: 0.8 % (ref 0.0–5.0)
HCT: 38.5 % (ref 36.0–46.0)
Hemoglobin: 13 g/dL (ref 12.0–15.0)
Lymphocytes Relative: 23.2 % (ref 12.0–46.0)
Lymphs Abs: 1.3 10*3/uL (ref 0.7–4.0)
MCHC: 33.7 g/dL (ref 30.0–36.0)
MCV: 91.4 fL (ref 78.0–100.0)
Monocytes Absolute: 0.4 10*3/uL (ref 0.1–1.0)
Monocytes Relative: 6.6 % (ref 3.0–12.0)
Neutro Abs: 3.9 10*3/uL (ref 1.4–7.7)
Neutrophils Relative %: 68.6 % (ref 43.0–77.0)
Platelets: 299 10*3/uL (ref 150.0–400.0)
RBC: 4.22 Mil/uL (ref 3.87–5.11)
RDW: 13.5 % (ref 11.5–15.5)
WBC: 5.6 10*3/uL (ref 4.0–10.5)

## 2023-04-23 LAB — LIPID PANEL
Cholesterol: 269 mg/dL — ABNORMAL HIGH (ref 0–200)
HDL: 56.1 mg/dL (ref >39.00)
LDL Cholesterol: 187 mg/dL — ABNORMAL HIGH (ref 0–99)
NonHDL: 212.63
Total CHOL/HDL Ratio: 5
Triglycerides: 130 mg/dL (ref 0.0–149.0)
VLDL: 26 mg/dL (ref 0.0–40.0)

## 2023-04-23 LAB — TSH: TSH: 2.01 u[IU]/mL (ref 0.35–5.50)

## 2023-04-23 NOTE — Patient Instructions (Signed)
 Preventive Care 16-65 Years Old, Female  Preventive care refers to lifestyle choices and visits with your health care provider that can promote health and wellness. Preventive care visits are also called wellness exams.  What can I expect for my preventive care visit?  Counseling  Your health care provider may ask you questions about your:  Medical history, including:  Past medical problems.  Family medical history.  Pregnancy history.  Current health, including:  Menstrual cycle.  Method of birth control.  Emotional well-being.  Home life and relationship well-being.  Sexual activity and sexual health.  Lifestyle, including:  Alcohol, nicotine or tobacco, and drug use.  Access to firearms.  Diet, exercise, and sleep habits.  Work and work Astronomer.  Sunscreen use.  Safety issues such as seatbelt and bike helmet use.  Physical exam  Your health care provider will check your:  Height and weight. These may be used to calculate your BMI (body mass index). BMI is a measurement that tells if you are at a healthy weight.  Waist circumference. This measures the distance around your waistline. This measurement also tells if you are at a healthy weight and may help predict your risk of certain diseases, such as type 2 diabetes and high blood pressure.  Heart rate and blood pressure.  Body temperature.  Skin for abnormal spots.  What immunizations do I need?    Vaccines are usually given at various ages, according to a schedule. Your health care provider will recommend vaccines for you based on your age, medical history, and lifestyle or other factors, such as travel or where you work.  What tests do I need?  Screening  Your health care provider may recommend screening tests for certain conditions. This may include:  Lipid and cholesterol levels.  Diabetes screening. This is done by checking your blood sugar (glucose) after you have not eaten for a while (fasting).  Pelvic exam and Pap test.  Hepatitis B test.  Hepatitis C  test.  HIV (human immunodeficiency virus) test.  STI (sexually transmitted infection) testing, if you are at risk.  Lung cancer screening.  Colorectal cancer screening.  Mammogram. Talk with your health care provider about when you should start having regular mammograms. This may depend on whether you have a family history of breast cancer.  BRCA-related cancer screening. This may be done if you have a family history of breast, ovarian, tubal, or peritoneal cancers.  Bone density scan. This is done to screen for osteoporosis.  Talk with your health care provider about your test results, treatment options, and if necessary, the need for more tests.  Follow these instructions at home:  Eating and drinking    Eat a diet that includes fresh fruits and vegetables, whole grains, lean protein, and low-fat dairy products.  Take vitamin and mineral supplements as recommended by your health care provider.  Do not drink alcohol if:  Your health care provider tells you not to drink.  You are pregnant, may be pregnant, or are planning to become pregnant.  If you drink alcohol:  Limit how much you have to 0-1 drink a day.  Know how much alcohol is in your drink. In the [place] one drink equals one 12 oz bottle of beer (355 mL), one 5 oz glass of wine (148 mL), or one 1 oz glass of hard liquor (44 mL).  Lifestyle  Brush your teeth every morning and night with fluoride toothpaste. Floss one time each day.  Exercise for at least  30 minutes 5 or more days each week.  Do not use any products that contain nicotine or tobacco. These products include cigarettes, chewing tobacco, and vaping devices, such as e-cigarettes. If you need help quitting, ask your health care provider.  Do not use drugs.  If you are sexually active, practice safe sex. Use a condom or other form of protection to prevent STIs.  If you do not wish to become pregnant, use a form of birth control. If you plan to become pregnant, see your health care provider for a  prepregnancy visit.  Take aspirin only as told by your health care provider. Make sure that you understand how much to take and what form to take. Work with your health care provider to find out whether it is safe and beneficial for you to take aspirin daily.  Find healthy ways to manage stress, such as:  Meditation, yoga, or listening to music.  Journaling.  Talking to a trusted person.  Spending time with friends and family.  Minimize exposure to UV radiation to reduce your risk of skin cancer.  Safety  Always wear your seat belt while driving or riding in a vehicle.  Do not drive:  If you have been drinking alcohol. Do not ride with someone who has been drinking.  When you are tired or distracted.  While texting.  If you have been using any mind-altering substances or drugs.  Wear a helmet and other protective equipment during sports activities.  If you have firearms in your house, make sure you follow all gun safety procedures.  Seek help if you have been physically or sexually abused.  What's next?  Visit your health care provider once a year for an annual wellness visit.  Ask your health care provider how often you should have your eyes and teeth checked.  Stay up to date on all vaccines.  This information is not intended to replace advice given to you by your health care provider. Make sure you discuss any questions you have with your health care provider.  Document Revised: 08/17/2020 Document Reviewed: 08/17/2020  Elsevier Patient Education  2024 [employer].

## 2023-04-23 NOTE — Progress Notes (Signed)
Established Patient Office Visit  Subjective   Patient ID: Sharon Figueroa, female    DOB: Nov 21, 1960  Age: 63 y.o. MRN: [id number]  Chief Complaint  Patient presents with   New Patient (Initial Visit)    HPI Discussed the use of AI scribe software for clinical note transcription with the patient, who gave verbal consent to proceed.  History of Present Illness   Sharon Figueroa is a 63 year old female with valve replacement and atrial fibrillation who presents to establish care and management of her chronic conditions.  She has a history of valve replacement in 2019 involving the aortic and mitral valves due to rheumatic heart disease. She is on warfarin with a target INR range of 3 to 3.5, which she finds difficult to maintain due to dietary influences such as consuming broccoli. She monitors her INR every four weeks. She also takes metoprolol once daily for atrial fibrillation but did not take it on the morning of the visit due to uncertainty about blood work.  In 2020, she experienced a bleeding ulcer, which was managed by Dr. Marca Ancona at [facility]. She is on Protonix for this condition.  Her family history includes her father, who had a stroke and a heart attack in his 72s, and her mother, who has COPD. She has five siblings, with one sister having melanoma due to tanning bed use.  She is currently working in IT at [employer] and has been living in the area since 2006. She is divorced and has two children, one of whom lives nearby. She engages in daily exercise using a walking pad and monitors her steps with her cell phone.      Patient Active Problem List   Diagnosis Date Noted   GI bleed 11/18/2018   Hemorrhagic shock (HCC)    Elevated INR    Symptomatic anemia    Encounter for therapeutic drug monitoring 04/12/2017   Atrial flutter with rapid ventricular response (HCC)    S/P MVR (mitral valve replacement) 04/04/2017   S/P AVR 04/04/2017   Rheumatic heart disease    Severe mitral  valve stenosis    Atrial fibrillation (HCC) 03/30/2017   Past Medical History:  Diagnosis Date   Mitral valve disorder    "leaky" valve diagnosed 30+ yr ago   Past Surgical History:  Procedure Laterality Date   AORTIC VALVE REPLACEMENT N/A 04/04/2017   Procedure: AORTIC VALVE REPLACEMENT (AVR);  Surgeon: Alleen Borne, MD;  Location: [facility] OR;  Service: Open Heart Surgery;  Laterality: N/A;   BIOPSY  11/21/2018   Procedure: BIOPSY;  Surgeon: Kerin Salen, MD;  Location: [facility] ENDOSCOPY;  Service: Gastroenterology;;   CARDIAC SURGERY     COLONOSCOPY WITH PROPOFOL N/A 11/21/2018   Procedure: COLONOSCOPY WITH PROPOFOL;  Surgeon: Kerin Salen, MD;  Location: [facility] ENDOSCOPY;  Service: Gastroenterology;  Laterality: N/A;   ESOPHAGOGASTRODUODENOSCOPY N/A 11/20/2018   Procedure: ESOPHAGOGASTRODUODENOSCOPY (EGD);  Surgeon: Kerin Salen, MD;  Location: [facility] ENDOSCOPY;  Service: Gastroenterology;  Laterality: N/A;   HOT HEMOSTASIS N/A 11/20/2018   Procedure: HOT HEMOSTASIS (ARGON PLASMA COAGULATION/BICAP);  Surgeon: Kerin Salen, MD;  Location: [facility] ENDOSCOPY;  Service: Gastroenterology;  Laterality: N/A;   MAZE N/A 04/04/2017   Procedure: MAZE;  Surgeon: Alleen Borne, MD;  Location: [facility] OR;  Service: Open Heart Surgery;  Laterality: N/A;   MITRAL VALVE REPLACEMENT N/A 04/04/2017   Procedure: MITRAL VALVE (MV) REPLACEMENT;  Surgeon: Alleen Borne, MD;  Location: [facility] OR;  Service: Open Heart Surgery;  Laterality:  N/A;   No prior surgery     POLYPECTOMY  11/21/2018   Procedure: POLYPECTOMY;  Surgeon: Kerin Salen, MD;  Location: [facility] ENDOSCOPY;  Service: Gastroenterology;;   RIGHT/LEFT HEART CATH AND CORONARY ANGIOGRAPHY N/A 04/01/2017   Procedure: RIGHT/LEFT HEART CATH AND CORONARY ANGIOGRAPHY;  Surgeon: Runell Gess, MD;  Location: [facility] INVASIVE CV LAB;  Service: Cardiovascular;  Laterality: N/A;   TEE WITHOUT CARDIOVERSION N/A 04/04/2017   Procedure: TRANSESOPHAGEAL ECHOCARDIOGRAM (TEE);  Surgeon: Alleen Borne,  MD;  Location: [facility] OR;  Service: Open Heart Surgery;  Laterality: N/A;   Social History   Tobacco Use   Smoking status: Former    Current packs/day: 0.00    Types: Cigarettes    Quit date: 03/30/2017    Years since quitting: 6.0   Smokeless tobacco: Never  Vaping Use   Vaping status: Never Used  Substance Use Topics   Alcohol use: No   Drug use: No   Social History   Socioeconomic History   Marital status: Married    Spouse name: Not on file   Number of children: 3   Years of education: Not on file   Highest education level: Not on file  Occupational History   Not on file  Tobacco Use   Smoking status: Former    Current packs/day: 0.00    Types: Cigarettes    Quit date: 03/30/2017    Years since quitting: 6.0   Smokeless tobacco: Never  Vaping Use   Vaping status: Never Used  Substance and Sexual Activity   Alcohol use: No   Drug use: No   Sexual activity: Yes    Partners: Male  Other Topics Concern   Not on file  Social History Narrative   Exercise----- walking pad , daily    Social Drivers of Health   Financial Resource Strain: Not on file  Food Insecurity: Not on file  Transportation Needs: Not on file  Physical Activity: Not on file  Stress: Not on file  Social Connections: Not on file  Intimate Partner Violence: Not on file   Family Status  Relation Name Status   Mother  Alive   Father  Alive   Sister  Alive   Sister  Alive   Sister  Alive   Brother  Alive   Brother  Alive  No partnership data on file      Review of Systems  Constitutional:  Negative for fever and malaise/fatigue.  HENT:  Negative for congestion.   Eyes:  Negative for blurred vision.  Respiratory:  Negative for cough and shortness of breath.   Cardiovascular:  Negative for chest pain, palpitations and leg swelling.  Gastrointestinal:  Negative for abdominal pain, blood in stool, nausea and vomiting.  Genitourinary:  Negative for dysuria and frequency.  Musculoskeletal:   Negative for back pain and falls.  Skin:  Negative for rash.  Neurological:  Negative for dizziness, loss of consciousness and headaches.  Endo/Heme/Allergies:  Negative for environmental allergies.  Psychiatric/Behavioral:  Negative for depression. The patient is not nervous/anxious.       Objective:     BP (!) 140/90 (BP Location: Left Arm, Patient Position: Sitting, Cuff Size: Large)   Pulse 67   Temp 97.7 F (36.5 C) (Oral)   Resp 18   Ht 5\' 6"  (1.676 m)   Wt 185 lb 6.4 oz (84.1 kg)   SpO2 97%   BMI 29.92 kg/m  BP Readings from Last 3 Encounters:  04/23/23 (!) 140/90  04/20/22 132/82  02/14/21 122/74   Wt Readings from Last 3 Encounters:  04/23/23 185 lb 6.4 oz (84.1 kg)  04/20/22 195 lb 3.2 oz (88.5 kg)  02/14/21 191 lb (86.6 kg)   SpO2 Readings from Last 3 Encounters:  04/23/23 97%  04/20/22 98%  02/14/21 93%      Physical Exam Vitals and nursing note reviewed.  Constitutional:      General: She is not in acute distress.    Appearance: Normal appearance. She is well-developed.  HENT:     Head: Normocephalic and atraumatic.  Eyes:     General: No scleral icterus.       Right eye: No discharge.        Left eye: No discharge.  Cardiovascular:     Rate and Rhythm: Normal rate and regular rhythm.     Heart sounds: No murmur heard. Pulmonary:     Effort: Pulmonary effort is normal. No respiratory distress.     Breath sounds: Normal breath sounds.  Musculoskeletal:        General: Normal range of motion.     Cervical back: Normal range of motion and neck supple.     Right lower leg: No edema.     Left lower leg: No edema.  Skin:    General: Skin is warm and dry.  Neurological:     Mental Status: She is alert and oriented to person, place, and time.  Psychiatric:        Mood and Affect: Mood normal.        Behavior: Behavior normal.        Thought Content: Thought content normal.        Judgment: Judgment normal.      Results for orders placed or  performed in visit on 04/23/23  CBC with Differential/Platelet  Result Value Ref Range   WBC 5.6 4.0 - 10.5 K/uL   RBC 4.22 3.87 - 5.11 Mil/uL   Hemoglobin 13.0 12.0 - 15.0 g/dL   HCT 30.8 65.7 - 84.6 %   MCV 91.4 78.0 - 100.0 fl   MCHC 33.7 30.0 - 36.0 g/dL   RDW 96.2 95.2 - 84.1 %   Platelets 299.0 150.0 - 400.0 K/uL   Neutrophils Relative % 68.6 43.0 - 77.0 %   Lymphocytes Relative 23.2 12.0 - 46.0 %   Monocytes Relative 6.6 3.0 - 12.0 %   Eosinophils Relative 0.8 0.0 - 5.0 %   Basophils Relative 0.8 0.0 - 3.0 %   Neutro Abs 3.9 1.4 - 7.7 K/uL   Lymphs Abs 1.3 0.7 - 4.0 K/uL   Monocytes Absolute 0.4 0.1 - 1.0 K/uL   Eosinophils Absolute 0.0 0.0 - 0.7 K/uL   Basophils Absolute 0.0 0.0 - 0.1 K/uL  Comprehensive metabolic panel  Result Value Ref Range   Sodium 139 135 - 145 mEq/L   Potassium 4.8 3.5 - 5.1 mEq/L   Chloride 101 96 - 112 mEq/L   CO2 30 19 - 32 mEq/L   Glucose, Bld 100 (H) 70 - 99 mg/dL   BUN 17 6 - 23 mg/dL   Creatinine, Ser 3.24 0.40 - 1.20 mg/dL   Total Bilirubin 0.7 0.2 - 1.2 mg/dL   Alkaline Phosphatase 98 39 - 117 U/L   AST 18 0 - 37 U/L   ALT 13 0 - 35 U/L   Total Protein 7.4 6.0 - 8.3 g/dL   Albumin 4.6 3.5 - 5.2 g/dL   GFR 40.10 >27.25 mL/min  Calcium 9.3 8.4 - 10.5 mg/dL  Lipid panel  Result Value Ref Range   Cholesterol 269 (H) 0 - 200 mg/dL   Triglycerides 161.0 0.0 - 149.0 mg/dL   HDL 96.04 >54.09 mg/dL   VLDL 81.1 0.0 - 91.4 mg/dL   LDL Cholesterol 782 (H) 0 - 99 mg/dL   Total CHOL/HDL Ratio 5    NonHDL 212.63   TSH  Result Value Ref Range   TSH 2.01 0.35 - 5.50 uIU/mL    Last CBC Lab Results  Component Value Date   WBC 5.6 04/23/2023   HGB 13.0 04/23/2023   HCT 38.5 04/23/2023   MCV 91.4 04/23/2023   MCH 29.9 04/20/2022   RDW 13.5 04/23/2023   PLT 299.0 04/23/2023   Last metabolic panel Lab Results  Component Value Date   GLUCOSE 100 (H) 04/23/2023   NA 139 04/23/2023   K 4.8 04/23/2023   CL 101 04/23/2023   CO2 30  04/23/2023   BUN 17 04/23/2023   CREATININE 0.79 04/23/2023   GFR 78.71 04/23/2023   CALCIUM 9.3 04/23/2023   PHOS 4.1 11/22/2018   PROT 7.4 04/23/2023   ALBUMIN 4.6 04/23/2023   BILITOT 0.7 04/23/2023   ALKPHOS 98 04/23/2023   AST 18 04/23/2023   ALT 13 04/23/2023   ANIONGAP 7 11/22/2018   Last lipids Lab Results  Component Value Date   CHOL 269 (H) 04/23/2023   HDL 56.10 04/23/2023   LDLCALC 187 (H) 04/23/2023   TRIG 130.0 04/23/2023   CHOLHDL 5 04/23/2023   Last hemoglobin A1c Lab Results  Component Value Date   HGBA1C 5.3 03/30/2017   Last thyroid functions Lab Results  Component Value Date   TSH 2.01 04/23/2023   Last vitamin D No results found for: "25OHVITD2", "25OHVITD3", "VD25OH"    The 10-year ASCVD risk score (Arnett DK, et al., 2019) is: 7.6%    Assessment & Plan:   Problem List Items Addressed This Visit   None Visit Diagnoses       Preventative health care    -  Primary   Relevant Orders   CBC with Differential/Platelet (Completed)   Comprehensive metabolic panel (Completed)   Lipid panel (Completed)   TSH (Completed)     Estrogen deficiency       Relevant Orders   MM 3D SCREENING MAMMOGRAM BILATERAL BREAST   DG Bone Density     Need for vaccination       Relevant Orders   Pneumococcal conjugate vaccine 20-valent (Prevnar 20) (Completed)   Tdap vaccine greater than or equal to 7yo IM (Completed)     Need for hepatitis C screening test       Relevant Orders   Hepatitis C antibody     Encounter for gynecological examination without abnormal finding       Relevant Orders   Ambulatory referral to Obstetrics / Gynecology     Assessment and Plan    Atrial Fibrillation   Currently on Coumadin and metoprolol, there is difficulty maintaining INR within the therapeutic range of 3 to 3.5 due to dietary variations. The risks of bleeding with higher INR ranges were discussed. She prefers to discuss potential adjustment of the INR range with her  cardiologist in March. Continue Coumadin and metoprolol, and discuss INR range adjustment with the cardiologist.  Status Post Valve Replacement   After undergoing aortic and mitral valve replacement in 2019 due to rheumatic heart disease, she is followed by Dr. Jens Som with regular INR checks every  4-6 weeks. It is necessary to maintain INR within the therapeutic range to prevent thromboembolic events and manage risks associated with mechanical valves. Continue follow-up with Dr. Jens Som and monitor INR regularly.  Bleeding Ulcer   Managed by Dr. Marca Ancona in 2020, she is currently on Protonix. The importance of monitoring for gastrointestinal symptoms and risks of recurrent bleeding, especially with anticoagulation therapy, was discussed. Continue Protonix and follow up with Dr. Marca Ancona as needed.  Hypertension   Currently managed with metoprolol, blood pressure was elevated today as medication was not taken this morning. The importance of medication adherence and regular monitoring was discussed. Continue metoprolol and monitor blood pressure regularly.  Family History of Cardiovascular Disease   There is a significant family history, including a father with stroke and heart attack. The importance of regular cardiovascular health monitoring and lifestyle modifications to mitigate risk was discussed. Monitor cardiovascular health closely.  General Health Maintenance   At 63 years old, she is overdue for a Pap smear, mammogram, and bone density scan. The importance of these screenings and potential risks of neglecting them was discussed. Order a mammogram and bone density scan, refer to OB/GYN for a Pap smear, and administer tetanus and pneumonia vaccines.  Preventive Vaccinations   She has not received the shingles vaccine and is interested but prefers to delay due to an upcoming family visit. The benefits and potential side effects, including flu-like symptoms, were discussed. Administer the shingles  vaccine at a later date.  Follow-up   Schedule a mammogram and bone density scan, an appointment with OB/GYN for a Pap smear, follow up with Dr. Jens Som in March, follow up with Dr. Marca Ancona as needed, and schedule a colonoscopy for September.        Return if symptoms worsen or fail to improve.    Donato Schultz, DO

## 2023-04-25 LAB — HEPATITIS C ANTIBODY: Hepatitis C Ab: NONREACTIVE

## 2023-04-29 ENCOUNTER — Encounter: Payer: Self-pay | Admitting: Cardiology

## 2023-04-29 ENCOUNTER — Telehealth: Payer: Self-pay | Admitting: Cardiology

## 2023-04-29 NOTE — Telephone Encounter (Signed)
 Patient identification verified by 2 forms. Shade Flood, RN     Called and spoke to patient  Patient states:  - yesterday afternoon saw black lines horizontal which lasted for greater than 1hr - woke up today and black lines were gone but vision in left eye was blurry. - patient is taking all medications as prescribed.  - She has INR check appt on Friday 2/28 and already scheduled with opthalmology on Thursday 2/27.   Patient denies:  - dizziness, headache, SOB, chest pain, nausea, vomiting, new or worsening bruising, unexplained bleeding, numbness, fatigue, or new rash.              Interventions/Plan: - Patient will check her BP and send reading via mychart. She did not have machine available at time of call.  - Recommended patient be seen by primary ophthalmologist.   Update : Patient provided BPS from today. Taken apart.  119/64 133/74 123/60  - Information reviewed with DOD. Per DOD Nicki Guadalajara, MD) patient should keep INR appt and appt with eye doctor this week. No further changes at this time.    Reviewed ED warning signs/precautions  Patient agrees with plan, no questions at this time

## 2023-04-29 NOTE — Telephone Encounter (Signed)
 Pt c/o medication issue:  1. Name of Medication: warfarin (COUMADIN) 5 MG tablet   2. How are you currently taking this medication (dosage and times per day)?  TAKE 1 TO 1&1/2 TABLETS BY MOUTH DAILY AS DIRECTED BY THE COUMADIN CLINIC.     3. Are you having a reaction (difficulty breathing--STAT)? No  4. What is your medication issue? Pt is requesting a callback regarding her rubbing her eye yesterday and she stated when on this medication you shouldn't rub your eyes due to you possibly busting a blood vessel. Well now her vision is blurry and she believes that may have happened to her so she'd like to know if she should be advised by her cardiologist or contact her eye doctor. Please advise

## 2023-05-01 ENCOUNTER — Encounter: Payer: Self-pay | Admitting: Family Medicine

## 2023-05-02 DIAGNOSIS — H33322 Round hole, left eye: Secondary | ICD-10-CM | POA: Diagnosis not present

## 2023-05-02 DIAGNOSIS — H3562 Retinal hemorrhage, left eye: Secondary | ICD-10-CM | POA: Diagnosis not present

## 2023-05-03 ENCOUNTER — Ambulatory Visit: Payer: BC Managed Care – PPO

## 2023-05-03 ENCOUNTER — Encounter: Payer: Self-pay | Admitting: Family Medicine

## 2023-05-09 ENCOUNTER — Inpatient Hospital Stay (HOSPITAL_BASED_OUTPATIENT_CLINIC_OR_DEPARTMENT_OTHER): Admission: RE | Admit: 2023-05-09 | Payer: BC Managed Care – PPO | Source: Ambulatory Visit

## 2023-05-15 DIAGNOSIS — H31092 Other chorioretinal scars, left eye: Secondary | ICD-10-CM | POA: Diagnosis not present

## 2023-05-16 ENCOUNTER — Ambulatory Visit (HOSPITAL_BASED_OUTPATIENT_CLINIC_OR_DEPARTMENT_OTHER)

## 2023-05-16 ENCOUNTER — Encounter (HOSPITAL_BASED_OUTPATIENT_CLINIC_OR_DEPARTMENT_OTHER): Payer: Self-pay

## 2023-05-16 ENCOUNTER — Ambulatory Visit (HOSPITAL_BASED_OUTPATIENT_CLINIC_OR_DEPARTMENT_OTHER)
Admission: RE | Admit: 2023-05-16 | Discharge: 2023-05-16 | Disposition: A | Discharge status: Home / Self Care | Source: Ambulatory Visit | Attending: Family Medicine | Admitting: Family Medicine

## 2023-05-16 DIAGNOSIS — E2839 Other primary ovarian failure: Secondary | ICD-10-CM | POA: Insufficient documentation

## 2023-05-16 DIAGNOSIS — Z1231 Encounter for screening mammogram for malignant neoplasm of breast: Secondary | ICD-10-CM | POA: Diagnosis not present

## 2023-05-16 NOTE — Progress Notes (Signed)
 HPI: Follow-up atrial fibrillation and MVR/AVR.  Patient admitted January 2019 with new onset atrial fibrillation and congestive heart failure.  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  Preoperative cardiac catheterization showed normal coronary arteries.  Preoperative carotid Dopplers showed no stenosis.  Patient subsequently underwent aortic valve replacement and mitral valve replacement with mechanical valves and a Maze procedure with ligation of left atrial appendage.  GI bleed September 2020 and found to have ulcer and polyps.  Echocardiogram January 2023 showed ejection fraction 40 to 45%, severe left atrial enlargement, status post mitral valve replacement with mean gradient 6 mmHg and no mitral regurgitation, status post aortic valve replacement with mild aortic insufficiency and mildly dilated aortic root at 41 mm.  Since last seen, patient denies dyspnea, chest pain, palpitations, syncope or bleeding.  Current Outpatient Medications  Medication Sig Dispense Refill   acetaminophen (TYLENOL) 325 MG tablet Take 2 tablets (650 mg total) by mouth every 6 (six) hours as needed for mild pain.     aspirin EC 81 MG tablet Take 1 tablet (81 mg total) by mouth daily. 90 tablet 3   metoprolol succinate (TOPROL-XL) 25 MG 24 hr tablet TAKE 1 TABLET (25 MG TOTAL) BY MOUTH DAILY. 90 tablet 3   warfarin (COUMADIN) 5 MG tablet TAKE 1 TO 1&1/2 TABLETS BY MOUTH DAILY AS DIRECTED BY THE COUMADIN CLINIC. 120 tablet 1   pantoprazole (PROTONIX) 40 MG tablet Take 1 tablet (40 mg total) by mouth 3 (three) times a week. 60 tablet 1   No current facility-administered medications for this visit.     Past Medical History:  Diagnosis Date   Mitral valve disorder    "leaky" valve diagnosed 30+ yr ago    Past Surgical History:   Procedure Laterality Date   AORTIC VALVE REPLACEMENT N/A 04/04/2017   Procedure: AORTIC VALVE REPLACEMENT (AVR);  Surgeon: Alleen Borne, MD;  Location: [facility] OR;  Service: Open Heart Surgery;  Laterality: N/A;   BIOPSY  11/21/2018   Procedure: BIOPSY;  Surgeon: Kerin Salen, MD;  Location: [facility] ENDOSCOPY;  Service: Gastroenterology;;   CARDIAC SURGERY     COLONOSCOPY WITH PROPOFOL N/A 11/21/2018   Procedure: COLONOSCOPY WITH PROPOFOL;  Surgeon: Kerin Salen, MD;  Location: [facility] ENDOSCOPY;  Service: Gastroenterology;  Laterality: N/A;   ESOPHAGOGASTRODUODENOSCOPY N/A 11/20/2018   Procedure: ESOPHAGOGASTRODUODENOSCOPY (EGD);  Surgeon: Kerin Salen, MD;  Location: [facility] ENDOSCOPY;  Service: Gastroenterology;  Laterality: N/A;   HOT HEMOSTASIS N/A 11/20/2018   Procedure: HOT HEMOSTASIS (ARGON PLASMA COAGULATION/BICAP);  Surgeon: Kerin Salen, MD;  Location: [facility] ENDOSCOPY;  Service: Gastroenterology;  Laterality: N/A;   MAZE N/A 04/04/2017   Procedure: MAZE;  Surgeon: Alleen Borne, MD;  Location: [facility] OR;  Service: Open Heart Surgery;  Laterality: N/A;   MITRAL VALVE REPLACEMENT N/A 04/04/2017   Procedure: MITRAL VALVE (MV) REPLACEMENT;  Surgeon: Alleen Borne, MD;  Location: [facility] OR;  Service: Open Heart Surgery;  Laterality: N/A;   No prior surgery     POLYPECTOMY  11/21/2018   Procedure: POLYPECTOMY;  Surgeon: Kerin Salen, MD;  Location: [facility] ENDOSCOPY;  Service: Gastroenterology;;   RIGHT/LEFT HEART CATH AND CORONARY ANGIOGRAPHY N/A 04/01/2017   Procedure: RIGHT/LEFT HEART CATH AND CORONARY ANGIOGRAPHY;  Surgeon: Runell Gess, MD;  Location: [facility] INVASIVE CV LAB;  Service: Cardiovascular;  Laterality: N/A;   TEE WITHOUT CARDIOVERSION N/A 04/04/2017  Procedure: TRANSESOPHAGEAL ECHOCARDIOGRAM (TEE);  Surgeon: Alleen Borne, MD;  Location: [facility] OR;  Service: Open Heart Surgery;  Laterality: N/A;    Social History   Socioeconomic History   Marital status: Married    Spouse name: Not on file   Number of  children: 3   Years of education: Not on file   Highest education level: Not on file  Occupational History   Not on file  Tobacco Use   Smoking status: Former    Current packs/day: 0.00    Types: Cigarettes    Quit date: 03/30/2017    Years since quitting: 6.1   Smokeless tobacco: Never  Vaping Use   Vaping status: Never Used  Substance and Sexual Activity   Alcohol use: No   Drug use: No   Sexual activity: Yes    Partners: Male  Other Topics Concern   Not on file  Social History Narrative   Exercise----- walking pad , daily    Social Drivers of Health   Financial Resource Strain: Not on file  Food Insecurity: Not on file  Transportation Needs: Not on file  Physical Activity: Not on file  Stress: Not on file  Social Connections: Not on file  Intimate Partner Violence: Not on file    Family History  Problem Relation Age of Onset   Hypertension Mother    COPD Mother    Hypertension Father    CVA Father 22   Heart attack Father 35   Skin cancer Sister        melanoma    ROS: no fevers or chills, productive cough, hemoptysis, dysphasia, odynophagia, melena, hematochezia, dysuria, hematuria, rash, seizure activity, orthopnea, PND, pedal edema, claudication. Remaining systems are negative.  Physical Exam: Well-developed well-nourished in no acute distress.  Skin is warm and dry.  HEENT is normal.  Neck is supple.  Chest is clear to auscultation with normal expansion.  Cardiovascular exam is regular rate and rhythm.  Crisp mechanical valve sounds. Abdominal exam nontender or distended. No masses palpated. Extremities show no edema. neuro grossly intact  EKG Interpretation Date/Time:  Wednesday May 29 2023 09:07:14 EDT Ventricular Rate:  71 PR Interval:  146 QRS Duration:  84 QT Interval:  402 QTC Calculation: 436 R Axis:   66  Text Interpretation: Normal sinus rhythm Normal ECG When compared with ECG of 18-Nov-2018 17:18, PREVIOUS ECG IS PRESENT Confirmed  by Olga Millers ([id number]) on 05/29/2023 9:11:29 AM     A/P  1 status post aortic and mitral valve replacement-continue Coumadin and aspirin.  Continue SBE prophylaxis.  Most recent echocardiogram showed normally functioning mitral valve and aortic valve prostheses.  Plan repeat study.  2 chronic combined systolic/diastolic congestive heart failure-continue low-dose beta-blocker.  Repeat echocardiogram.  Note her blood pressure is elevated.  I am adding losartan 50 mg daily both for LV dysfunction and blood pressure.  Check potassium and renal function in 1 week.  3 paroxysmal atrial fibrillation-continue Coumadin.  Note she had a Maze procedure at time of previous valve surgery.  4 hyperlipidemia-most recent LDL 187.  She is scheduled to see her primary care physician back and have this rechecked in 3 months.  If it remains at this level I would recommend statin therapy.  Olga Millers, MD

## 2023-05-21 ENCOUNTER — Other Ambulatory Visit: Payer: Self-pay | Admitting: Family Medicine

## 2023-05-21 ENCOUNTER — Encounter: Payer: Self-pay | Admitting: Family Medicine

## 2023-05-21 ENCOUNTER — Telehealth (HOSPITAL_BASED_OUTPATIENT_CLINIC_OR_DEPARTMENT_OTHER): Payer: Self-pay

## 2023-05-21 DIAGNOSIS — R928 Other abnormal and inconclusive findings on diagnostic imaging of breast: Secondary | ICD-10-CM

## 2023-05-22 ENCOUNTER — Ambulatory Visit: Attending: Cardiovascular Disease

## 2023-05-22 DIAGNOSIS — I480 Paroxysmal atrial fibrillation: Secondary | ICD-10-CM

## 2023-05-22 DIAGNOSIS — Z952 Presence of prosthetic heart valve: Secondary | ICD-10-CM

## 2023-05-22 LAB — POCT INR: INR: 4.5 — AB (ref 2.0–3.0)

## 2023-05-22 NOTE — Patient Instructions (Addendum)
 Description   DO NOT PRINT. HOLD today's dose and then continue taking warfarin 1 tablet daily except 1.5 tablets on Monday and Friday. Stay consistent with greens each week  Recheck in 2 weeks  Anticoagulation Clinic [phone]

## 2023-05-29 ENCOUNTER — Ambulatory Visit: Payer: BC Managed Care – PPO | Attending: Cardiology | Admitting: Cardiology

## 2023-05-29 ENCOUNTER — Encounter: Payer: Self-pay | Admitting: Cardiology

## 2023-05-29 VITALS — BP 136/92 | HR 71 | Ht 66.0 in | Wt 183.0 lb

## 2023-05-29 DIAGNOSIS — Z952 Presence of prosthetic heart valve: Secondary | ICD-10-CM

## 2023-05-29 DIAGNOSIS — I10 Essential (primary) hypertension: Secondary | ICD-10-CM

## 2023-05-29 DIAGNOSIS — I480 Paroxysmal atrial fibrillation: Secondary | ICD-10-CM | POA: Diagnosis not present

## 2023-05-29 MED ORDER — LOSARTAN POTASSIUM 50 MG PO TABS: 50.0000 mg | ORAL_TABLET | Freq: Every day | ORAL | 3 refills | Status: AC

## 2023-05-29 NOTE — Patient Instructions (Signed)
 Medication Instructions:   START LOSARTAN 50 MG ONCE DAILY  *If you need a refill on your cardiac medications before your next appointment, please call your pharmacy*   Lab Work:  Your physician recommends that you return for lab work in: ONE WEEK-DO NOT NEED TO FAST  If you have labs (blood work) drawn today and your tests are completely normal, you will receive your results only by: MyChart Message (if you have MyChart) OR A paper copy in the mail If you have any lab test that is abnormal or we need to change your treatment, we will call you to review the results.   Testing/Procedures:  Your physician has requested that you have an echocardiogram. Echocardiography is a painless test that uses sound waves to create images of your heart. It provides your doctor with information about the size and shape of your heart and how well your heart's chambers and valves are working. This procedure takes approximately one hour. There are no restrictions for this procedure. Please do NOT wear cologne, perfume, aftershave, or lotions (deodorant is allowed). Please arrive 15 minutes prior to your appointment time.  Please note: We ask at that you not bring children with you during ultrasound (echo/ vascular) testing. Due to room size and safety concerns, children are not allowed in the ultrasound rooms during exams. Our front office staff cannot provide observation of children in our lobby area while testing is being conducted. An adult accompanying a patient to their appointment will only be allowed in the ultrasound room at the discretion of the ultrasound technician under special circumstances. We apologize for any inconvenience. [address]-SCHEDULE IN 3 MONTHS   Follow-Up: At [facility], you and your health needs are our priority.  As part of our continuing mission to provide you with exceptional heart care, we have created designated Provider Care Teams.  These Care Teams  include your primary Cardiologist (physician) and Advanced Practice Providers (APPs -  Physician Assistants and Nurse Practitioners) who all work together to provide you with the care you need, when you need it.    Your next appointment:   12 month(s)  Provider:   Olga Millers, MD

## 2023-06-05 ENCOUNTER — Other Ambulatory Visit: Payer: Self-pay | Admitting: Family Medicine

## 2023-06-05 ENCOUNTER — Ambulatory Visit
Admission: RE | Admit: 2023-06-05 | Discharge: 2023-06-05 | Disposition: A | Discharge status: Home / Self Care | Source: Ambulatory Visit | Attending: Family Medicine | Admitting: Family Medicine

## 2023-06-05 ENCOUNTER — Ambulatory Visit

## 2023-06-05 DIAGNOSIS — R928 Other abnormal and inconclusive findings on diagnostic imaging of breast: Secondary | ICD-10-CM

## 2023-06-05 DIAGNOSIS — N6489 Other specified disorders of breast: Secondary | ICD-10-CM

## 2023-06-06 ENCOUNTER — Ambulatory Visit: Attending: Internal Medicine | Admitting: *Deleted

## 2023-06-06 DIAGNOSIS — Z952 Presence of prosthetic heart valve: Secondary | ICD-10-CM | POA: Diagnosis not present

## 2023-06-06 DIAGNOSIS — I480 Paroxysmal atrial fibrillation: Secondary | ICD-10-CM

## 2023-06-06 DIAGNOSIS — I10 Essential (primary) hypertension: Secondary | ICD-10-CM | POA: Diagnosis not present

## 2023-06-06 LAB — BASIC METABOLIC PANEL WITH GFR
BUN/Creatinine Ratio: 23 (ref 12–28)
BUN: 20 mg/dL (ref 8–27)
CO2: 25 mmol/L (ref 20–29)
Calcium: 9.6 mg/dL (ref 8.7–10.3)
Chloride: 103 mmol/L (ref 96–106)
Creatinine, Ser: 0.87 mg/dL (ref 0.57–1.00)
Glucose: 59 mg/dL — ABNORMAL LOW (ref 70–99)
Potassium: 4.5 mmol/L (ref 3.5–5.2)
Sodium: 141 mmol/L (ref 134–144)
eGFR: 74 mL/min/{1.73_m2} (ref >59)

## 2023-06-06 LAB — POCT INR: POC INR: 3.3

## 2023-06-06 NOTE — Patient Instructions (Addendum)
 Description   DO NOT PRINT. Continue taking warfarin 1 tablet daily except 1.5 tablets on Monday and Friday. Stay consistent with greens each week  Recheck in 4 weeks  Anticoagulation Clinic [phone]

## 2023-06-07 ENCOUNTER — Encounter: Payer: Self-pay | Admitting: *Deleted

## 2023-06-12 DIAGNOSIS — H4312 Vitreous hemorrhage, left eye: Secondary | ICD-10-CM | POA: Diagnosis not present

## 2023-06-12 DIAGNOSIS — H31092 Other chorioretinal scars, left eye: Secondary | ICD-10-CM | POA: Diagnosis not present

## 2023-07-05 ENCOUNTER — Ambulatory Visit: Attending: Cardiology

## 2023-07-05 DIAGNOSIS — I480 Paroxysmal atrial fibrillation: Secondary | ICD-10-CM

## 2023-07-05 DIAGNOSIS — I4892 Unspecified atrial flutter: Secondary | ICD-10-CM

## 2023-07-05 DIAGNOSIS — I050 Rheumatic mitral stenosis: Secondary | ICD-10-CM

## 2023-07-05 DIAGNOSIS — Z952 Presence of prosthetic heart valve: Secondary | ICD-10-CM

## 2023-07-05 DIAGNOSIS — Z5181 Encounter for therapeutic drug level monitoring: Secondary | ICD-10-CM | POA: Diagnosis not present

## 2023-07-05 LAB — POCT INR: INR: 2.7 (ref 2.0–3.0)

## 2023-07-05 NOTE — Patient Instructions (Signed)
 DO NOT PRINT. Take 2 tablets today only then Continue taking warfarin 1 tablet daily except 1.5 tablets on Monday and Friday. Stay consistent with greens each week  Recheck in 6 weeks  Anticoagulation Clinic [phone]

## 2023-07-19 ENCOUNTER — Telehealth: Payer: Self-pay

## 2023-07-19 NOTE — Telephone Encounter (Signed)
 Called patient to schedule new patient appointment. Left voicemail with our contact information to call back and schedule.

## 2023-07-22 ENCOUNTER — Other Ambulatory Visit: Payer: Self-pay | Admitting: Cardiology

## 2023-07-22 DIAGNOSIS — I480 Paroxysmal atrial fibrillation: Secondary | ICD-10-CM

## 2023-08-08 ENCOUNTER — Other Ambulatory Visit: Payer: Self-pay | Admitting: Family Medicine

## 2023-08-08 ENCOUNTER — Telehealth: Payer: Self-pay

## 2023-08-08 DIAGNOSIS — E785 Hyperlipidemia, unspecified: Secondary | ICD-10-CM

## 2023-08-08 NOTE — Telephone Encounter (Signed)
 Copied from CRM [id number]. Topic: Clinical - Request for Lab/Test Order >> Aug 07, 2023  3:32 PM Baldo Levan wrote: Reason for CRM: Patient called in stating it has been three months since her last Cholesterol lab draw and Dr. Jalene Mayor wanted to re check it in three months. Patient is requesting the orders to be put in so she can schedule.

## 2023-08-08 NOTE — Telephone Encounter (Signed)
 Please advise of labs that need to be ordered and I will get patient scheduled.

## 2023-08-09 NOTE — Telephone Encounter (Signed)
 Pt called and scheduled for 06/13

## 2023-08-13 ENCOUNTER — Other Ambulatory Visit: Payer: Self-pay | Admitting: Cardiology

## 2023-08-13 DIAGNOSIS — I480 Paroxysmal atrial fibrillation: Secondary | ICD-10-CM

## 2023-08-13 DIAGNOSIS — I10 Essential (primary) hypertension: Secondary | ICD-10-CM

## 2023-08-13 DIAGNOSIS — I359 Nonrheumatic aortic valve disorder, unspecified: Secondary | ICD-10-CM

## 2023-08-13 DIAGNOSIS — Z952 Presence of prosthetic heart valve: Secondary | ICD-10-CM

## 2023-08-16 ENCOUNTER — Ambulatory Visit: Attending: Cardiology

## 2023-08-16 ENCOUNTER — Other Ambulatory Visit (INDEPENDENT_AMBULATORY_CARE_PROVIDER_SITE_OTHER)

## 2023-08-16 DIAGNOSIS — Z952 Presence of prosthetic heart valve: Secondary | ICD-10-CM

## 2023-08-16 DIAGNOSIS — I4892 Unspecified atrial flutter: Secondary | ICD-10-CM | POA: Diagnosis not present

## 2023-08-16 DIAGNOSIS — E785 Hyperlipidemia, unspecified: Secondary | ICD-10-CM

## 2023-08-16 DIAGNOSIS — I050 Rheumatic mitral stenosis: Secondary | ICD-10-CM

## 2023-08-16 DIAGNOSIS — I480 Paroxysmal atrial fibrillation: Secondary | ICD-10-CM

## 2023-08-16 DIAGNOSIS — Z5181 Encounter for therapeutic drug level monitoring: Secondary | ICD-10-CM | POA: Diagnosis not present

## 2023-08-16 LAB — POCT INR: INR: 2.5 (ref 2.0–3.0)

## 2023-08-16 NOTE — Patient Instructions (Signed)
 DO NOT PRINT. Take 2 tablets today only then Increase to 1 tablet daily except 1.5 tablets on Monday, Wednesday and Friday. Stay consistent with greens each week  Recheck in 4 weeks  Anticoagulation Clinic [phone]

## 2023-08-16 NOTE — Addendum Note (Signed)
 Addended by: Susa Engman A on: 08/16/2023 09:37 AM   Modules accepted: Orders

## 2023-08-17 LAB — LIPID PANEL
Cholesterol: 240 mg/dL — ABNORMAL HIGH (ref <200)
HDL: 58 mg/dL (ref >=50)
LDL Cholesterol (Calc): 159 mg/dL — ABNORMAL HIGH
Non-HDL Cholesterol (Calc): 182 mg/dL — ABNORMAL HIGH (ref <130)
Total CHOL/HDL Ratio: 4.1 (calc) (ref <5.0)
Triglycerides: 113 mg/dL (ref <150)

## 2023-08-17 LAB — COMPREHENSIVE METABOLIC PANEL WITH GFR
AG Ratio: 1.9 (calc) (ref 1.0–2.5)
ALT: 12 U/L (ref 6–29)
AST: 17 U/L (ref 10–35)
Albumin: 4.5 g/dL (ref 3.6–5.1)
Alkaline phosphatase (APISO): 87 U/L (ref 37–153)
BUN: 19 mg/dL (ref 7–25)
CO2: 27 mmol/L (ref 20–32)
Calcium: 9.6 mg/dL (ref 8.6–10.4)
Chloride: 104 mmol/L (ref 98–110)
Creat: 0.84 mg/dL (ref 0.50–1.05)
Globulin: 2.4 g/dL (ref 1.9–3.7)
Glucose, Bld: 86 mg/dL (ref 65–99)
Potassium: 4.8 mmol/L (ref 3.5–5.3)
Sodium: 140 mmol/L (ref 135–146)
Total Bilirubin: 0.7 mg/dL (ref 0.2–1.2)
Total Protein: 6.9 g/dL (ref 6.1–8.1)
eGFR: 77 mL/min/{1.73_m2} (ref >=60)

## 2023-08-24 ENCOUNTER — Ambulatory Visit: Payer: Self-pay | Admitting: Family Medicine

## 2023-08-27 ENCOUNTER — Ambulatory Visit (HOSPITAL_BASED_OUTPATIENT_CLINIC_OR_DEPARTMENT_OTHER)
Admission: RE | Admit: 2023-08-27 | Discharge: 2023-08-27 | Disposition: A | Discharge status: Home / Self Care | Source: Ambulatory Visit | Attending: Cardiology | Admitting: Cardiology

## 2023-08-27 ENCOUNTER — Ambulatory Visit: Payer: Self-pay | Admitting: Cardiology

## 2023-08-27 DIAGNOSIS — I359 Nonrheumatic aortic valve disorder, unspecified: Secondary | ICD-10-CM | POA: Diagnosis not present

## 2023-08-27 DIAGNOSIS — I480 Paroxysmal atrial fibrillation: Secondary | ICD-10-CM | POA: Insufficient documentation

## 2023-08-27 LAB — ECHOCARDIOGRAM COMPLETE
AR max vel: 0.83 cm2
AV Area VTI: 0.94 cm2
AV Area mean vel: 0.82 cm2
AV Mean grad: 15.3 mmHg
AV Peak grad: 27.7 mmHg
Ao pk vel: 2.63 m/s
Area-P 1/2: 3.39 cm2
Calc EF: 65.8 %
MV VTI: 1.34 cm2
S' Lateral: 2.8 cm
Single Plane A2C EF: 67.5 %
Single Plane A4C EF: 66 %

## 2023-09-13 ENCOUNTER — Ambulatory Visit

## 2023-09-23 ENCOUNTER — Ambulatory Visit: Attending: Cardiology

## 2023-09-23 DIAGNOSIS — Z952 Presence of prosthetic heart valve: Secondary | ICD-10-CM

## 2023-09-23 DIAGNOSIS — I480 Paroxysmal atrial fibrillation: Secondary | ICD-10-CM | POA: Diagnosis not present

## 2023-09-23 DIAGNOSIS — I4892 Unspecified atrial flutter: Secondary | ICD-10-CM

## 2023-09-23 DIAGNOSIS — Z5181 Encounter for therapeutic drug level monitoring: Secondary | ICD-10-CM | POA: Diagnosis not present

## 2023-09-23 DIAGNOSIS — I050 Rheumatic mitral stenosis: Secondary | ICD-10-CM

## 2023-09-23 LAB — POCT INR: INR: 4 — AB (ref 2.0–3.0)

## 2023-09-23 NOTE — Progress Notes (Signed)
 INR 4.0; Please see anticoagulation encounter

## 2023-09-23 NOTE — Patient Instructions (Signed)
 DO NOT PRINT. Continue 1 tablet daily except 1.5 tablets on Monday, Wednesday and Friday. Stay consistent with greens each week Eat greens tonight. Recheck in 4 weeks  Anticoagulation Clinic [phone]

## 2023-10-10 DIAGNOSIS — H31092 Other chorioretinal scars, left eye: Secondary | ICD-10-CM | POA: Diagnosis not present

## 2023-10-10 DIAGNOSIS — H4312 Vitreous hemorrhage, left eye: Secondary | ICD-10-CM | POA: Diagnosis not present

## 2023-10-10 DIAGNOSIS — H43812 Vitreous degeneration, left eye: Secondary | ICD-10-CM | POA: Diagnosis not present

## 2023-10-25 ENCOUNTER — Ambulatory Visit: Attending: Cardiology

## 2023-10-25 DIAGNOSIS — I480 Paroxysmal atrial fibrillation: Secondary | ICD-10-CM

## 2023-10-25 DIAGNOSIS — Z952 Presence of prosthetic heart valve: Secondary | ICD-10-CM

## 2023-10-25 DIAGNOSIS — Z5181 Encounter for therapeutic drug level monitoring: Secondary | ICD-10-CM

## 2023-10-25 DIAGNOSIS — I4892 Unspecified atrial flutter: Secondary | ICD-10-CM | POA: Diagnosis not present

## 2023-10-25 DIAGNOSIS — I050 Rheumatic mitral stenosis: Secondary | ICD-10-CM

## 2023-10-25 LAB — POCT INR: INR: 3.7 — AB (ref 2.0–3.0)

## 2023-10-25 NOTE — Progress Notes (Signed)
 INR 3.7 Please see anticoagulation encounter  DO NOT PRINT. Continue 1 tablet daily except 1.5 tablets on Monday, Wednesday and Friday. Stay consistent with greens each week. Eat greens tonight. Recheck in 6 weeks  Anticoagulation Clinic [phone]

## 2023-10-25 NOTE — Patient Instructions (Signed)
 DO NOT PRINT. Continue 1 tablet daily except 1.5 tablets on Monday, Wednesday and Friday. Stay consistent with greens each week. Eat greens tonight. Recheck in 6 weeks  Anticoagulation Clinic [phone]

## 2023-12-06 ENCOUNTER — Ambulatory Visit
Admission: RE | Admit: 2023-12-06 | Discharge: 2023-12-06 | Disposition: A | Discharge status: Home / Self Care | Source: Ambulatory Visit | Attending: Family Medicine | Admitting: Family Medicine

## 2023-12-06 ENCOUNTER — Ambulatory Visit: Attending: Cardiology | Admitting: Pharmacist

## 2023-12-06 ENCOUNTER — Ambulatory Visit

## 2023-12-06 DIAGNOSIS — Z952 Presence of prosthetic heart valve: Secondary | ICD-10-CM

## 2023-12-06 DIAGNOSIS — R928 Other abnormal and inconclusive findings on diagnostic imaging of breast: Secondary | ICD-10-CM | POA: Diagnosis not present

## 2023-12-06 DIAGNOSIS — I480 Paroxysmal atrial fibrillation: Secondary | ICD-10-CM | POA: Diagnosis not present

## 2023-12-06 DIAGNOSIS — I4892 Unspecified atrial flutter: Secondary | ICD-10-CM | POA: Diagnosis not present

## 2023-12-06 DIAGNOSIS — N6489 Other specified disorders of breast: Secondary | ICD-10-CM

## 2023-12-06 DIAGNOSIS — I050 Rheumatic mitral stenosis: Secondary | ICD-10-CM

## 2023-12-06 DIAGNOSIS — Z5181 Encounter for therapeutic drug level monitoring: Secondary | ICD-10-CM

## 2023-12-06 LAB — POCT INR: INR: 3.4 — AB (ref 2.0–3.0)

## 2023-12-06 NOTE — Patient Instructions (Signed)
 Description   DO NOT PRINT. Continue 1 tablet daily except 1.5 tablets on Monday, Wednesday and Friday. Stay consistent with greens each week.  Recheck in 6 weeks  Anticoagulation Clinic [phone]

## 2023-12-06 NOTE — Progress Notes (Signed)
 Description   DO NOT PRINT. Continue 1 tablet daily except 1.5 tablets on Monday, Wednesday and Friday. Stay consistent with greens each week.  Recheck in 6 weeks  Anticoagulation Clinic [phone]

## 2023-12-19 ENCOUNTER — Other Ambulatory Visit: Payer: Self-pay | Admitting: Family Medicine

## 2023-12-19 DIAGNOSIS — N6489 Other specified disorders of breast: Secondary | ICD-10-CM

## 2023-12-26 ENCOUNTER — Other Ambulatory Visit: Payer: Self-pay

## 2023-12-26 MED ORDER — WARFARIN SODIUM 5 MG PO TABS: ORAL_TABLET | ORAL | 1 refills | Status: DC

## 2023-12-30 DIAGNOSIS — I4891 Unspecified atrial fibrillation: Secondary | ICD-10-CM | POA: Diagnosis not present

## 2023-12-30 DIAGNOSIS — Z860101 Personal history of adenomatous and serrated colon polyps: Secondary | ICD-10-CM | POA: Diagnosis not present

## 2023-12-31 ENCOUNTER — Telehealth (HOSPITAL_BASED_OUTPATIENT_CLINIC_OR_DEPARTMENT_OTHER): Payer: Self-pay

## 2023-12-31 NOTE — Telephone Encounter (Signed)
   Pre-operative Risk Assessment    Patient Name: Sharon Figueroa  DOB: November 29, 1960 MRN: [id number]   Date of last office visit: 05/29/23 with Dr. Pietro Date of next office visit: NA  Request for Surgical Clearance    Procedure:  Colonoscopy  Date of Surgery:  Clearance 02/12/24                                 Surgeon:  Dr. Estelita Socks Group or Practice Name:  [facility] Phone number:  [phone] Fax number:  [phone]   Type of Clearance Requested:   - Medical  - Pharmacy:  Hold Warfarin (Coumadin ) for 5 days and aspirin  not indicated   Type of Anesthesia:  propofol    Additional requests/questions:    [name]   12/31/2023, 9:05 AM

## 2024-01-07 ENCOUNTER — Telehealth (HOSPITAL_BASED_OUTPATIENT_CLINIC_OR_DEPARTMENT_OTHER): Payer: Self-pay | Admitting: *Deleted

## 2024-01-07 NOTE — Telephone Encounter (Signed)
 Pt has been scheduled tele preop appt 01/28/24. Med rec and consent are done.

## 2024-01-07 NOTE — Telephone Encounter (Signed)
 Primary Cardiologist:Brian Pietro, MD   Preoperative team, please contact this patient and set up a phone call appointment for further preoperative risk assessment. Please obtain consent and complete medication review. Thank you for your help.   I confirm that guidance regarding antiplatelet and oral anticoagulation therapy has been completed and, if necessary, noted below.  Per office protocol, patient can hold warfarin for 5 days prior to procedure.   Patient WILL need bridging with Lovenox  (enoxaparin ) around procedure. This will be organized by Coumadin  clinic  I also confirmed the patient resides in the state of [place] . As per [facility] Medical Board telemedicine laws, the patient must reside in the state in which the provider is licensed.   Rosaline EMERSON Bane, NP-C  01/07/2024, 8:19 AM [address], [address] [place], [place] [address] Office [phone] Fax [phone]

## 2024-01-07 NOTE — Telephone Encounter (Addendum)
 Patient with diagnosis of A Fib plus MVR plus AVR on warfarin for anticoagulation.    Procedure: colonoscopy Date of procedure: 12/'10/25   CHA2DS2-VASc Score = 2  This indicates a 2.2% annual risk of stroke. The patient's score is based upon: CHF History: 0 HTN History: 0 Diabetes History: 0 Stroke History: 0 Vascular Disease History: 0 Age Score: 1 Gender Score: 1    CrCl 87 ml/min Platelet count 299K  Patient has/has not  had an Afib/aflutter ablation in the last 3 months, DCCV within the last 4 weeks or a watchman implanted in the last 45 days    Per office protocol, patient can hold warfarin for 5 days prior to procedure.    Patient WILL need bridging with Lovenox  (enoxaparin ) around procedure. This will be organized by Coumadin  clinic  **This guidance is not considered finalized until pre-operative APP has relayed final recommendations.**

## 2024-01-07 NOTE — Telephone Encounter (Signed)
 Left message to call back to schedule tele pre op appt.

## 2024-01-07 NOTE — Addendum Note (Signed)
 Addended by: DARRELL BRUCKNER on: 01/07/2024 08:02 AM   Modules accepted: Orders

## 2024-01-07 NOTE — Telephone Encounter (Signed)
 Pt has been scheduled tele preop appt 01/28/24. Med rec and consent are done.      Patient Consent for Virtual Visit        Sharon Figueroa has provided verbal consent on 01/07/2024 for a virtual visit (video or telephone).   CONSENT FOR VIRTUAL VISIT FOR:  Sharon Figueroa  By participating in this virtual visit I agree to the following:  I hereby voluntarily request, consent and authorize [facility] and its employed or contracted physicians, physician assistants, nurse practitioners or other licensed health care professionals (the Practitioner), to provide me with telemedicine health care services (the "Services) as deemed necessary by the treating Practitioner. I acknowledge and consent to receive the Services by the Practitioner via telemedicine. I understand that the telemedicine visit will involve communicating with the Practitioner through live audiovisual communication technology and the disclosure of certain medical information by electronic transmission. I acknowledge that I have been given the opportunity to request an in-person assessment or other available alternative prior to the telemedicine visit and am voluntarily participating in the telemedicine visit.  I understand that I have the right to withhold or withdraw my consent to the use of telemedicine in the course of my care at any time, without affecting my right to future care or treatment, and that the Practitioner or I may terminate the telemedicine visit at any time. I understand that I have the right to inspect all information obtained and/or recorded in the course of the telemedicine visit and may receive copies of available information for a reasonable fee.  I understand that some of the potential risks of receiving the Services via telemedicine include:  Delay or interruption in medical evaluation due to technological equipment failure or disruption; Information transmitted may not be sufficient (e.g. poor  resolution of images) to allow for appropriate medical decision making by the Practitioner; and/or  In rare instances, security protocols could fail, causing a breach of personal health information.  Furthermore, I acknowledge that it is my responsibility to provide information about my medical history, conditions and care that is complete and accurate to the best of my ability. I acknowledge that Practitioner's advice, recommendations, and/or decision may be based on factors not within their control, such as incomplete or inaccurate data provided by me or distortions of diagnostic images or specimens that may result from electronic transmissions. I understand that the practice of medicine is not an exact science and that Practitioner makes no warranties or guarantees regarding treatment outcomes. I acknowledge that a copy of this consent can be made available to me via my patient portal [facility] MyChart), or I can request a printed copy by calling the office of [facility].    I understand that my insurance will be billed for this visit.   I have read or had this consent read to me. I understand the contents of this consent, which adequately explains the benefits and risks of the Services being provided via telemedicine.  I have been provided ample opportunity to ask questions regarding this consent and the Services and have had my questions answered to my satisfaction. I give my informed consent for the services to be provided through the use of telemedicine in my medical care

## 2024-01-07 NOTE — Telephone Encounter (Signed)
 Patient is returning call.

## 2024-01-17 ENCOUNTER — Ambulatory Visit: Attending: Cardiology

## 2024-01-17 DIAGNOSIS — I480 Paroxysmal atrial fibrillation: Secondary | ICD-10-CM

## 2024-01-17 DIAGNOSIS — Z952 Presence of prosthetic heart valve: Secondary | ICD-10-CM | POA: Diagnosis not present

## 2024-01-17 DIAGNOSIS — I050 Rheumatic mitral stenosis: Secondary | ICD-10-CM

## 2024-01-17 DIAGNOSIS — I4892 Unspecified atrial flutter: Secondary | ICD-10-CM | POA: Diagnosis not present

## 2024-01-17 DIAGNOSIS — Z5181 Encounter for therapeutic drug level monitoring: Secondary | ICD-10-CM

## 2024-01-17 LAB — POCT INR: INR: 3.5 — AB (ref 2.0–3.0)

## 2024-01-17 NOTE — Progress Notes (Signed)
 INR 3.5 Please see anticoagulation encounter DO NOT PRINT. Continue 1 tablet daily except 1.5 tablets on Monday, Wednesday and Friday. Stay consistent with greens each week.  Recheck in 3 weeks  Anticoagulation Clinic [phone]

## 2024-01-17 NOTE — Patient Instructions (Signed)
 DO NOT PRINT. Continue 1 tablet daily except 1.5 tablets on Monday, Wednesday and Friday. Stay consistent with greens each week.  Recheck in 3 weeks  Anticoagulation Clinic [phone]

## 2024-01-28 ENCOUNTER — Ambulatory Visit: Admitting: Cardiology

## 2024-01-28 DIAGNOSIS — Z01810 Encounter for preprocedural cardiovascular examination: Secondary | ICD-10-CM | POA: Diagnosis not present

## 2024-01-28 DIAGNOSIS — Z01818 Encounter for other preprocedural examination: Secondary | ICD-10-CM

## 2024-01-28 NOTE — Progress Notes (Signed)
 Virtual Visit via Telephone Note   Because of Sharon Figueroa co-morbid illnesses, she is at least at moderate risk for complications without adequate follow up.  This format is felt to be most appropriate for this patient at this time.  Due to technical limitations with video connection (technology), today's appointment will be conducted as an audio only telehealth visit, and Sharon Figueroa verbally agreed to proceed in this manner.   All issues noted in this document were discussed and addressed.  No physical exam could be performed with this format.  Evaluation Performed:  Preoperative cardiovascular risk assessment _____________   Date:  01/28/2024   Patient ID:  Sharon Figueroa, DOB 08/05/1960, MRN [id number] Patient Location:  Home Provider location:   Office  Primary Care Provider:  Antonio Meth, Jamee SAUNDERS, DO Primary Cardiologist:  Redell Shallow, MD  Chief Complaint / Patient Profile   63 y.o. y/o female with a h/o atrial fibrillation, moderate aortic regurgitation s/p AVR and maze procedure with LLAA, HFmrEF, MVR s/p replacement who is pending colonoscopy and presents today for telephonic preoperative cardiovascular risk assessment.  History of Present Illness    Sharon Figueroa is a 63 y.o. female who presents via audio/video conferencing for a telehealth visit today.  Pt was last seen in cardiology clinic on 10/29/2023 by Dr. Shallow.  At that time Sharon Figueroa was doing well, blood pressure was elevated and she was started on Losartan  with plans to follow up in 1 year.  The patient is now pending procedure as outlined above. Since her last visit, she continued to do well, she exercises by walking on her lunch break each day, stays very physically active around her house. She denies chest pain, palpitations, dyspnea, pnd, orthopnea, n, v, dizziness, syncope, edema, weight gain, or early satiety.    Past Medical History    Past Medical History:  Diagnosis Date   Mitral valve  disorder    leaky valve diagnosed 30+ yr ago   Past Surgical History:  Procedure Laterality Date   AORTIC VALVE REPLACEMENT N/A 04/04/2017   Procedure: AORTIC VALVE REPLACEMENT (AVR);  Surgeon: Lucas Dorise POUR, MD;  Location: [facility] OR;  Service: Open Heart Surgery;  Laterality: N/A;   BIOPSY  11/21/2018   Procedure: BIOPSY;  Surgeon: Saintclair Jasper, MD;  Location: [facility] ENDOSCOPY;  Service: Gastroenterology;;   CARDIAC SURGERY     COLONOSCOPY WITH PROPOFOL  N/A 11/21/2018   Procedure: COLONOSCOPY WITH PROPOFOL ;  Surgeon: Saintclair Jasper, MD;  Location: [facility] ENDOSCOPY;  Service: Gastroenterology;  Laterality: N/A;   ESOPHAGOGASTRODUODENOSCOPY N/A 11/20/2018   Procedure: ESOPHAGOGASTRODUODENOSCOPY (EGD);  Surgeon: Saintclair Jasper, MD;  Location: [facility] ENDOSCOPY;  Service: Gastroenterology;  Laterality: N/A;   HOT HEMOSTASIS N/A 11/20/2018   Procedure: HOT HEMOSTASIS (ARGON PLASMA COAGULATION/BICAP);  Surgeon: Saintclair Jasper, MD;  Location: [facility] ENDOSCOPY;  Service: Gastroenterology;  Laterality: N/A;   MAZE N/A 04/04/2017   Procedure: MAZE;  Surgeon: Lucas Dorise POUR, MD;  Location: [facility] OR;  Service: Open Heart Surgery;  Laterality: N/A;   MITRAL VALVE REPLACEMENT N/A 04/04/2017   Procedure: MITRAL VALVE (MV) REPLACEMENT;  Surgeon: Lucas Dorise POUR, MD;  Location: [facility] OR;  Service: Open Heart Surgery;  Laterality: N/A;   No prior surgery     POLYPECTOMY  11/21/2018   Procedure: POLYPECTOMY;  Surgeon: Saintclair Jasper, MD;  Location: [facility] ENDOSCOPY;  Service: Gastroenterology;;   RIGHT/LEFT HEART CATH AND CORONARY ANGIOGRAPHY N/A 04/01/2017   Procedure: RIGHT/LEFT HEART CATH AND CORONARY ANGIOGRAPHY;  Surgeon: Court Dorn PARAS, MD;  Location: [facility] INVASIVE CV LAB;  Service: Cardiovascular;  Laterality: N/A;   TEE WITHOUT CARDIOVERSION N/A 04/04/2017   Procedure: TRANSESOPHAGEAL ECHOCARDIOGRAM (TEE);  Surgeon: Lucas Dorise POUR, MD;  Location: [facility] OR;  Service: Open Heart Surgery;  Laterality: N/A;    Allergies  No Known Allergies  Home  Medications    Prior to Admission medications   Medication Sig Start Date End Date Taking? Authorizing Provider  acetaminophen  (TYLENOL ) 325 MG tablet Take 2 tablets (650 mg total) by mouth every 6 (six) hours as needed for mild pain. 04/09/17   Dwan Kyla HERO, PA-C  amoxicillin (AMOXIL) 500 MG capsule Take 2,000 mg by mouth. Patient taking differently: Take 2,000 mg by mouth. PRN FOR DENTAL PROCEDURE 09/09/23   [provider]  aspirin  EC 81 MG tablet Take 1 tablet (81 mg total) by mouth daily. 01/05/19   Pietro Redell RAMAN, MD  losartan  (COZAAR ) 50 MG tablet Take 1 tablet (50 mg total) by mouth daily. 05/29/23 01/07/24  Pietro Redell RAMAN, MD  metoprolol  succinate (TOPROL -XL) 25 MG 24 hr tablet TAKE 1 TABLET (25 MG TOTAL) BY MOUTH DAILY. 07/23/23   Pietro Redell RAMAN, MD  pantoprazole  (PROTONIX ) 40 MG tablet Take 1 tablet (40 mg total) by mouth 3 (three) times a week. 07/24/19 01/07/24  Emelia Josefa HERO, NP  warfarin (COUMADIN ) 5 MG tablet Take 1 to 1&1/2 tablets by mouth daily as directed by the coumadin  clinic. 12/26/23   Pietro Redell RAMAN, MD    Physical Exam    Vital Signs:  Sharon Figueroa does not have vital signs available for review today.  Given telephonic nature of communication, physical exam is limited. AAOx3. NAD. Normal affect.  Speech and respirations are unlabored.  Accessory Clinical Findings    None  Assessment & Plan    1.  Preoperative Cardiovascular Risk Assessment:     Sharon Figueroa perioperative risk of a major cardiac event is 0.4% according to the Revised Cardiac Risk Index (RCRI).  Therefore, she is at low risk for perioperative complications.   Her functional capacity is excellent at 6.05 METs according to the Duke Activity Status Index (DASI).   Antiplatelet and/or Anticoagulation Recommendations:  Coumadin  can by held for 5 days prior to surgery.  Please resume post op when felt to be safe. The patient will require Lovenox  bridging while off of  Coumadin .  She will see the Coumadin  Clinic on 02/04/2024 for Lovenox  bridge instructions.     The patient was advised that if she develops new symptoms prior to surgery to contact our office to arrange for a follow-up visit, and she verbalized understanding.    A copy of this note will be routed to requesting surgeon.  Time:   Today, I have spent 10 minutes with the patient with telehealth technology discussing medical history, symptoms, and management plan.     Delon JAYSON Hoover, NP  01/28/2024, 11:58 AM

## 2024-02-04 ENCOUNTER — Ambulatory Visit: Attending: Cardiology | Admitting: Pharmacist

## 2024-02-04 DIAGNOSIS — Z5181 Encounter for therapeutic drug level monitoring: Secondary | ICD-10-CM | POA: Diagnosis not present

## 2024-02-04 DIAGNOSIS — I4892 Unspecified atrial flutter: Secondary | ICD-10-CM

## 2024-02-04 DIAGNOSIS — Z952 Presence of prosthetic heart valve: Secondary | ICD-10-CM

## 2024-02-04 DIAGNOSIS — I349 Nonrheumatic mitral valve disorder, unspecified: Secondary | ICD-10-CM | POA: Diagnosis not present

## 2024-02-04 DIAGNOSIS — I480 Paroxysmal atrial fibrillation: Secondary | ICD-10-CM | POA: Diagnosis not present

## 2024-02-04 DIAGNOSIS — I050 Rheumatic mitral stenosis: Secondary | ICD-10-CM

## 2024-02-04 LAB — POCT INR: INR: 3.3 — AB (ref 2.0–3.0)

## 2024-02-04 MED ORDER — ENOXAPARIN SODIUM 120 MG/0.8ML IJ SOSY: 120.0000 mg | PREFILLED_SYRINGE | INTRAMUSCULAR | 0 refills | Status: DC

## 2024-02-04 NOTE — Patient Instructions (Signed)
 Description   DO NOT PRINT. Continue 1 tablet daily except 1.5 tablets on Monday, Wednesday and Friday. Stay consistent with greens each week.  Recheck in 3 weeks  Anticoagulation Clinic [phone]     12/4: Last dose of warfarin.  12/5: No warfarin or enoxaparin  (Lovenox ).  12/6: Inject enoxaparin  120mg  in the fatty abdominal tissue at least 2 inches from the belly button once a day in the morning. No warfarin.  12/7: Inject enoxaparin  in the fatty tissue every 24 hours, No warfarin.  12/8: Inject enoxaparin  in the fatty tissue every 24 hours, No warfarin.  12/9: Inject enoxaparin  in the fatty tissue in the morning No warfarin.  12/10: Procedure Day - No enoxaparin  - Resume warfarin in the evening or as directed by doctor (take an extra half tablet with usual dose for 2 days then resume normal dose).  12/11: Resume enoxaparin  inject in the fatty tissue every 12 hours and take warfarin  12/12: Inject enoxaparin  in the fatty tissue every 12 hours and take warfarin  12/13: Inject enoxaparin  in the fatty tissue every 12 hours and take warfarin  12/14: Inject enoxaparin  in the fatty tissue every 12 hours and take warfarin  12/15: Inject enoxaparin  in the fatty tissue every 12 hours and take warfarin  12/16: warfarin appt to check INR.

## 2024-02-04 NOTE — Progress Notes (Signed)
 Description   DO NOT PRINT.  INR 3.3 Continue 1 tablet daily except 1.5 tablets on Monday, Wednesday and Friday. Follow Lovenox  directions on sheet provided. Stay consistent with greens each week.  Recheck in 3 weeks  Anticoagulation Clinic [phone]

## 2024-02-10 ENCOUNTER — Telehealth: Payer: Self-pay | Admitting: *Deleted

## 2024-02-10 NOTE — Telephone Encounter (Signed)
 Patient called to report that after giving her lovenox  injection on Saturday that the site/area has been bleeding. She states it is not draining blood but  that the bandage fills up and it bleeds again after removing and placing another bandage. Advised to apply some pressure to the area and to leave the bandage on as long as she can before removing and if it continues to bleed that she could go see her PCP to assess the site. Advised to call back tomorrow with an update if needed.

## 2024-02-12 DIAGNOSIS — K648 Other hemorrhoids: Secondary | ICD-10-CM | POA: Diagnosis not present

## 2024-02-12 DIAGNOSIS — Z860101 Personal history of adenomatous and serrated colon polyps: Secondary | ICD-10-CM | POA: Diagnosis not present

## 2024-02-12 DIAGNOSIS — D122 Benign neoplasm of ascending colon: Secondary | ICD-10-CM | POA: Diagnosis not present

## 2024-02-12 DIAGNOSIS — K5730 Diverticulosis of large intestine without perforation or abscess without bleeding: Secondary | ICD-10-CM | POA: Diagnosis not present

## 2024-02-12 DIAGNOSIS — Z09 Encounter for follow-up examination after completed treatment for conditions other than malignant neoplasm: Secondary | ICD-10-CM | POA: Diagnosis not present

## 2024-02-18 ENCOUNTER — Ambulatory Visit

## 2024-02-18 DIAGNOSIS — Z952 Presence of prosthetic heart valve: Secondary | ICD-10-CM | POA: Diagnosis not present

## 2024-02-18 DIAGNOSIS — Z5181 Encounter for therapeutic drug level monitoring: Secondary | ICD-10-CM | POA: Diagnosis not present

## 2024-02-18 DIAGNOSIS — I480 Paroxysmal atrial fibrillation: Secondary | ICD-10-CM | POA: Diagnosis not present

## 2024-02-18 DIAGNOSIS — I4892 Unspecified atrial flutter: Secondary | ICD-10-CM

## 2024-02-18 DIAGNOSIS — I050 Rheumatic mitral stenosis: Secondary | ICD-10-CM

## 2024-02-18 LAB — POCT INR: INR: 1.7 — AB (ref 2.0–3.0)

## 2024-02-18 MED ORDER — ENOXAPARIN SODIUM 120 MG/0.8ML IJ SOSY: 120.0000 mg | PREFILLED_SYRINGE | INTRAMUSCULAR | 0 refills | Status: AC

## 2024-02-18 NOTE — Patient Instructions (Addendum)
°  °  Description   INR 1.7, Take 2 tablets today and tomorrow, then resume Warfarin 1 tablet daily except 1.5 tablets on Mondays, Wednesdays and Fridays. Continue on Lovenox  injections once daily in the am, last dosage on Friday 02/21/24. Will send in 3 additional syringes to pharmacy per pt request instead of 1 box of 10.  Stay consistent with greens each week.  Recheck on Monday 02/24/24. Anticoagulation Clinic [phone]

## 2024-02-18 NOTE — Progress Notes (Signed)
 Description   INR 1.7, Take 2 tablets today and tomorrow, then resume Warfarin 1 tablet daily except 1.5 tablets on Mondays, Wednesdays and Fridays. Continue on Lovenox  injections once daily in the am, last dosage on Friday 02/21/24. Will send in 3 additional syringes to pharmacy per pt request instead of 1 box of 10.  Stay consistent with greens each week.  Recheck on Monday 02/24/24. Anticoagulation Clinic [phone]

## 2024-02-19 LAB — HM COLONOSCOPY

## 2024-02-24 ENCOUNTER — Ambulatory Visit

## 2024-02-28 ENCOUNTER — Ambulatory Visit: Payer: Self-pay

## 2024-02-28 NOTE — Telephone Encounter (Signed)
 FYI Only or Action Required?: FYI only for provider: appointment scheduled on 12/29.  Patient was last seen in primary care on 04/23/2023 by Antonio Meth, Jamee SAUNDERS, DO.  Called Nurse Triage reporting Foot Swelling.  Symptoms began several months ago.  Interventions attempted: rest.  Symptoms are: unchanged.  Triage Disposition: See PCP When Office is Open (Within 3 Days)  Patient/caregiver understands and will follow disposition?: Yes   Copied from CRM #[id number]. Topic: Clinical - Red Word Triage >> Feb 28, 2024  9:33 AM Alfonso ORN wrote: Red Word that prompted transfer to Nurse Triage: pt had an accident 2 months ago, walking more than usual now swollen and painful , requesting an xray. Reason for Disposition  [1] After 3 days AND [2] pain not improved  Answer Assessment - Initial Assessment Questions 1. MECHANISM: How did the injury happen? (e.g., twisting injury, direct blow)      She was getting off a ladder and twisted her left foot 2 months ago, denies any fall, denies trauma  2. ONSET: When did the injury happen? (Minutes or hours ago)      2 months ago  3. LOCATION: Where is the injury located?      L foot  4. APPEARANCE of INJURY: What does the injury look like?      A bump on top of L foot, pt insists it is not due to swelling  5. WEIGHT-BEARING: Can you put weight on that foot? Can you walk (four steps or more)?       Yeah I've been walking, but reports walking for extended periods becomes painful  6. SIZE: For cuts, bruises, or swelling, ask: How large is it? (e.g., inches or centimeters;  entire joint)      Denies bruising and swelling  7. PAIN: Is there pain? If Yes, ask: How bad is the pain?    (e.g., Scale 1-10; or mild, moderate, severe)      Sore, 2/10 when not walking  Protocols used: Ankle and Foot Injury-A-AH

## 2024-03-02 ENCOUNTER — Ambulatory Visit: Admitting: Family

## 2024-03-02 ENCOUNTER — Ambulatory Visit (HOSPITAL_BASED_OUTPATIENT_CLINIC_OR_DEPARTMENT_OTHER)
Admission: RE | Admit: 2024-03-02 | Discharge: 2024-03-02 | Disposition: A | Discharge status: Home / Self Care | Source: Ambulatory Visit | Attending: Family | Admitting: Family

## 2024-03-02 ENCOUNTER — Ambulatory Visit: Payer: Self-pay | Admitting: Family

## 2024-03-02 ENCOUNTER — Encounter: Payer: Self-pay | Admitting: Family

## 2024-03-02 VITALS — BP 129/64 | HR 79 | Temp 99.1°F | Resp 16 | Ht 66.0 in | Wt 188.0 lb

## 2024-03-02 DIAGNOSIS — M7732 Calcaneal spur, left foot: Secondary | ICD-10-CM | POA: Diagnosis not present

## 2024-03-02 DIAGNOSIS — M19072 Primary osteoarthritis, left ankle and foot: Secondary | ICD-10-CM | POA: Diagnosis not present

## 2024-03-02 DIAGNOSIS — M79672 Pain in left foot: Secondary | ICD-10-CM | POA: Diagnosis not present

## 2024-03-02 DIAGNOSIS — M2012 Hallux valgus (acquired), left foot: Secondary | ICD-10-CM | POA: Diagnosis not present

## 2024-03-02 NOTE — Progress Notes (Signed)
 "  Acute Office Visit  Subjective:     Patient ID: Sharon Figueroa, female    DOB: [birth year], 63 y.o.   MRN: [id number]  Chief Complaint  Patient presents with   Foot Pain    Patient reports having left foot pain for about 2 months.     HPI Patient is in today with complaints of left foot pain that has been ongoing for about 2 months.  Reports she initially noticed it when she was getting down off bilaterally foot over.  Now, she notices it when she walks more.  She typically works from home and does not do much walking.  The pain is better when she elevates the foot or when she just wears better and she agrees.  Sneakers also make it worse.  Denies any redness.  But has mild swelling.  No previous injury.  Review of Systems  Constitutional: Negative.   Respiratory: Negative.    Cardiovascular: Negative.   Musculoskeletal:        Left foot pain and swelling  Neurological: Negative.   All other systems reviewed and are negative.  Past Medical History:  Diagnosis Date   Mitral valve disorder    leaky valve diagnosed 30+ yr ago    Social History   Socioeconomic History   Marital status: Married    Spouse name: Not on file   Number of children: 3   Years of education: Not on file   Highest education level: Not on file  Occupational History   Not on file  Tobacco Use   Smoking status: Former    Current packs/day: 0.00    Average packs/day: 1.0 packs/day    Types: Cigarettes    Quit date: 03/30/2017    Years since quitting: 6.9   Smokeless tobacco: Never  Vaping Use   Vaping status: Never Used  Substance and Sexual Activity   Alcohol use: No   Drug use: No   Sexual activity: Yes    Partners: Male  Other Topics Concern   Not on file  Social History Narrative   Exercise----- walking pad , daily    Social Drivers of Health   Tobacco Use: Medium Risk (05/29/2023)   Patient History    Smoking Tobacco Use: Former    Smokeless Tobacco Use: Never     Passive Exposure: Not on Actuary Strain: Not on file  Food Insecurity: Not on file  Transportation Needs: Not on file  Physical Activity: Not on file  Stress: Not on file  Social Connections: Not on file  Intimate Partner Violence: Not on file  Depression (PHQ2-9): Low Risk (04/23/2023)   Depression (PHQ2-9)    PHQ-2 Score: 0  Alcohol Screen: Not on file  Housing: Not on file  Utilities: Not on file  Health Literacy: Not on file    Past Surgical History:  Procedure Laterality Date   AORTIC VALVE REPLACEMENT N/A 04/04/2017   Procedure: AORTIC VALVE REPLACEMENT (AVR);  Surgeon: Lucas Dorise POUR, MD;  Location: [facility] OR;  Service: Open Heart Surgery;  Laterality: N/A;   BIOPSY  11/21/2018   Procedure: BIOPSY;  Surgeon: Saintclair Jasper, MD;  Location: [facility] ENDOSCOPY;  Service: Gastroenterology;;   CARDIAC SURGERY     COLONOSCOPY WITH PROPOFOL  N/A 11/21/2018   Procedure: COLONOSCOPY WITH PROPOFOL ;  Surgeon: Saintclair Jasper, MD;  Location: [facility] ENDOSCOPY;  Service: Gastroenterology;  Laterality: N/A;   ESOPHAGOGASTRODUODENOSCOPY N/A 11/20/2018   Procedure: ESOPHAGOGASTRODUODENOSCOPY (EGD);  Surgeon: Saintclair Jasper, MD;  Location: [facility] ENDOSCOPY;  Service: Gastroenterology;  Laterality: N/A;   HOT HEMOSTASIS N/A 11/20/2018   Procedure: HOT HEMOSTASIS (ARGON PLASMA COAGULATION/BICAP);  Surgeon: Saintclair Jasper, MD;  Location: [facility] ENDOSCOPY;  Service: Gastroenterology;  Laterality: N/A;   MAZE N/A 04/04/2017   Procedure: MAZE;  Surgeon: Lucas Dorise POUR, MD;  Location: [facility] OR;  Service: Open Heart Surgery;  Laterality: N/A;   MITRAL VALVE REPLACEMENT N/A 04/04/2017   Procedure: MITRAL VALVE (MV) REPLACEMENT;  Surgeon: Lucas Dorise POUR, MD;  Location: [facility] OR;  Service: Open Heart Surgery;  Laterality: N/A;   No prior surgery     POLYPECTOMY  11/21/2018   Procedure: POLYPECTOMY;  Surgeon: Saintclair Jasper, MD;  Location: [facility] ENDOSCOPY;  Service: Gastroenterology;;   RIGHT/LEFT HEART CATH AND CORONARY  ANGIOGRAPHY N/A 04/01/2017   Procedure: RIGHT/LEFT HEART CATH AND CORONARY ANGIOGRAPHY;  Surgeon: Court Dorn PARAS, MD;  Location: [facility] INVASIVE CV LAB;  Service: Cardiovascular;  Laterality: N/A;   TEE WITHOUT CARDIOVERSION N/A 04/04/2017   Procedure: TRANSESOPHAGEAL ECHOCARDIOGRAM (TEE);  Surgeon: Lucas Dorise POUR, MD;  Location: [facility] OR;  Service: Open Heart Surgery;  Laterality: N/A;    Family History  Problem Relation Age of Onset   Hypertension Mother    COPD Mother    Hypertension Father    CVA Father 74   Heart attack Father 43   Skin cancer Sister        melanoma    Allergies[1]  Medications Ordered Prior to Encounter[2]  BP 129/64 (BP Location: Left Arm, Patient Position: Sitting, Cuff Size: Large)   Pulse 79   Temp 99.1 F (37.3 C) (Oral)   Resp 16   Ht 5' 6 (1.676 m)   Wt 188 lb (85.3 kg)   SpO2 98%   BMI 30.34 kg/m chart      Objective:    BP 129/64 (BP Location: Left Arm, Patient Position: Sitting, Cuff Size: Large)   Pulse 79   Temp 99.1 F (37.3 C) (Oral)   Resp 16   Ht 5' 6 (1.676 m)   Wt 188 lb (85.3 kg)   SpO2 98%   BMI 30.34 kg/m    Physical Exam Vitals and nursing note reviewed.  Constitutional:      Appearance: Normal appearance.  Cardiovascular:     Rate and Rhythm: Normal rate and regular rhythm.  Pulmonary:     Effort: Pulmonary effort is normal.     Breath sounds: Normal breath sounds.  Musculoskeletal:        General: Swelling, tenderness, deformity and signs of injury present.     Comments: Open foot: Numbness to palpation to the dorsal aspect of the left foot.  No erythema.  Bone deformity noted  Skin:    General: Skin is warm and dry.  Neurological:     General: No focal deficit present.     Mental Status: She is alert and oriented to person, place, and time. Mental status is at baseline.  Psychiatric:        Mood and Affect: Mood normal.        Behavior: Behavior normal.        Thought Content: Thought content  normal.        Judgment: Judgment normal.    No results found for any visits on 03/02/24.      Assessment & Plan:   Problem List Items Addressed This Visit   None Visit Diagnoses       Acute pain of left foot    -  Primary  Relevant Orders   DG Foot Complete Left       No orders of the defined types were placed in this encounter. As if symptoms worsen or persist.  Will follow-up pending the results of her x-ray.  Recheck as scheduled and sooner as needed.  No follow-ups on file.  Emagene Merfeld B Minal Stuller, FNP       [1] No Known Allergies [2] Current Outpatient Medications on File Prior to Visit  Medication Sig Dispense Refill   acetaminophen  (TYLENOL ) 325 MG tablet Take 2 tablets (650 mg total) by mouth every 6 (six) hours as needed for mild pain.     aspirin  EC 81 MG tablet Take 1 tablet (81 mg total) by mouth daily. 90 tablet 3   enoxaparin  (LOVENOX ) 120 MG/0.8ML injection Inject 0.8 mLs (120 mg total) into the skin daily. 2.4 mL 0   losartan  (COZAAR ) 50 MG tablet Take 1 tablet (50 mg total) by mouth daily. 90 tablet 3   metoprolol  succinate (TOPROL -XL) 25 MG 24 hr tablet TAKE 1 TABLET (25 MG TOTAL) BY MOUTH DAILY. 90 tablet 3   pantoprazole  (PROTONIX ) 40 MG tablet Take 1 tablet (40 mg total) by mouth 3 (three) times a week. 60 tablet 1   warfarin (COUMADIN ) 5 MG tablet Take 1 to 1&1/2 tablets by mouth daily as directed by the coumadin  clinic. 120 tablet 1   No current facility-administered medications on file prior to visit.  "

## 2024-03-03 ENCOUNTER — Other Ambulatory Visit: Payer: Self-pay | Admitting: Family

## 2024-03-03 DIAGNOSIS — M79672 Pain in left foot: Secondary | ICD-10-CM

## 2024-03-19 ENCOUNTER — Ambulatory Visit: Admitting: Orthopedic Surgery

## 2024-04-08 ENCOUNTER — Other Ambulatory Visit: Payer: Self-pay | Admitting: Cardiology

## 2024-06-12 ENCOUNTER — Encounter
